# Patient Record
Sex: Male | Born: 1935 | Race: White | Hispanic: No
Health system: Midwestern US, Community
[De-identification: ages and names within clinical notes are randomized; demographics above are authoritative.]

## PROBLEM LIST (undated history)

## (undated) DIAGNOSIS — I2510 Atherosclerotic heart disease of native coronary artery without angina pectoris: Secondary | ICD-10-CM

## (undated) DIAGNOSIS — M1990 Unspecified osteoarthritis, unspecified site: Secondary | ICD-10-CM

## (undated) DIAGNOSIS — I10 Essential (primary) hypertension: Secondary | ICD-10-CM

## (undated) DIAGNOSIS — E785 Hyperlipidemia, unspecified: Secondary | ICD-10-CM

## (undated) DIAGNOSIS — E119 Type 2 diabetes mellitus without complications: Secondary | ICD-10-CM

## (undated) DIAGNOSIS — K219 Gastro-esophageal reflux disease without esophagitis: Secondary | ICD-10-CM

## (undated) DIAGNOSIS — M47816 Spondylosis without myelopathy or radiculopathy, lumbar region: Secondary | ICD-10-CM

## (undated) DIAGNOSIS — M48062 Spinal stenosis, lumbar region with neurogenic claudication: Secondary | ICD-10-CM

## (undated) DIAGNOSIS — M48061 Spinal stenosis, lumbar region without neurogenic claudication: Secondary | ICD-10-CM

## (undated) DIAGNOSIS — M179 Osteoarthritis of knee, unspecified: Secondary | ICD-10-CM

## (undated) DIAGNOSIS — R52 Pain, unspecified: Secondary | ICD-10-CM

## (undated) DIAGNOSIS — M4722 Other spondylosis with radiculopathy, cervical region: Secondary | ICD-10-CM

## (undated) DIAGNOSIS — M542 Cervicalgia: Secondary | ICD-10-CM

---

## 2011-10-14 NOTE — Progress Notes (Signed)
Attempted to contact patient.  No answer.  Message left at 11:40 AM on 10/14/2011.

## 2011-10-15 MED ORDER — LACTATED RINGERS IV SOLN
INTRAVENOUS | Status: DC
Start: 2011-10-15 — End: 2011-10-16

## 2011-10-15 MED ORDER — LIDOCAINE HCL 1 % (PF) IJ SOLN 2 ML
1 % (PF) | Freq: Once | INTRAMUSCULAR | Status: DC
Start: 2011-10-15 — End: 2011-10-16

## 2011-10-15 NOTE — Discharge Instructions (Signed)
[facility]          [phone]             [facility]          [phone]         [facility]          [phone]  POST EPIDURAL STEROID INJECTION    PATIENT INSTRUCTIONS:  1)  DIET   .  RESUME NORMAL DIET   .  RESUME ALL PRIOR MEDICATIONS  2)  ACTIVITY  *  DO NOT STAY ALONE FOR 4-6 HOURS AFTER THE PROCEDURE  .  REST TODAY  * DO NOT DRIVE TODAY AND 24 HOURS IF YOU RECEIVED SEDATION.  IF YOU ARE   SEEN DRIVING DURING THIS TIME THE PROPER AUTHORITIES WILL BE NOTIFIED.  * DO NOT SIGN ANY LEGAL DOCUMENTS, MAKE ANY MAJOR DECISIONS, OR BE INVOLVED IN WORK DECISIONS FOR THE REMAINDER OF THE DAY.   Marland Kitchen  RESUME NORMAL ACTIVITIES.  DO NOT OVER EXERT YOURSELF.  .  SHOWER OR BATHE AS NORMAL  3)  SITE CARE   .  MAY USE ICE TO SITE IF NEEDED   *  KEEP SITE DRY FOR 12 HOURS.  REMOVE BAND-AID THE FOLLOWING DAY  .  OBSERVE PUNCTURE SITE FOR SIGNS OF INFECTION (REDNESS, WARMTH, SWELLING, PURULENT DRAINAGE, INCREASED TENDERNESS OR FEVER GREATER THAN 101)  .  REPORT SIGNS OF INFECTION TO THE PHYSICIAN  4)  EXPECTED SIDE EFFECTS   .  FLUID RETENTION   .  NERVOUS ENERGY   .  SLEEPLESSNESS   .  MUSCLE SPASMS  * TEMPORARY WEAKNESS/TINGLING/NUMBNESS IN THE EXTREMITIES   . PAIN AT INJECTION SITE   .  DROWSINESS  .  POSSIBLE ELEVATION OF BLOOD SUGARS IF YOU ARE DIABETIC  5)  TO REACH DR. VALENTIN  *  CALL IF SYMPTOMS WORSEN SEVERELY OR FOR A SEVERE HEADACHE THAT WORSENS WHEN UPRIGHT     .  CALL IF YOU DEVELOP A FEVER GREATER THAN 101  .  CALL [phone] TO MAKE A FOLLOW UP APPOINTMENT WITH DR. Eduardo Osier   .  CALL REFERRING DOCTOR IF ANY PROBLEMS   6)  ADDITIONAL INSTRUCTIONS  .  IT MAY TAKE 24 TO 36 HOURS TO FEEL IMPROVEMENT    I HAVE RECEIVED AND UNDERSTAND THESE INSTRUCTIONS:   [facility]          [phone]  POST EPIDURAL STEROID INJECTION    PATIENT INSTRUCTIONS:  1)  DIET   .  RESUME NORMAL DIET   .  RESUME ALL PRIOR  MEDICATIONS  2)  ACTIVITY  *  DO NOT STAY ALONE FOR 4-6 HOURS AFTER THE PROCEDURE  .  REST TODAY  * DO NOT DRIVE TODAY AND 24 HOURS IF YOU RECEIVED SEDATION.  IF YOU ARE   SEEN DRIVING DURING THIS TIME THE PROPER AUTHORITIES WILL BE NOTIFIED.  * DO NOT SIGN ANY LEGAL DOCUMENTS, MAKE ANY MAJOR DECISIONS, OR BE INVOLVED IN WORK DECISIONS FOR THE REMAINDER OF THE DAY.   Marland Kitchen  RESUME NORMAL ACTIVITIES.  DO NOT OVER EXERT YOURSELF.  .  SHOWER OR BATHE AS NORMAL  3)  SITE CARE   .  MAY USE ICE TO SITE IF NEEDED   *  KEEP SITE DRY FOR 12 HOURS.  REMOVE BAND-AID THE FOLLOWING DAY  .  OBSERVE PUNCTURE SITE FOR SIGNS OF INFECTION (REDNESS, WARMTH, SWELLING,  PURULENT DRAINAGE, INCREASED TENDERNESS OR FEVER GREATER THAN 101)  .  REPORT SIGNS OF INFECTION TO THE PHYSICIAN  4)  EXPECTED SIDE EFFECTS   .  FLUID RETENTION   .  NERVOUS ENERGY   .  SLEEPLESSNESS   .  MUSCLE SPASMS  * TEMPORARY WEAKNESS/TINGLING/NUMBNESS IN THE EXTREMITIES   . PAIN AT INJECTION SITE   .  DROWSINESS  .  POSSIBLE ELEVATION OF BLOOD SUGARS IF YOU ARE DIABETIC  5)  TO REACH DR. VALENTIN  *  CALL IF SYMPTOMS WORSEN SEVERELY OR FOR A SEVERE HEADACHE THAT WORSENS WHEN UPRIGHT     .  CALL IF YOU DEVELOP A FEVER GREATER THAN 101  .  CALL [phone] TO MAKE A FOLLOW UP APPOINTMENT WITH DR. Eduardo OsierVALENTIN   .  CALL REFERRING DOCTOR IF ANY PROBLEMS   6)  ADDITIONAL INSTRUCTIONS  .  IT MAY TAKE 24 TO 36 HOURS TO FEEL IMPROVEMENT    I HAVE RECEIVED AND UNDERSTAND THESE INSTRUCTIONS:

## 2011-10-15 NOTE — Progress Notes (Signed)
1354 Report received from T. Deavers. VSS. Denies complaint of pain. Cervical site is unremarkable. Declined offer for drink. Resting. Will continue to monitor for 30 min per protocol.

## 2011-10-15 NOTE — H&P (Signed)
HISTORY AND PHYSICAL/PRE-SEDATION ASSESSMENT    Patient:  Jordan Gutierrez, Jordan Gutierrez   DOB:  [birth year]  Medical Record No.:  [id number]   Date:  10/15/2011  Physician:  Donnal Moat, M.D.    Nursing History and Physical reviewed and agreed upon.      Additional findings:    Allergies:  Review of patient's allergies indicates no known allergies.    Home Medications:    Prior to Admission medications    Medication Sig Start Date End Date Taking? Authorizing Provider   METFORMIN HCL PO Take  by mouth. TAKE 2 TABLETS TWICE A DAY    Yes Historical Provider, MD   escitalopram (LEXAPRO) 10 MG tablet Take 10 mg by mouth daily.     Yes Historical Provider, MD   ZOLPIDEM TARTRATE PO Take  by mouth nightly.     Yes Historical Provider, MD       Vitals:  BP 137/73  Pulse 79  Temp(Src) 96.8 Demitrios Molyneux (36 C) (Temporal)  Resp 16  Ht 5\' 10"  (1.778 m)  Wt 185 lb (83.915 kg)  BMI 26.54 kg/m2  SpO2 95%      PHYSICAL EXAM:  HENT: Airway patent and reviewed  Cardiovascular: Normal rate, regular rhythm, normal heart sounds.   Pulmonary/Chest: No wheezes. No rhonchi. No rales.   Abdominal: Soft. Bowel sounds are normal. No distension.    ASA CLASS:         []    I. Normal, healthy adult           [x]    II.  Mild systemic disease            []    III.  Severe systemic disease      Sedation plan:   [x]   Local              []   Minimal                  []   General anesthesia    Patient's condition acceptable for planned procedure/sedation.   Post Procedure Plan   Return to same level of care   ______________________     The risks and benefits as well as alternatives to the procedure have been discussed with the patient and or family.  The patient and or next of kin understands and agrees to proceed.    Donnal Moat, M.D.

## 2011-10-15 NOTE — Op Note (Signed)
Patient:  Jordan, Gutierrez   Medical Record #:  [id number]   Date:  10/15/2011  Physician:  Donnal Moat, M.D.    Pre-op diagnosis:  Cervical spondylosis  Post-op diagnosis:  same  Procedure:   Left C7/T1 cervical interlaminar epidural injection with flouroscopic guidance  Anesthesia: local  Procedure Note:    The patient was admitted through pre-op and written consent was obtained.  The patient was advised of the risks and benefits of the procedure, including but not limited to the following: bleeding, pain, infection, temporary paralysis, nerve damage and spinal headache.  The patient was given the opportunity to ask questions.  There were no contraindications for this procedure.    The appropriate area was prepped and draped in a sterile fashion.  Landmarks were identified and marked.  The skin and soft tissues were anesthetized with 1% lidocaine.  A 22G 3.5inch Touhy needle was advanced to the C7/T1 interlaminar space using fluoroscopic guidance with ideal needle tip confirmed by multiple views.  Injection of contrast showed appropriate needle placement.  There were no signs of intravascular or intrathecal injection.    12 mg celestone were then injected.     There were no complications and the patient tolerated the procedure well.  The patient was transferred to the recovery area and monitored.  Discharge instructions were given.  The patient is to contact me for any post-procedure concerns.  The patient is to follow up as scheduled.    LOR 2cm    Donnal Moat, MD

## 2011-10-15 NOTE — Progress Notes (Addendum)
Carey Jelus RT        [address]      Celestone 2ml  Lidocaine 1% plain 2ml  Omnipaque 240mg /ml  2ml

## 2012-02-26 NOTE — Patient Instructions (Signed)
PATIENT INSTRUCTED TO FOLLOW DR Shelly Rubenstein INSTRUCTIONS

## 2012-03-03 LAB — POCT GLUCOSE: POC Glucose: 144 mg/dl — ABNORMAL HIGH (ref 70–99)

## 2012-03-03 MED ORDER — BETAMETHASONE SOD PHOS & ACET 6 (3-3) MG/ML IJ SUSP
6 (3-3) MG/ML | INTRAMUSCULAR | Status: AC
Start: 2012-03-03 — End: ?

## 2012-03-03 MED FILL — CELESTONE SOLUSPAN 6 (3-3) MG/ML IJ SUSP: 6 (3-3) MG/ML | INTRAMUSCULAR | Qty: 5

## 2012-03-03 NOTE — Progress Notes (Signed)
NURSING CARE PLANS FOLLOWED        Potential for anxiety-decrease anxiety-allow patient to verbalize  Potential for infection-no infection-proper infection controls  Potential for fall-no fall-assess for drowsiness/ numbness.  Potential for deep sedation-no deep sedation-know maximum allowable dose, adverse reactions, and nursing considerations. Assess VS and LOC

## 2012-03-03 NOTE — Op Note (Signed)
Patient:  Jordan Gutierrez, Jordan Gutierrez   Medical Record #:  [id number]   Date:  03/03/2012  Physician:  Donnal Moat, M.D.    Pre-op diagnosis:  Cervical spondylosis, cervical radiculopathy  Post-op diagnosis:  same  Procedure:   C7/T1 cervical interlaminar epidural injection with flouroscopic guidance  Anesthesia: local  Procedure Note:    The patient was admitted through pre-op and written consent was obtained.  The patient was advised of the risks and benefits of the procedure, including but not limited to the following: bleeding, pain, infection, temporary paralysis, nerve damage and spinal headache.  The patient was given the opportunity to ask questions.  There were no contraindications for this procedure.    The appropriate area was prepped and draped in a sterile fashion.  Landmarks were identified and marked.  The skin and soft tissues were anesthetized with 1% lidocaine.  A 22G 3.5inch Touhy needle was advanced to the C7/T1 interlaminar space using fluoroscopic guidance with ideal needle tip confirmed by multiple views.  Injection of contrast showed appropriate needle placement.  There were no signs of intravascular or intrathecal injection.    12 mg celestone were then injected.     There were no complications and the patient tolerated the procedure well.  The patient was transferred to the recovery area and monitored.  Discharge instructions were given.  The patient is to contact me for any post-procedure concerns.  The patient is to follow up as scheduled.    LOR 3 cm    Donnal Moat, MD

## 2012-03-03 NOTE — Progress Notes (Signed)
Discharged home via ambulatory in good condition.  Verbalizes understanding of discharge instructions and written instructions also given to patient.  No changes to previous assessment.  Instructions To driver .

## 2012-03-03 NOTE — Discharge Instructions (Signed)
[facility]          [phone]  POST EPIDURAL STEROID INJECTION    PATIENT INSTRUCTIONS:  1)  DIET   .  RESUME NORMAL DIET   .  RESUME ALL PRIOR MEDICATIONS  2)  ACTIVITY  *  DO NOT STAY ALONE FOR 4-6 HOURS AFTER THE PROCEDURE  .  REST TODAY  * DO NOT DRIVE TODAY AND 24 HOURS IF YOU RECEIVED SEDATION.  IF YOU ARE   SEEN DRIVING DURING THIS TIME THE PROPER AUTHORITIES WILL BE NOTIFIED.  * DO NOT SIGN ANY LEGAL DOCUMENTS, MAKE ANY MAJOR DECISIONS, OR BE INVOLVED IN WORK DECISIONS FOR THE REMAINDER OF THE DAY.   .  RESUME NORMAL ACTIVITIES.  DO NOT OVER EXERT YOURSELF.  .  SHOWER OR BATHE AS NORMAL  3)  SITE CARE   .  MAY USE ICE TO SITE IF NEEDED   *  KEEP SITE DRY FOR 12 HOURS.  REMOVE BAND-AID THE FOLLOWING DAY  .  OBSERVE PUNCTURE SITE FOR SIGNS OF INFECTION (REDNESS, WARMTH, SWELLING, PURULENT DRAINAGE, INCREASED TENDERNESS OR FEVER GREATER THAN 101)  .  REPORT SIGNS OF INFECTION TO THE PHYSICIAN  4)  EXPECTED SIDE EFFECTS   .  FLUID RETENTION   .  NERVOUS ENERGY   .  SLEEPLESSNESS   .  MUSCLE SPASMS  * TEMPORARY WEAKNESS/TINGLING/NUMBNESS IN THE EXTREMITIES   . PAIN AT INJECTION SITE   .  DROWSINESS  .  POSSIBLE ELEVATION OF BLOOD SUGARS IF YOU ARE DIABETIC  5)  TO REACH DR. VALENTIN  *  CALL IF SYMPTOMS WORSEN SEVERELY OR FOR A SEVERE HEADACHE THAT WORSENS WHEN UPRIGHT     .  CALL IF YOU DEVELOP A FEVER GREATER THAN 101  .  CALL [phone] TO MAKE A FOLLOW UP APPOINTMENT WITH DR. VALENTIN   .  CALL REFERRING DOCTOR IF ANY PROBLEMS   6)  ADDITIONAL INSTRUCTIONS  .  IT MAY TAKE 24 TO 36 HOURS TO FEEL IMPROVEMENT    I HAVE RECEIVED AND UNDERSTAND THESE INSTRUCTIONS:

## 2012-03-03 NOTE — Progress Notes (Signed)
Pt arrived in pacu on cart , denies pain or numbness and tingling in lower extremities. Received report from Helane Gunther RN. Admitted to phase 2

## 2012-03-03 NOTE — H&P (Signed)
HISTORY AND PHYSICAL/PRE-SEDATION ASSESSMENT    Patient:  Jordan Gutierrez, Jordan Gutierrez   DOB:  1929/11/01  Medical Record No.:  [id number]   Date:  03/03/2012  Physician:  Donnal Moat, M.D.    Nursing History and Physical reviewed and agreed upon.      Additional findings:    Allergies:  Review of patient's allergies indicates no known allergies.    Home Medications:    Prior to Admission medications    Medication Sig Start Date End Date Taking? Authorizing Provider   fentaNYL (DURAGESIC) 50 MCG/HR Place 1 patch onto the skin every 72 hours.   Yes Historical Provider, MD   finasteride (PROSCAR) 5 MG tablet Take 5 mg by mouth daily.   Yes Historical Provider, MD   METFORMIN HCL PO Take  by mouth. TAKE 2 TABLETS TWICE A DAY    Yes Historical Provider, MD   escitalopram (LEXAPRO) 10 MG tablet Take 10 mg by mouth daily.     Yes Historical Provider, MD   ZOLPIDEM TARTRATE PO Take  by mouth nightly.     Yes Historical Provider, MD       Vitals:  BP 134/83   Pulse 81   Temp(Src) 98.2 ??Eryck Negron (36.8 ??C) (Temporal)   Resp 18   Ht 5\' 10"  (1.778 m)   Wt 180 lb (81.647 kg)   BMI 25.83 kg/m2   SpO2 96%      PHYSICAL EXAM:  HENT: Airway patent and reviewed  Cardiovascular: Normal rate, regular rhythm, normal heart sounds.   Pulmonary/Chest: No wheezes. No rhonchi. No rales.   Abdominal: Soft. Bowel sounds are normal. No distension.    ASA CLASS:         []    I. Normal, healthy adult           [x]    II.  Mild systemic disease            []    III.  Severe systemic disease      Sedation plan:   [x]   Local              []   Minimal                  []   General anesthesia    Patient's condition acceptable for planned procedure/sedation.   Post Procedure Plan   Return to same level of care   ______________________     The risks and benefits as well as alternatives to the procedure have been discussed with the patient and or family.  The patient and or next of kin understands and agrees to proceed.    Donnal Moat, M.D.

## 2012-03-03 NOTE — Progress Notes (Addendum)
Celestone 6mg /ml-12mg /51ml  Omnipaque 240-69ml    RT-C Jelus  ST-T [facility]

## 2012-09-09 MED ORDER — FENTANYL 50 MCG/HR TD PT72
50 MCG/HR | MEDICATED_PATCH | TRANSDERMAL | Status: DC
Start: 2012-09-09 — End: 2013-09-15

## 2012-09-09 MED ORDER — GABAPENTIN 300 MG PO CAPS
300 MG | ORAL_CAPSULE | ORAL | Status: DC
Start: 2012-09-09 — End: 2015-01-03

## 2012-09-09 MED ORDER — HYDROCODONE-ACETAMINOPHEN 7.5-325 MG PO TABS
ORAL_TABLET | Freq: Four times a day (QID) | ORAL | Status: DC | PRN
Start: 2012-09-09 — End: 2013-09-15

## 2012-09-09 NOTE — Progress Notes (Signed)
Follow up: SPINE    CHIEF COMPLAINT:    Chief Complaint   Patient presents with   ??? Neck Pain       HISTORY OF PRESENT ILLNESS:                The patient is a 78 y.o. male here to follow up medication maintenance .  Pain left posterior shoulder and neck.  Pain level 6/10 with medication, 10/10 without.  Asking if he can go up on the hydrocodone.  No significant side effects.  Also on gabapentin 300 q. Day for   foot issue    Meds: Duragesic 50 mcg patch q.48 hours, Vicodin 7.5 mg q.6 hours p.r.n. pain    Pain location: left neck and shoulder  Function: still walks independently and functions in the community  Pain Scale: 1-10  With Meds 6   Pain Scale: 1-10 Without Meds 10    Side Effects:     Past Medical History:   Past Medical History   Diagnosis Date   ??? Diabetes mellitus    ??? Cancer      PROSTATE CANCER   ??? Pancreatitis 10/15/11        REVIEW OF SYSTEMS:   CONSTITUTIONAL: Denies unexplained weight loss, fevers, chills or fatigue  NEUROLOGIC: Denies tremors or seizures         PHYSICAL EXAM:    Vitals: Blood pressure 130/80, pulse 80, height 5\' 10"  (1.778 m), weight 180 lb (81.647 kg).    GENERAL EXAM:  ?? General Apparence: Patient is adequately groomed with no evidence of malnutrition.  ?? Orientation: The patient is oriented to time, place and person.   ?? Mood & Affect:The patient's mood and affect are appropriate   ?? Gait & station: normal, patient ambulates without assistance  ?? Vascular: Examination reveals no swelling tenderness in upper or lower extremities.    ?? Lymphatic: The lymphatic examination bilaterally reveals all areas to be without enlargement or induration  ?? Sensation: Sensation is intact without deficit  ?? Coordination/Balance: Good coordination     CERVICAL EXAMINATION:  ?? Inspection: Local inspection shows no step-off or bruising.  Cervical alignment is normal.     ?? Palpation: No evidence of tenderness at the midline, and trapezius.  Paraspinal tenderness is present. There is no step-off  or paraspinal spasm.   ?? Range of Motion: moderate loss of  ?? Strength: 5/5 bilateral upper extremities   ?? Special Tests:    ??   Spurling's, L'Hermitte's & Hoffman's negative bilaterally.   ??   Hawkins and Impingement tests are negative bilaterally.   ??  Cubital and Carpal tunnel Tinel's negative bilaterally.      ?? Skin:There are no rashes, ulcerations or lesions in right & left upper extremities.  ?? Reflexes: Bilaterally triceps, biceps and brachioradialis are 2+.  Clonus absent bilaterally at the feet.   ?? Gait & station: normal, patient ambulates without assistance     ?? Additional Examinations:  Moderate loss of shoulder flexion and abduction      ?? RIGHT UPPER EXTREMETY:  Inspection/examination of the right upper extremity does not show any tenderness, deformity or injury.  There is no gross instability.  There are no rashes, ulcerations or lesions. Strength and tone are normal.  ?? LEFT UPPER EXTREMETY: Inspection/examination of the left upper extremity does not show any tenderness, deformity or injury.  There is no gross instability.  There are no rashes, ulcerations or lesions. Strength and tone are normal.  Diagnostic Testing:   Cervical MRI noted and reviewed in chart from 2004 showing multilevel spondylosis, right shoulder films reviewed showing a.c. arthropathy    Impression:  1) cervical spondylosis and left mechanical neck pain  Bilateral rotator cuff  2) opiate management-mild risk      Plan:  1) increase gabapentin to 300-600 mg q.6 hours  Duragesic 50 mcg patch q.a 48 hours can dispense#15 with 3 separate one month prescriptions  Vicodin 7.5 mg one q.i.d. P.r.n. Breakthrough pain 120 with 2 refills    The risks and use of maintenance opiate medication were reviewed.  These include the risk of tolerance, addition, and abuse.  Potential side effects were also discussed.   Medications are to be taken as prescribed and not escalated without prior agreement.   OARRS/KASPER reviewed.   Opiate  medications will only be prescribed through the office of Dr Carmon Ginsberg. Donato Schultz, M. D. unless notified otherwise.

## 2012-09-09 NOTE — Patient Instructions (Signed)
Continue medications, follow up 3 months

## 2012-09-16 NOTE — Progress Notes (Signed)
Chief Complaint  Shoulder Pain  right    History of Present Illness:  Jordan Gutierrez is a 78 y.o. male    Medical History  Patient's medications, allergies, past medical, surgical, social and family histories were reviewed and updated as appropriate.    Review of Systems  Pertinent items are noted in HPI.  Relevant review of systems reviewed and available in the patient's chart    Vital Signs  Filed Vitals:    09/16/12 1337   BP: 122/78   Pulse: 82       General Exam:   Constitutional: Patient is adequately groomed with no evidence of malnutrition    Shoulder Examination  Inspection:  No deformities    Palpation:  No significant pain to palpation    Rang of Motion:  Full passive range of motion active range of motion is to 120 without pain    Strength:  His strength is actually very good with supraspinatus isolation internal and external rotation against resistance    Special Tests:  Unremarkable    Skin: There are no rashes, ulcerations or lesions.    Additional Comments:     Additional Examinations:  Left Upper Extremity: Examination of the left upper extremity does not show any tenderness, deformity or injury.  Range of motion is unremarkable.  There is no gross instability.  There are no rashes, ulcerations or lesions.  Strength and tone are normal.      Diagnostic Test Findings:      Assessment:  Subacromial impingement osteoarthritis    Impression: Same    Office Procedures:  No orders of the defined types were placed in this encounter.       Treatment Plan:  As tolerated followup p.r.n.

## 2012-09-30 NOTE — Progress Notes (Addendum)
Chief Complaint  Shoulder Pain  right    History of Present Illness:  Jordan Gutierrez is a 78 y.o. male    Medical History  Patient's medications, allergies, past medical, surgical, social and family histories were reviewed and updated as appropriate.    Review of Systems  Pertinent items are noted in HPI.  Relevant review of systems reviewed and available in the patient's chart    Vital Signs  There were no vitals filed for this visit.    General Exam:   Constitutional: Patient is adequately groomed with no evidence of malnutrition    Shoulder Examination  Inspection:  No swelling no deformity    Palpation:  Pain lateral deltoid and anterior    Rang of Motion:  Good active and passive range of motion with crepitus    Strength:  His strength is actually very good with internal/external rotation against resistance and supraspinatus isolation against resistance    Special Tests:  Positive Neer positive Hawkins    Skin: There are no rashes, ulcerations or lesions.    Additional Comments:     Additional Examinations:  Left Upper Extremity: Examination of the left upper extremity does not show any tenderness, deformity or injury.  Range of motion is unremarkable.  There is no gross instability.  There are no rashes, ulcerations or lesions.  Strength and tone are normal.      Diagnostic Test Findings:      Assessment:  Right shoulder impingement    Impression: Same    Office Procedures:  No orders of the defined types were placed in this encounter.       Treatment Plan:  Today under sterile conditions and at all prepped and Betadine prep I injected his right shoulder with 3cc of Celestone and 7cc of Marcaine 0.5% he tolerated this very well.

## 2012-09-30 NOTE — Progress Notes (Signed)
09/30/2012 3:59 PM         NDC: [id number]    Lot Number: [id number]    Comments:

## 2012-12-17 MED ORDER — HYDROCODONE-ACETAMINOPHEN 7.5-325 MG PO TABS
ORAL_TABLET | Freq: Four times a day (QID) | ORAL | Status: DC | PRN
Start: 2012-12-17 — End: 2013-09-15

## 2012-12-17 MED ORDER — FENTANYL 50 MCG/HR TD PT72
50 MCG/HR | MEDICATED_PATCH | TRANSDERMAL | Status: DC
Start: 2012-12-17 — End: 2013-09-15

## 2012-12-17 NOTE — Progress Notes (Signed)
Follow up: SPINE    CHIEF COMPLAINT:  Chronic neck and shoulder pain  Chief Complaint   Patient presents with   ??? Neck Pain       HISTORY OF PRESENT ILLNESS:                The patient is a 78 y.o. male here to follow up medication maintenance for chronic left posterior shoulder and neck pain.  He describes episodic aching left neck and posterior shoulder pain increased with prolonged activity/ROM.  Substantial relief with Duragesic-50 q48 & Norco 7.5/325 QID.  His PCP he recently discontinued gabapentin and has started him on Lyrica 50 BID.  He feels that he is much more functional with the medication.  He denies any side effects.  Currently denies any radiating arm pain, no numbness tingling or progressive weakness. Symptoms are stable     Past Medical History:   Past Medical History   Diagnosis Date   ??? Diabetes mellitus    ??? Cancer      PROSTATE CANCER   ??? Pancreatitis 10/15/11        REVIEW OF SYSTEMS:   CONSTITUTIONAL: Denies unexplained weight loss, fevers, chills or fatigue  NEUROLOGIC: Denies tremors or seizures         PHYSICAL EXAM:    Vitals: Blood pressure 165/97, pulse 77, height 5\' 10"  (1.778 m), weight 180 lb (81.647 kg).    GENERAL EXAM:  ?? General Apparence: Patient is adequately groomed with no evidence of malnutrition.  ?? Orientation: The patient is oriented to time, place and person.   ?? Mood & Affect:The patient's mood and affect are appropriate   ?? Gait & station: normal, patient ambulates without assistance  ?? Vascular: Examination reveals no swelling tenderness in upper or lower extremities.   ?? Lymphatic: The lymphatic examination bilaterally reveals all areas to be without enlargement or induration  ?? Sensation: Sensation is intact without deficit  ?? Coordination/Balance: Good coordination     CERVICAL EXAMINATION:  ?? Inspection: Local inspection shows no step-off or bruising.  Cervical alignment is normal.     ?? Palpation: No evidence of tenderness at the midline, and trapezius.   Paraspinal tenderness is present. There is no step-off or paraspinal spasm.   ?? Range of Motion: mildly limited flexion, very limited extension   ?? Strength: 5/5 bilateral upper extremities   ?? Special Tests:    ??   Spurling's, L'Hermitte's & Hoffman's negative bilaterally.   ??   Hawkins and Impingement tests mildly positive left.   ??  Cubital and Carpal tunnel Tinel's negative bilaterally.      ?? Skin:There are no rashes, ulcerations or lesions in right & left upper extremities.  ?? Reflexes: Bilaterally triceps, biceps and brachioradialis are 1+.  Clonus absent bilaterally at the feet.   ?? Gait & station: normal, patient ambulates without assistance     ?? Additional Examinations:         ?? RIGHT UPPER EXTREMETY:  Inspection/examination of the right upper extremity does not show any tenderness, deformity or injury. Range of motion is unremarkable. There is no gross instability.  There are no rashes, ulcerations or lesions. Strength and tone are normal.  ?? LEFT UPPER EXTREMETY: Inspection/examination of the left upper extremity does not show any tenderness, deformity or injury. Range of motion is unremarkable. There is no gross instability.  There are no rashes, ulcerations or lesions. Strength and tone are normal.    Diagnostic Testing:   Cervical  MRI noted in chart from 2004 showing multilevel spondylosis    Impression:  1) chronic left mechanical neck pain, multilevel spondylosis  2) chronic shoulder pain, s/p sx  3) H/o prostate cancer  4) Opioid maintenance     Plan:  1) refill Duragesic 50 mcg patch apply 1 q48hrs #15, 3 separate one month scripts given  2) refill Norco 7.5/325 i po QID #120 with 2 refills  3) The risks and use of maintenance opiate medication were reviewed.  These include the risk of tolerance, addition, and abuse.  Potential side effects were also discussed.   Medications are to be taken as prescribed and not escalated without prior agreement.  OARRS/KASPER reviewed.   Opiate medications will  only be prescribed through the office of Dr Carmon Ginsberg. Donato Schultz, M. D. unless notified otherwise  4) Cayton Cuevas/u 73mo for medication     Marene Lenz, PA-C scribing for Dr. Eduardo Osier

## 2013-01-04 NOTE — Addendum Note (Signed)
Addended by: Wende Mott on: 01/04/2013 02:45 PM     Modules accepted: Orders

## 2013-01-05 NOTE — Progress Notes (Signed)
[facility] NDC number is 0517-0720-01

## 2013-02-10 MED ORDER — TRAMADOL-ACETAMINOPHEN 37.5-325 MG PO TABS
37.5-325 MG | ORAL_TABLET | Freq: Three times a day (TID) | ORAL | Status: DC | PRN
Start: 2013-02-10 — End: 2013-09-15

## 2013-02-10 NOTE — Addendum Note (Signed)
Addended byDonnie Mesa on: 02/10/2013 04:40 PM     Modules accepted: Orders

## 2013-02-10 NOTE — Progress Notes (Signed)
Chief Complaint  Shoulder Pain  right    History of Present Illness:  Jordan Gutierrez is a 79 y.o. male    Medical History  Patient's medications, allergies, past medical, surgical, social and family histories were reviewed and updated as appropriate.    Review of Systems  Pertinent items are noted in HPI.  Relevant review of systems reviewed and available in the patient's chart    Vital Signs  Filed Vitals:    02/10/13 1502   BP: 119/80       General Exam:   Constitutional: Patient is adequately groomed with no evidence of malnutrition    Shoulder Examination  Inspection:  No deformity no swelling    Palpation:  Some mild deltoid pain to palpation    Rang of Motion:  Passive motion to 130 active to 120 external rotation is limited to about 75??    Strength:  Good strength of internal and external rotation against resistance supraspinatus isolation is weaker on the right than the left mostly because of pain    Special Tests:  Negative Neer positive Hawkins radial ulnar and median nerve function is intact capillary refill is brisk sensation is intact I don't think he has a full-thickness rotator cuff tear    Skin: There are no rashes, ulcerations or lesions.    Additional Comments:     Additional Examinations:  Left Upper Extremity: Examination of the left upper extremity does not show any tenderness, deformity or injury.  Range of motion is unremarkable.  There is no gross instability.  There are no rashes, ulcerations or lesions.  Strength and tone are normal.      Diagnostic Test Findings:      Assessment:  Right shoulder subacromial impingement possible small rotator cuff tear    Impression: Same    Office Procedures:I discussed in detail the risks, benefits and complications of an injection which included but are not limited to infection, skin reactions, hot swollen joint, and anaphylaxis with the patient. The patient verbalized understanding and gave informed consent for the injection. The patient's skin was  prepped using sterile alcohol solution. A sterile 22-gauge needle was inserted into the subacromial space of the right shoulder and a mixture of 3ml of 6mg /ml [facility], 7mL of 0.5% Marcaine  was injected under sterile technique. The needle was withdrawn and the puncture site sealed with a Band-Aid. The patient tolerated the injection well. The patient was instructed to call the office immediately if there is any pain, redness, warmth, fever, or chills.  No orders of the defined types were placed in this encounter.       Treatment Plan:  Injection and follow up in a few weeks

## 2013-02-10 NOTE — Progress Notes (Signed)
02/10/2013 4:40 PM         NDC: 6962-9528-41    Lot Number: [id number]    Comments:

## 2013-02-10 NOTE — Addendum Note (Signed)
Addended by: Candi Leash A on: 02/10/2013 03:32 PM     Modules accepted: Orders

## 2013-03-12 MED ORDER — FENTANYL 50 MCG/HR TD PT72
50 MCG/HR | MEDICATED_PATCH | TRANSDERMAL | Status: DC
Start: 2013-03-12 — End: 2013-09-15

## 2013-03-12 MED ORDER — HYDROCODONE-ACETAMINOPHEN 7.5-325 MG PO TABS
ORAL_TABLET | Freq: Four times a day (QID) | ORAL | Status: DC
Start: 2013-03-12 — End: 2013-09-15

## 2013-03-12 NOTE — Progress Notes (Signed)
Follow up: SPINE    CHIEF COMPLAINT:  Neck and shoulder pain, medication maintenance  Chief Complaint   Patient presents with   ??? Shoulder Pain       HISTORY OF PRESENT ILLNESS:                The patient is a 79 y.o. male here to follow up medication maintenance chronic left posterior shoulder and neck pain. He describes episodic aching left neck and posterior shoulder pain increased with prolonged activity/ROM. Substantial relief with Duragesic-50 q48 & Norco 7.5/325 QID. His PCP he recently discontinued gabapentin and has started him on Lyrica 50 BID. He feels that he is much more functional with the medication. He denies any side effects. Currently denies any radiating arm pain, no numbness tingling or progressive weakness. Symptoms are stable       Pain location: neck & bilateral shoulders   Function: able to stay more active takes care of wife   Pain Scale: 1-10  With Meds 4/10  Pain Scale: 1-10 Without Meds 6/10     Side Effects: denies     Past Medical History:   Past Medical History   Diagnosis Date   ??? Diabetes mellitus    ??? Cancer      PROSTATE CANCER   ??? Pancreatitis 10/15/11        REVIEW OF SYSTEMS:   CONSTITUTIONAL: Denies unexplained weight loss, fevers, chills or fatigue  NEUROLOGIC: Denies tremors or seizures         PHYSICAL EXAM:    Vitals: Blood pressure 142/88, pulse 91, height 5\' 10"  (1.778 m), weight 180 lb (81.647 kg).    GENERAL EXAM:  ?? General Apparence: Patient is adequately groomed with no evidence of malnutrition.  ?? Orientation: The patient is oriented to time, place and person.   ?? Mood & Affect:The patient's mood and affect are appropriate   ?? Gait & station: normal, patient ambulates without assistance  ?? Vascular: Examination reveals no swelling tenderness in upper or lower extremities.    ?? Lymphatic: The lymphatic examination bilaterally reveals all areas to be without enlargement or induration  ?? Sensation: Sensation is intact without deficit  ?? Coordination/Balance: Good  coordination     CERVICAL EXAMINATION:  ?? Inspection: Local inspection shows no step-off or bruising.  Cervical alignment is normal.     ?? Palpation: No evidence of tenderness at the midline, and trapezius.  Paraspinal tenderness is present. There is no step-off or paraspinal spasm.   ?? Range of Motion: moderately limited flexion and extension  ?? Strength: 5/5 bilateral upper extremities   ?? Special Tests:    ??   Spurling's, L'Hermitte's & Hoffman's negative bilaterally.   ??   Hawkins and Impingement tests are positive bilaterally.   ??  Cubital and Carpal tunnel Tinel's negative bilaterally.      ?? Skin:There are no rashes, ulcerations or lesions in right & left upper extremities.  ?? Reflexes: Bilaterally triceps, biceps and brachioradialis are 1+.  Clonus absent bilaterally at the feet.   ?? Gait & station: normal, patient ambulates without assistance     ?? Additional Examinations:         ?? RIGHT UPPER EXTREMETY:  Inspection/examination of the right upper extremity does not show any tenderness, deformity or injury. Range of motion is unremarkable. There is no gross instability.  There are no rashes, ulcerations or lesions. Strength and tone are normal.  ?? LEFT UPPER EXTREMETY: Inspection/examination of the left upper  extremity does not show any tenderness, deformity or injury. Range of motion is unremarkable. There is no gross instability.  There are no rashes, ulcerations or lesions. Strength and tone are normal.    Diagnostic Testing:   Cervical MRI noted in chart from 2004 showing multilevel spondylosis     Impression:   1) chronic left mechanical neck pain, multilevel spondylosis   2) chronic shoulder pain, s/p sx   3) H/o prostate cancer   4) Opioid maintenance     Plan:   1) refill Duragesic 50 mcg patch apply 1 q48hrs #15, 3 separate one month scripts given   2) refill Norco 7.5/325 i po QID #120 with 2 refills   3) The risks and use of maintenance opiate medication were reviewed. These include the risk of  tolerance, addition, and abuse. Potential side effects were also discussed.   Medications are to be taken as prescribed and not escalated without prior agreement.   OARRS/KASPER reviewed.   Opiate medications will only be prescribed through the office of Dr Carmon Ginsberg. Donato Schultz, M. D. unless notified otherwise   4) Emsley Custer/u 53mo for medication            Marene Lenz, PA-C scribing for Dr. Eduardo Osier

## 2013-03-12 NOTE — Patient Instructions (Addendum)
I have reviewed the provider's instructions with the patient, answering all questions to his satisfaction.

## 2013-06-09 MED ORDER — FENTANYL 50 MCG/HR TD PT72
50 MCG/HR | MEDICATED_PATCH | TRANSDERMAL | Status: DC
Start: 2013-06-09 — End: 2013-09-15

## 2013-06-09 MED ORDER — FENTANYL 50 MCG/HR TD PT72
50 MCG/HR | MEDICATED_PATCH | TRANSDERMAL | Status: DC
Start: 2013-06-09 — End: 2014-09-15

## 2013-06-09 MED ORDER — HYDROCODONE-ACETAMINOPHEN 7.5-325 MG PO TABS
ORAL_TABLET | Freq: Four times a day (QID) | ORAL | Status: DC | PRN
Start: 2013-06-09 — End: 2013-12-15

## 2013-06-09 NOTE — Progress Notes (Deleted)
Follow up: SPINE    CHIEF COMPLAINT:    Chief Complaint   Patient presents with   ??? Shoulder Pain       HISTORY OF PRESENT ILLNESS:                The patient is a 79 y.o. male here to follow up medication maintenance chronic shoulder and neck pain.  No weakness.     On duragesic 50cmg q48hr, Norco 7/5 qid prn      Function-Does the pain medication improve your ability to do:   ?? Personal care: Yes  ?? Housework: Yes   ?? Physical activity: Yes  ?? Social activity: Yes  ?? to work: Yes     Pain Scale: 1-10  With Meds:   4  Pain Scale: 1-10 Without Meds:6    Side Effects: none    Past Medical History:   Past Medical History   Diagnosis Date   ??? Diabetes mellitus    ??? Cancer      PROSTATE CANCER   ??? Pancreatitis 10/15/11        REVIEW OF SYSTEMS:   CONSTITUTIONAL: Denies unexplained weight loss, fevers, chills or fatigue  NEUROLOGIC: Denies tremors or seizures         PHYSICAL EXAM:    Vitals: Blood pressure 158/99, pulse 81, height 5\' 10"  (1.778 m), weight 180 lb (81.647 kg).    GENERAL EXAM:  ?? General Apparence: Patient is adequately groomed with no evidence of malnutrition.  ?? Orientation: The patient is oriented to time, place and person.   ?? Mood & Affect:The patient's mood and affect are appropriate   ?? Gait & station: normal, patient ambulates without assistance  ?? Vascular: Examination reveals no swelling tenderness in upper or lower extremities.    ?? Lymphatic: The lymphatic examination bilaterally reveals all areas to be without enlargement or induration  ?? Sensation: Sensation is intact without deficit  ?? Coordination/Balance: Good coordination     CERVICAL EXAMINATION:  ?? Inspection: Local inspection shows no step-off or bruising.  Cervical alignment is normal.     ?? Palpation: No evidence of tenderness at the midline, and trapezius.  Paraspinal tenderness is present. There is no step-off or paraspinal spasm.   ?? Range of Motion:   ?? Strength: 5/5 bilateral upper extremities   ?? Special Tests:    ??    Spurling's, L'Hermitte's & Hoffman's negative bilaterally.   ??   Hawkins and Impingement tests are negative bilaterally.   ??  Cubital and Carpal tunnel Tinel's negative bilaterally.      ?? Skin:There are no rashes, ulcerations or lesions in right & left upper extremities.  ?? Reflexes: Bilaterally triceps, biceps and brachioradialis are 2+.  Clonus absent bilaterally at the feet.   ?? Gait & station: normal, patient ambulates without assistance     ?? Additional Examinations:         ?? RIGHT UPPER EXTREMETY:  Inspection/examination of the right upper extremity does not show any tenderness, deformity or injury. Range of motion is unremarkable. There is no gross instability.  There are no rashes, ulcerations or lesions. Strength and tone are normal.  ?? LEFT UPPER EXTREMETY: Inspection/examination of the left upper extremity does not show any tenderness, deformity or injury. Range of motion is unremarkable. There is no gross instability.  There are no rashes, ulcerations or lesions. Strength and tone are normal.    LUMBAR/SACRAL EXAMINATION:  ?? Inspection: Local inspection shows no step-off or bruising.  Lumbar alignment is normal.  Sagittal and Coronal balance is neutral.      ?? Palpation:   No evidence of tenderness at the midline.  No tenderness bilaterally at the paraspinal or trochanters.  There is no step-off or paraspinal spasm.   ?? Range of Motion:     ?? Strength:   Strength testing is 5/5 in all muscle groups tested.   ?? Special Tests:   Straight leg raise and crossed SLR negative.  Leg length and pelvis level.  0 out of 5 Waddell's signs.        ?? Skin: There are no rashes, ulcerations or lesions.  ?? Reflexes: Reflexes are symmetrically 2+ at the patellar and ankle tendons.  Clonus absent bilaterally at the feet.  ?? Gait & station: normal, patient ambulates without assistance  ?? Pulses: Dorsalis pedis/poserior tibialis palpable bilaterally     ?? Additional Examinations:   ??   ?? RIGHT LOWER EXTREMETY:  Inspection/examination of the right lower extremity does not show any tenderness, deformity or injury. Range of motion is unremarkable. There is no gross instability.  There are no rashes, ulcerations or lesions. Strength and tone are normal.  ?? LEFT LOWER EXTREMETY:  Inspection/examination of the left lower extremity does not show any tenderness, deformity or injury. Range of motion is diminished in IR There is no gross instability. There are no rashes, ulcerations or lesions.  Strength and tone are normal.    Diagnostic Testing:     Cervical MRI noted in chart from 2004 showing multilevel spondylosis     Impression:   1) chronic left mechanical neck pain, multilevel spondylosis   2) chronic shoulder pain, s/p sx   3) H/o prostate cancer   4) Opioid maintenance       Plan:   1) refill Duragesic 50 mcg patch apply 1 q48hrs #15, 3 separate one month scripts given   2) refill Norco 7.5/325 i po QID #120 with 2 refills   3) The risks and use of maintenance opiate medication were reviewed. These include the risk of tolerance, addition, and abuse. Potential side effects were also discussed.   Medications are to be taken as prescribed and not escalated without prior agreement.   OARRS/KASPER reviewed.   Opiate medications will only be prescribed through the office of Dr Carmon Ginsberg. Donato Schultz, M. D. unless notified otherwise   4) Marayah Higdon/u 51mo for medication     Caileb Rhue. Donato Schultz

## 2013-06-09 NOTE — Progress Notes (Signed)
Follow up: SPINE    CHIEF COMPLAINT:    Chief Complaint   Patient presents with   ??? Shoulder Pain       HISTORY OF PRESENT ILLNESS:                The patient is a 79 y.o. male here to follow up medication maintenance for chronic left posterior shoulder and neck pain.  He reports episodic aching left neck and posterior shoulder pain, that is increased with any prolonged activity/rom.  She gets substantial relief Duragesic 1 q 48 and Norco 7.5/325 1 po QID.  He also takes Lyrica 50 BID through his PCP.  Currently denies any radiating arm pain, no numbness tingling or progressive weakness        Function-Does the pain medication improve your ability to do:   ?? Personal care: Yes  ?? Housework: Yes is able to help his wife at home  ?? Physical activity: Yes, he is able to tend to a large garden   ?? Social activity: Yes  ?? to work: n/a    Pain Scale: 1-10  With Meds:   4/10  Pain Scale: 1-10 Without Meds:  6/10    Side Effects:     Past Medical History:   Past Medical History   Diagnosis Date   ??? Diabetes mellitus    ??? Cancer      PROSTATE CANCER   ??? Pancreatitis 10/15/11        REVIEW OF SYSTEMS:   CONSTITUTIONAL: Denies unexplained weight loss, fevers, chills or fatigue  NEUROLOGIC: Denies tremors or seizures         PHYSICAL EXAM:    Vitals: Blood pressure 158/99, pulse 81, height 5\' 10"  (1.778 m), weight 180 lb (81.647 kg).    GENERAL EXAM:  ?? General Apparence: Patient is adequately groomed with no evidence of malnutrition.  ?? Orientation: The patient is oriented to time, place and person.   ?? Mood & Affect:The patient's mood and affect are appropriate   ?? Gait & station: normal, patient ambulates without assistance  ?? Vascular: Examination reveals no swelling tenderness in upper or lower extremities.    ?? Lymphatic: The lymphatic examination bilaterally reveals all areas to be without enlargement or induration  ?? Sensation: Sensation is intact without deficit  ?? Coordination/Balance: Good coordination      CERVICAL EXAMINATION:  ?? Inspection: Local inspection shows no step-off or bruising.  Cervical alignment is normal.     ?? Palpation: No evidence of tenderness at the midline, and trapezius.  Paraspinal tenderness is present. There is no step-off or paraspinal spasm.   ?? Range of Motion: moderate loss of flexion and extension  ?? Strength: 5/5 bilateral upper extremities   ?? Special Tests:    ??   Spurling's, L'Hermitte's & Hoffman's negative bilaterally.   ??   Hawkins and Impingement tests are negative bilaterally.   ??  Cubital and Carpal tunnel Tinel's negative bilaterally.      ?? Skin:There are no rashes, ulcerations or lesions in right & left upper extremities.  ?? Reflexes: Bilaterally triceps, biceps and brachioradialis are 1+.  Clonus absent bilaterally at the feet.   ?? Gait & station: normal, patient ambulates without assistance     ?? Additional Examinations:         ?? RIGHT UPPER EXTREMETY:  Inspection/examination of the right upper extremity does not show any tenderness, deformity or injury. Range of motion is unremarkable. There is no gross instability.  There are no rashes, ulcerations or lesions. Strength and tone are normal.  ?? LEFT UPPER EXTREMETY: Inspection/examination of the left upper extremity does not show any tenderness, deformity or injury. Range of motion is unremarkable. There is no gross instability.  There are no rashes, ulcerations or lesions. Strength and tone are normal.    Diagnostic Testing:     Cervical MRI noted in chart from 2004 showing multilevel spondylosis     Impression:   1) chronic left mechanical neck pain, multilevel spondylosis   2) chronic shoulder pain, s/p sx   3) H/o prostate cancer   4) Opioid maintenance     Plan:    1) 1) refill Duragesic 50 mcg patch apply 1 q48hrs #15, 3 separate one month scripts given   2) refill Norco 7.5/325 i po QID #120 with 2 refills  The risks and use of maintenance opiate medication were reviewed.  These include the risk of tolerance,  addition, and abuse.  Potential side effects were also discussed.   Medications are to be taken as prescribed and not escalated without prior agreement.  OARRS/KASPER reviewed.   Opiate medications will only be prescribed through the office of Dr Carmon Ginsberg. Donato Schultz, M. D. unless notified otherwise         Prerana Strayer. Donato Schultz

## 2013-06-09 NOTE — Patient Instructions (Signed)
Follow up in 3 months

## 2013-06-30 MED ORDER — TRAMADOL-ACETAMINOPHEN 37.5-325 MG PO TABS
ORAL_TABLET | Freq: Four times a day (QID) | ORAL | Status: AC | PRN
Start: 2013-06-30 — End: 2013-07-10

## 2013-06-30 NOTE — Addendum Note (Signed)
Addended by: Wende Mott on: 06/30/2013 03:39 PM     Modules accepted: Orders

## 2013-06-30 NOTE — Addendum Note (Signed)
Addended by: Janae Bridgeman on: 06/30/2013 03:48 PM     Modules accepted: Orders

## 2013-06-30 NOTE — Progress Notes (Signed)
Chief Complaint  Shoulder Pain  right    History of Present Illness:  Jordan Gutierrez is a 79 y.o. male    Medical History  Patient's medications, allergies, past medical, surgical, social and family histories were reviewed and updated as appropriate.    Review of Systems  Pertinent items are noted in HPI.  Relevant review of systems reviewed and available in the patient's chart    Vital Signs  There were no vitals filed for this visit.    General Exam:   Constitutional: Patient is adequately groomed with no evidence of malnutrition    Shoulder Examination  Inspection:  Right shoulder pain     Palpation:  Palpation reveals lateral deltoid pain    Rang of Motion:  Passively I get him to 140 actively to 120    Strength:  His shoulder strength throughout his about 3+ over 5 her external rotation and supraspinatus isolation 5 over 5 for internal rotation    Special Tests:  No neck pain range of motion radial ulnar and median nerve function is intact capillary refill is brisk sensation is intact     Skin: There are no rashes, ulcerations or lesions.    Additional Comments:     Additional Examinations:  Neck: Examination of the neck does not show any tenderness, deformity or injury.  Range of motion is unremarkable.  There is no gross instability.  There are no rashes, ulcerations or lesions.  Strength and tone are normal.      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 The patient verbalized understanding and gave informed consent for the injection. The patient's skin was prepped using sterile alcohol solution. A sterile 22-gauge needle was inserted into the subacromial space of the right  shoulder and a mixture of 3ml of 6mg /ml Celestone, 7mL of 0.5% Marcaine  was injected under sterile technique. The needle was withdrawn and the puncture site sealed with a Band-Aid. The patient tolerated the injection well. The patient was instructed to call the office immediately if there is any pain, redness, warmth, fever, or chills.  No orders of the defined types were placed in this encounter.       Treatment Plan:  Injection today Ultracet prescription and followup as needed

## 2013-06-30 NOTE — Progress Notes (Signed)
06/30/2013 3:48 PM         NDC: 0454-0981-19    Lot Number: [id number]    Comments:

## 2013-09-01 MED ORDER — TRAMADOL HCL 50 MG PO TABS
50 MG | ORAL_TABLET | Freq: Four times a day (QID) | ORAL | Status: AC | PRN
Start: 2013-09-01 — End: 2013-09-11

## 2013-09-01 MED ORDER — TRAMADOL-ACETAMINOPHEN 37.5-325 MG PO TABS
ORAL_TABLET | Freq: Four times a day (QID) | ORAL | Status: DC | PRN
Start: 2013-09-01 — End: 2013-09-01

## 2013-09-01 NOTE — Addendum Note (Signed)
Addended by: Wende Mott on: 09/01/2013 04:09 PM     Modules accepted: Orders

## 2013-09-01 NOTE — Addendum Note (Signed)
Addended byDonnie Mesa on: 09/01/2013 04:25 PM     Modules accepted: Orders
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
## 2013-09-01 NOTE — Addendum Note (Signed)
Addended by: Wende Mott on: 09/01/2013 03:55 PM     Modules accepted: Orders

## 2013-09-01 NOTE — Progress Notes (Signed)
09/01/2013 4:25 PM         NDC: 6045-4098-11    Lot Number: [id number]    Comments:

## 2013-09-15 MED ORDER — FENTANYL 50 MCG/HR TD PT72
50 MCG/HR | MEDICATED_PATCH | TRANSDERMAL | Status: AC
Start: 2013-09-15 — End: 2013-10-15

## 2013-09-15 MED ORDER — HYDROCODONE-ACETAMINOPHEN 7.5-325 MG PO TABS
ORAL_TABLET | Freq: Four times a day (QID) | ORAL | Status: DC
Start: 2013-09-15 — End: 2013-12-15

## 2013-09-15 MED ORDER — HYDROCODONE-ACETAMINOPHEN 7.5-325 MG PO TABS
ORAL_TABLET | Freq: Four times a day (QID) | ORAL | Status: DC
Start: 2013-09-15 — End: 2014-03-09

## 2013-09-15 NOTE — Progress Notes (Signed)
Follow up: SPINE    CHIEF COMPLAINT:    Chief Complaint   Patient presents with   ??? Neck Pain   ??? Shoulder Pain       HISTORY OF PRESENT ILLNESS:                The patient is a 79 y.o. male here to follow up medication maintenance chronic left>right posterior shoulder and neck pain. He reports episodic aching left neck and posterior shoulder pain, that is increased with any prolonged activity/rom. Recent right shoulder injection w/Dr. Paulina Fusi whom performed previous shoulder surgeries.  Substantial relief Duragesic 1 q 48 and Norco 7.5/325 1 po QID. He also takes Lyrica 50 BID through his PCP. Currently denies any radiating arm pain, no numbness tingling or progressive weakness      Function-Does the pain medication improve your ability to do:   ?? Personal care: Yes  ?? Housework: Yes   ?? Physical activity: Yes  ?? Social activity: Yes    Pain Scale: 1-10  With Meds:   4/10  Pain Scale: 1-10 Without Meds: 6/10    Potential aberrant drug-related behavior:  ?? Aberrant behavior identified? NO  ?? Potential aberrant behavior identified? NO  ?? Reports loss are stolen prescriptions? NO  ?? Insist on certain medications by name? NO  ?? Purposeful oversedation? NO  ?? Increased dose without authorization? NO    Side Effects: Denies     Past Medical History:   Past Medical History   Diagnosis Date   ??? Diabetes mellitus (HCC)    ??? Cancer (HCC)      PROSTATE CANCER   ??? Pancreatitis (HCC) 10/15/11        REVIEW OF SYSTEMS:   CONSTITUTIONAL: Denies unexplained weight loss, fevers, chills or fatigue  NEUROLOGIC: Denies tremors or seizures         PHYSICAL EXAM:    Vitals: Blood pressure 129/65, pulse 90, height 5\' 10"  (1.778 m), weight 180 lb (81.647 kg).    GENERAL EXAM:  ?? General Apparence: Patient is adequately groomed with no evidence of malnutrition.  ?? Orientation: The patient is oriented to time, place and person.   ?? Mood & Affect:The patient's mood and affect are appropriate   ?? Gait & station: normal, patient ambulates  without assistance  ?? Vascular: Examination reveals no swelling tenderness in upper or lower extremities.    ?? Lymphatic: The lymphatic examination bilaterally reveals all areas to be without enlargement or induration  ?? Sensation: Sensation is intact without deficit  ?? Coordination/Balance: Good coordination     CERVICAL EXAMINATION:  ?? Inspection: Local inspection shows no step-off or bruising.  Cervical alignment is normal.     ?? Palpation: No evidence of tenderness at the midline, and trapezius.  Paraspinal tenderness is present. There is no step-off or paraspinal spasm.   ?? Range of Motion: Limited flexion, severely limited extension  ?? Strength: 5/5 bilateral upper extremities   ?? Special Tests:    ??   Spurling's, L'Hermitte's & Hoffman's negative bilaterally.   ??   Hawkins and Impingement tests are positive bilaterally.   ??  Cubital and Carpal tunnel Tinel's negative bilaterally.      ?? Skin:There are no rashes, ulcerations or lesions in right & left upper extremities.  ?? Reflexes: Bilaterally triceps, biceps and brachioradialis are 1+.  Clonus absent bilaterally at the feet.   ?? Gait & station: normal, patient ambulates without assistance     ?? Additional Examinations:       ??  RIGHT UPPER EXTREMETY:  Inspection/examination of the right upper extremity does not show any tenderness, deformity or injury. Range of motion is unremarkable. There is no gross instability.  There are no rashes, ulcerations or lesions. Strength and tone are normal.  ?? LEFT UPPER EXTREMETY: Inspection/examination of the left upper extremity does not show any tenderness, deformity or injury. Range of motion is unremarkable. There is no gross instability.  There are no rashes, ulcerations or lesions. Strength and tone are normal.    Diagnostic Testing:   Cervical MRI noted in chart from 2004 showing multilevel spondylosis     OARRs shows Tramadol script was filled, pt denies taking any Tramadol. He was informed he should not be taking  any other pain medication     Impression:   1) chronic left mechanical neck pain, multilevel spondylosis   2) chronic shoulder pain, s/p sx last w/Dr. Paulina Fusi    3) H/o prostate cancer   4) Opioid maintenance     Plan:   1) Refill Duragesic 50 mcg patch apply 1 q48hrs #15, 3 separate one month scripts given   2) refill Norco 7.5/325 i po QID #120 with 2 refills  3) The risks and use of maintenance opiate medication were reviewed. These include the risk of tolerance, addition, and abuse. Potential side effects were also discussed.   Medications are to be taken as prescribed and not escalated without prior agreement.  OARRS/KASPER reviewed.   Opiate medications will only be prescribed through the office of Dr Carmon Ginsberg. Donato Schultz, M. D. unless notified otherwise  4) Tamisha Nordstrom/u 88mo       Dictated by Marene Lenz, PA-C, also seen & evaluated by Dr. Donato Schultz

## 2013-12-15 MED ORDER — FENTANYL 50 MCG/HR TD PT72
50 MCG/HR | MEDICATED_PATCH | TRANSDERMAL | Status: AC
Start: 2013-12-15 — End: 2014-01-14

## 2013-12-15 MED ORDER — HYDROCODONE-ACETAMINOPHEN 7.5-325 MG PO TABS
ORAL_TABLET | Freq: Four times a day (QID) | ORAL | Status: DC
Start: 2013-12-15 — End: 2014-03-09

## 2013-12-15 MED ORDER — HYDROCODONE-ACETAMINOPHEN 7.5-325 MG PO TABS
ORAL_TABLET | Freq: Four times a day (QID) | ORAL | Status: DC
Start: 2013-12-15 — End: 2014-09-15

## 2013-12-15 NOTE — Progress Notes (Signed)
Follow up: SPINE    CHIEF COMPLAINT:    Chief Complaint   Patient presents with   ??? Neck Pain   ??? Shoulder Pain       HISTORY OF PRESENT ILLNESS:                The patient is a 79 y.o. male here to follow up medication maintenance chronic left>right posterior shoulder and neck pain. He reports episodic aching left neck and posterior shoulder pain, that is increased with any prolonged activity/rom. Previous  shoulder injections w/Dr. Paulina Fusi whom performed previous shoulder surgeries. Substantial relief Duragesic 1 q 48 and Norco 7.5/325 1 po QID. He also takes Lyrica 50 BID through his PCP. Currently denies any radiating arm pain, no numbness tingling or progressive weakness. Overall symptoms stable.       Function-Does the pain medication improve your ability to do:   ?? Personal care: Yes  ?? Housework: Yes  ?? Physical activity: Yes  ?? Social activity: Yes    Pain Scale: 1-10 With Meds: 3/10  Pain Scale: 1-10 Without Meds: 6/10    Potential aberrant drug-related behavior:  ?? Aberrant behavior identified? NO  ?? Potential aberrant behavior identified? NO  ?? Reports loss are stolen prescriptions? NO  ?? Insist on certain medications by name? NO  ?? Purposeful oversedation? NO  ?? Increased dose without authorization? NO    Side Effects: Denies     Past Medical History:   Past Medical History   Diagnosis Date   ??? Diabetes mellitus (HCC)    ??? Cancer (HCC)      PROSTATE CANCER   ??? Pancreatitis (HCC) 10/15/11        REVIEW OF SYSTEMS:   CONSTITUTIONAL: Denies unexplained weight loss, fevers, chills or fatigue  NEUROLOGIC: Denies tremors or seizures         PHYSICAL EXAM:    Vitals: Blood pressure 108/67, pulse 76, height 5\' 10"  (1.778 m), weight 180 lb (81.647 kg).    GENERAL EXAM:  ?? General Apparence: Patient is adequately groomed with no evidence of malnutrition.  ?? Orientation: The patient is oriented to time, place and person.   ?? Mood & Affect:The patient's mood and affect are appropriate   ?? Gait & station: normal,  patient ambulates without assistance  ?? Vascular: Examination reveals no swelling tenderness in upper or lower extremities.    ?? Lymphatic: The lymphatic examination bilaterally reveals all areas to be without enlargement or induration  ?? Sensation: Sensation is intact without deficit  ?? Coordination/Balance: Good coordination   CERVICAL EXAMINATION:  ?? Inspection: Local inspection shows no step-off or bruising. Cervical alignment is normal.  ?? Palpation: No evidence of tenderness at the midline, and trapezius. Paraspinal tenderness is present. There is no step-off or paraspinal spasm.  ?? Range of Motion: Limited flexion, severely limited extension  ?? Strength: 5/5 bilateral upper extremities  ?? Special Tests:  ?? Spurling's, L'Hermitte's & Hoffman's negative bilaterally.  ?? Hawkins and Impingement tests are positive bilaterally.  ?? Cubital and Carpal tunnel Tinel's negative bilaterally.  ?? Skin:There are no rashes, ulcerations or lesions in right & left upper extremities.  ?? Reflexes: Bilaterally triceps, biceps and brachioradialis are 1+. Clonus absent bilaterally at the feet.  ?? Gait & station: normal, patient ambulates without assistance  ?? Additional Examinations:   ?? RIGHT UPPER EXTREMETY: Inspection/examination of the right upper extremity does not show any tenderness, deformity or injury. Range of motion is unremarkable. There is no  gross instability. There are no rashes, ulcerations or lesions. Strength and tone are normal.  ?? LEFT UPPER EXTREMETY: Inspection/examination of the left upper extremity does not show any tenderness, deformity or injury. Range of motion is unremarkable. There is no gross instability. There are no rashes, ulcerations or lesions. Strength and tone are normal.    Diagnostic Testing:   Cervical MRI noted in chart from 2004 showing multilevel spondylosis      OARRs shows conflicting dates of when the Fentanyl was filled; however, pt is trustworthy      Impression:   1) chronic left  mechanical neck pain, multilevel spondylosis   2) chronic shoulder pain, s/p sx last w/Dr. Paulina Fusi    3) H/o prostate cancer   4) Opioid maintenance       Plan:   1) Refill Duragesic 50 mcg patch apply 1 q48hrs #15, 3 separate one month scripts given   2) refill Norco 7.5/325 i po QID #120 3 separate one month prescription  3) The risks and use of maintenance opiate medication were reviewed. These include the risk of tolerance, addition, and abuse. Potential side effects were also discussed.   Medications are to be taken as prescribed and not escalated without prior agreement.  OARRS/KASPER reviewed.   Opiate medications will only be prescribed through the office of Dr Carmon Ginsberg. Donato Schultz, M. D. unless notified otherwise  4) Hiyab Nhem/u 59mo       Dictated by Marene Lenz, PA-C, also seen & evaluated by Dr. Donato Schultz.

## 2014-03-09 MED ORDER — HYDROCODONE-ACETAMINOPHEN 7.5-325 MG PO TABS
ORAL_TABLET | Freq: Four times a day (QID) | ORAL | Status: DC | PRN
Start: 2014-03-09 — End: 2014-09-15

## 2014-03-09 MED ORDER — FENTANYL 50 MCG/HR TD PT72
50 MCG/HR | MEDICATED_PATCH | TRANSDERMAL | Status: DC
Start: 2014-03-09 — End: 2014-09-15

## 2014-03-09 NOTE — Progress Notes (Signed)
Follow up: SPINE    CHIEF COMPLAINT:    Chief Complaint   Patient presents with   ??? Neck Pain   ??? Shoulder Pain       HISTORY OF PRESENT ILLNESS:                The patient is a 80 y.o. male here to follow up medication maintenance chronic left>right posterior shoulder and neck pain. He reports episodic aching left neck and posterior shoulder pain, that is increased with any prolonged activity/rom. Previous shoulder injections w/Dr. Paulina FusiHess whom performed previous shoulder surgeries. Substantial relief Duragesic 50mcg 1 q 48 and Norco 7.5/325 1 po QID. He also takes Lyrica 50 BID through his PCP. Currently denies any radiating arm pain, no numbness tingling or progressive weakness. Overall symptoms stable.     Function-Does the pain medication improve your ability to do:   ?? Personal care: Yes  ?? Housework: Yes  ?? Physical activity: Yes  ?? Social activity: Yes    Pain Scale: 1-10 With Meds: 3-4/10  Pain Scale: 1-10 Without Meds: 6/10    Potential aberrant drug-related behavior:  ?? Aberrant behavior identified? NO  ?? Potential aberrant behavior identified? NO  ?? Reports loss are stolen prescriptions? NO  ?? Insist on certain medications by name? NO  ?? Purposeful oversedation? NO  ?? Increased dose without authorization? NO    Side Effects: Denies        Current/Past Treatment:   ?? Physical Therapy: past   ?? Injection:   Last CESI 2013  ?? Medications:  NSAIDs,  Duragesic, Norco   Surgery/Consult: Dr. Netta CorriganHee    Past Medical History:   Past Medical History   Diagnosis Date   ??? Diabetes mellitus (HCC)    ??? Cancer (HCC)      PROSTATE CANCER   ??? Pancreatitis (HCC) 10/15/11        REVIEW OF SYSTEMS:   CONSTITUTIONAL: Denies unexplained weight loss, fevers, chills or fatigue  NEUROLOGIC: Denies tremors or seizures         PHYSICAL EXAM:    Vitals: Blood pressure 131/61, pulse 105, height 5\' 10"  (1.778 m), weight 180 lb (81.647 kg).    GENERAL EXAM:  ?? General Apparence: Patient is adequately groomed with no evidence of  malnutrition.  ?? Orientation: The patient is oriented to time, place and person.   ?? Mood & Affect:The patient's mood and affect are appropriate   ?? Gait & station: normal, patient ambulates without assistance  ?? Vascular: Examination reveals no swelling tenderness in upper or lower extremities.    ?? Lymphatic: The lymphatic examination bilaterally reveals all areas to be without enlargement or induration  ?? Sensation: Sensation is intact without deficit  ?? Coordination/Balance: Good coordination   CERVICAL EXAMINATION:  ?? Inspection: Local inspection shows no step-off or bruising. Cervical alignment is normal.  ?? Palpation: No evidence of tenderness at the midline, and trapezius. Paraspinal tenderness is present. There is no step-off or paraspinal spasm.  ?? Range of Motion: Limited flexion, severely limited extension  ?? Strength: 5/5 bilateral upper extremities  ?? Special Tests:  ?? Spurling's, L'Hermitte's & Hoffman's negative bilaterally.  ?? Hawkins and Impingement tests are positive bilaterally.  ?? Cubital and Carpal tunnel Tinel's negative bilaterally.  ?? Skin:There are no rashes, ulcerations or lesions in right & left upper extremities.  ?? Reflexes: Bilaterally triceps, biceps and brachioradialis are 1+. Clonus absent bilaterally at the feet.  ?? Gait & station: normal, patient ambulates without  assistance  ?? Additional Examinations:  ?? RIGHT UPPER EXTREMETY: Inspection/examination of the right upper extremity does not show any tenderness, deformity or injury. Range of motion is unremarkable. There is no gross instability. There are no rashes, ulcerations or lesions. Strength and tone are normal.  ?? LEFT UPPER EXTREMETY: Inspection/examination of the left upper extremity does not show any tenderness, deformity or injury. Range of motion is unremarkable. There is no gross instability. There are no rashes, ulcerations or lesions. Strength and tone are normal.    Diagnostic Testing:   Cervical MRI noted in chart  from 2004 showing multilevel spondylosis         Impression:   1) chronic left mechanical neck pain, multilevel spondylosis   2) chronic shoulder pain, s/p sx last w/Dr. Paulina Fusi    3) H/o prostate cancer   4) Opioid maintenance       Plan:   1) Refill Duragesic 50 mcg patch apply 1 q48hrs #15, 3 separate one month scripts given   2) refill Norco 7.5/325 i po QID #120 3 separate one month prescription  3) The risks and use of maintenance opiate medication were reviewed. These include the risk of tolerance, addition, and abuse. Potential side effects were also discussed.   Medications are to be taken as prescribed and not escalated without prior agreement.  OARRS/KASPER reviewed.   Opiate medications will only be prescribed through the office of Dr Carmon Ginsberg. Donato Schultz, M. D. unless notified otherwise  4) Kaavya Puskarich/u 29mo       Dictated by Marene Lenz, PA-C, also seen & evaluated by Dr. Donato Schultz.
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
## 2014-05-04 NOTE — Progress Notes (Signed)
05/04/2014 11:41 AM         NDC: 1610-9604-540517-0720-01    Lot Number: [id number]    Comments:

## 2014-05-04 NOTE — Addendum Note (Signed)
Addended by: Janae BridgemanPARKINSON, Chareese Sergent on: 05/04/2014 11:41 AM     Modules accepted: Orders

## 2014-05-18 NOTE — Progress Notes (Signed)
Chief Complaint  Knee Pain    left  History of Present Illness:  Jordan Gutierrez is a 80 y.o. male    Medical History  Patient's medications, allergies, past medical, surgical, social and family histories were reviewed and updated as appropriate.    Review of Systems  Pertinent items are noted in HPI.  Relevant review of systems reviewed and available in the patient's chart    Vital Signs  There were no vitals filed for this visit.    General Exam:   Constitutional: Patient is adequately groomed with no evidence of malnutrition    Knee Examination  Inspection:  Recheck evaluation left knee injection only helped him a little     Palpation:  Palpation reveals lateral joint line pain no joint effusion today    Rang of Motion:  Full extension to 0 flexion to 140    Strength:  Quadricep, hamstring, EHL, hip flexor, internal and external rotation of the hip against resistance, all 5 over 5 equal bilaterall    Special Tests:  Negative Lachman negative anterior drawer negative pivot shift no posterior sag no posterior drawer does not open to valgus or varus stress at 0 or 30?? negative Steinmann's negative McMurray's negative Homans negative Moses pedal pulses are +2/4 capillary refill is brisk sensation is intact ankle exam and hip exam are unremarkable      Painful lateral joint space where there retropatellar crepitus    Skin: There are no rashes, ulcerations or lesions.    Gait: antalgic    Reflex +2/4 and equal bilaterally for patella and Achilles      Additional Comments:     Additional Examinations:  Right Lower Extremity: Examination of the right lower extremity does not show any tenderness, deformity or injury.  Range of motion is unremarkable.  There is no gross instability.  There are no rashes, ulcerations or lesions.  Strength and tone are normal.          Assessment :  Left knee lateral tibial plateau fracture old and osteoarthritis    Impression: same    Office Procedures:  No orders of the defined types were  placed in this encounter.       Treatment Plan:  Left knee Synvisc 1 upon next visit and I would order him a lateral unloader brace I think this will help him significantly

## 2014-05-19 ENCOUNTER — Encounter

## 2014-05-19 NOTE — Progress Notes (Signed)
Patient was prescribed a [phone] for left knee.  The patient has discomfort, weakness and instability of their left knee which requires stabilization from this semi-rigid / rigid orthosis to improve their function.  The orthosis will assist in alleviating joint surface pressure and discomfort in the affected area while providing functional support.  The patient was measured and educated regarding the device on 05/18/14 and will be fit with the device on or after 05/23/14 based on the insurance verification process.    Verbal and written instructions for the use and application of this item were provided.  The patient was educated and fit by a Designer, fashion/clothing with expert knowledge and specialization in brace application while under the direct supervision of the physician.  They were instructed to contact the office immediately should the brace result in increased pain, decreased sensation, increased swelling or worsening of the condition.Marland Kitchen

## 2014-06-01 MED ORDER — CELECOXIB 200 MG PO CAPS
200 MG | ORAL_CAPSULE | Freq: Every day | ORAL | Status: DC
Start: 2014-06-01 — End: 2015-01-03

## 2014-06-01 MED ORDER — HYDROCODONE-ACETAMINOPHEN 7.5-325 MG PO TABS
ORAL_TABLET | Freq: Four times a day (QID) | ORAL | Status: DC | PRN
Start: 2014-06-01 — End: 2014-09-15

## 2014-06-01 MED ORDER — FENTANYL 50 MCG/HR TD PT72
50 MCG/HR | MEDICATED_PATCH | TRANSDERMAL | Status: DC
Start: 2014-06-01 — End: 2014-09-15

## 2014-06-01 MED ORDER — HYDROCODONE-ACETAMINOPHEN 7.5-325 MG PO TABS
ORAL_TABLET | Freq: Four times a day (QID) | ORAL | Status: DC | PRN
Start: 2014-06-01 — End: 2015-03-14

## 2014-06-01 MED ORDER — FENTANYL 50 MCG/HR TD PT72
50 MCG/HR | MEDICATED_PATCH | TRANSDERMAL | Status: DC
Start: 2014-06-01 — End: 2015-03-14

## 2014-06-01 NOTE — Progress Notes (Addendum)
Follow up: SPINE    CHIEF COMPLAINT:    Chief Complaint   Patient presents with   ??? Back Pain       HISTORY OF PRESENT ILLNESS:                The patient is a 80 y.o. male here to follow up medication maintenance for chronic neck pain spondylosis, chronic bilateral shoulder pain and now mainly left knee pain.  He has a history of left knee arthritis and remote trauma/left tibial plateau fracture is having a lot of pain from this.  He is under the care of Dr. Paulina Fusi had recent steroid shot without improvements.  He is asking for something stronger.  He is on Duragesic 50 ??g q.48 hours and hydrocodone 7.5 q.i.d.    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 Physical Therapy:   ?? Chiropractic:     ?? Injection:   CESIs w/ relief  ?? Medications:     ?? Surgery/Consult:    Function-Does the pain medication improve your ability to do:   ?? Personal care: Yes  ?? Housework: Yes   ?? Physical activity: Yes  ?? Social activity: Yes he is able to walk short distances in the community  ?? to work: Yes     Pain Scale: 1-10  With Meds:   4  Pain Scale: 1-10 Without Meds:6    Potential aberrant drug-related behavior:  ?? Aberrant behavior identified? NO  ?? Potential aberrant behavior identified? NO  ?? Reports loss are stolen prescriptions? NO  ?? Insist on certain medications by name? NO  ?? Purposeful oversedation? NO  ?? Increased dose without authorization? NO    Side Effects:     Past Medical History:   Past Medical History   Diagnosis Date   ??? Diabetes mellitus (HCC)    ??? Cancer (HCC)      PROSTATE CANCER   ??? Pancreatitis (HCC) 10/15/11        REVIEW OF SYSTEMS:   CONSTITUTIONAL: Denies unexplained weight loss, fevers, chills or fatigue  NEUROLOGIC: Denies tremors or seizures         PHYSICAL EXAM:    Vitals: Blood pressure 145/79, pulse 77, height 5\' 10"  (1.778 m), weight 180 lb (81.647 kg).    GENERAL EXAM:  ?? General Apparence: Patient is adequately groomed with no  evidence of malnutrition.  ?? Orientation: The patient is oriented to time, place and person.   ?? Mood & Affect:The patient's mood and affect are appropriate   ?? Gait & station: normal, patient ambulates without assistance  ?? Vascular: Examination reveals no swelling tenderness in upper or lower extremities.    ?? Lymphatic: The lymphatic examination bilaterally reveals all areas to be without enlargement or induration  ?? Sensation: Sensation is intact without deficit  ?? Coordination/Balance: Good coordination     CERVICAL EXAMINATION:  ?? Inspection: Local inspection shows no step-off or bruising.  Cervical alignment is normal.     ?? Palpation: No evidence of tenderness at the midline, and trapezius.  Paraspinal tenderness is present. There is no step-off or paraspinal spasm.   ?? Range of Motion: mild loss of flexion extension, moderate loss of rotation  ?? Strength: 5/5 bilateral upper extremities   ?? Special Tests:    ??   Spurling's, L'Hermitte's & Hoffman's negative bilaterally.   ??   Hawkins and Impingement tests are negative bilaterally.   ??  Cubital and Carpal tunnel Tinel's negative bilaterally.      ?? Skin:There are no rashes, ulcerations or lesions in right & left upper extremities.  ?? Reflexes: Bilaterally triceps, biceps and brachioradialis are trace.  Clonus absent bilaterally at the feet.   ?? Gait & station: normal, patient ambulates without assistance     ?? Additional Examinations:       ?? RIGHT UPPER EXTREMITY:  Inspection/examination of the right upper extremity does not show any tenderness, deformity or injury. Range of motion is unremarkable. There is no gross instability.  There are no rashes, ulcerations or lesions. Strength and tone are normal.  ?? LEFT UPPER EXTREMITY: Inspection/examination of the left upper extremity does not show any tenderness, deformity or injury. Range of motion is unremarkable. There is no gross instability.  There are no rashes, ulcerations or lesions. Strength and tone are  normal.    ?? Additional Examinations: left knee is warm to touch without effusion, valgus deformities noted    Diagnostic Testing:   Cervical MRI noted in chart from 2004 showing multilevel spondylosis         Impression:   1) chronic left mechanical neck pain, multilevel spondylosis   2) chronic shoulder pain, s/p sx last w/Dr. Paulina FusiHess    3) H/o prostate cancer   4) Opioid maintenance   5) left knee oa    Plan:    1) Duragesic 50 ??g q.48 hours, 15 dispense each prescription with 3 separate one month prescriptions  2) hydrocodone 7.5 mg tablets 1 q.i.d. P.r.n. 120 with 2 refills  3) start celebrex 200 mg qd - Per PCP office no labs checked in the last year.  At next visit will order CMP.     The risks and use of maintenance opiate medication were reviewed.  These include the risk of tolerance, addition, and abuse.  Potential side effects were also discussed.   Medications are to be taken as prescribed and not escalated without prior agreement.  OARRS/KASPER reviewed & appropriate.   Opiate medications will only be prescribed through the office of Dr Carmon GinsbergF. Donato Schultzlifford Meggin Ola, M. D. unless notified otherwise         Carrieanne Kleen. Donato Schultzlifford Lillyn Wieczorek

## 2014-06-08 NOTE — Progress Notes (Signed)
06/08/2014 11:32 AM         NDC: 7373-6681-59    Lot Number: [id number]    Comments:

## 2014-06-08 NOTE — Progress Notes (Signed)
06/08/2014 11:05 AM         NDC: [address]-92    Lot Number: [id number]    Comments:

## 2014-06-08 NOTE — Progress Notes (Signed)
Chief Complaint  Knee Pain    left  History of Present Illness:  Jordan Gutierrez is a 80 y.o. male    Medical History  Patient's medications, allergies, past medical, surgical, social and family histories were reviewed and updated as appropriate.    Review of Systems  Pertinent items are noted in HPI.  Relevant review of systems reviewed and available in the patient's chart    Vital Signs  There were no vitals filed for this visit.    General Exam:   Constitutional: Patient is adequately groomed with no evidence of malnutrition    Knee Examination  Inspection:  Check evaluation left knee here today to get fitted for a brace and for Synvisc 1 injection    Palpation:  Pain to palpation along the lateral joint line in the medial joint line    Rang of Motion:  he has full extension to 0 he flexes to about 1:30    Strength:  Quadricep, hamstring, EHL, hip flexor, internal and external rotation of the hip against resistance, all 5 over 5 equal bilaterall    Special Tests:  Negative Lachman negative anterior drawer negative pivot shift no posterior sag no posterior drawer does not open to valgus or varus stress at 0 or 30?? negative Steinmann's negative McMurray's negative Homans negative Moses pedal pulses are +2/4 capillary refill is brisk sensation is intact ankle exam and hip exam are unremarkable      Moderate retropatellar crepitus some joint effusion    Skin: There are no rashes, ulcerations or lesions.    Gait: Antalgic    Reflex +2/4 and equal bilaterally for patella and Achilles      Additional Comments:     Additional Examinations:  Lower Back: Examination of the lower back does not show any tenderness, deformity or injury.  Range of motion is unremarkable.  There is no gross instability.  There are no rashes, ulcerations or lesions.  Strength and tone are normal.        Assessment :  Left knee osteoarthritis    Impression: left knee osteoarthritis    Office Procedures:The patient is symptomatic from left  osteoarthritis of the knee joint with documented radiological signs of arthritis. The patient has also failed 3 months of conservative treatment including home exercise, education, Tylenol and/or NSAIDs use. The patient was offered a Visco supplementation today. Risks, benefits, and alternatives to the injections were discussed in detail with the patient. The risks discussed included but are not limited to infection, skin reactions, hot swollen joints, and anaphylaxis. The patient gave verbal informed consent for the injection. The patient's skin was prepped with  sterile gauze  pads soaked with alcohol solution and with an 18 gauge needle the left  knee joint was injected with 6 ml of Synvisc ONE intra-articularly under sterile conditions.    The patient tolerated the injection reasonably well. The patient was given instructions to ice the left knee and avoid strenuous activities for 24-48 hours. The patient was instructed to call the office immediately if there is increased pain, redness, warmth, fever, or chills. We will see the patient back in one week for their second injection.   I added 1.5 mL of Celestone  Orders Placed This Encounter   Procedures   ??? PR SYNVISC OR SYNVISC-ONE     Left knee   ??? PR ARTHROCENTESIS ASPIR&/INJ MAJOR JT/BURSA W/O [place]     Left knee       Treatment Plan:  Injection follow-up  in 3 months

## 2014-06-08 NOTE — Addendum Note (Signed)
Addended by: Janae Bridgeman on: 06/08/2014 11:32 AM     Modules accepted: Orders

## 2014-09-15 MED ORDER — FENTANYL 50 MCG/HR TD PT72
50 MCG/HR | MEDICATED_PATCH | TRANSDERMAL | Status: AC
Start: 2014-09-15 — End: 2014-10-15

## 2014-09-15 MED ORDER — HYDROCODONE-ACETAMINOPHEN 7.5-325 MG PO TABS
ORAL_TABLET | Freq: Four times a day (QID) | ORAL | Status: DC
Start: 2014-09-15 — End: 2015-03-14

## 2014-09-15 MED ORDER — CELECOXIB 200 MG PO CAPS
200 MG | ORAL_CAPSULE | Freq: Every day | ORAL | Status: DC
Start: 2014-09-15 — End: 2015-01-03

## 2014-09-15 NOTE — Progress Notes (Signed)
Follow up: SPINE    CHIEF COMPLAINT:    Chief Complaint   Patient presents with   ??? Back Pain       HISTORY OF PRESENT ILLNESS:                The patient is a 80 y.o. male here to follow up medication maintenance for chronic left>right posterior shoulder and neck pain. He reports episodic aching left neck and posterior shoulder pain, that is increased with any prolonged activity/rom. Previous shoulder injections w/Dr. Paulina Fusi whom performed previous shoulder surgeries. Substantial relief Duragesic 1 q 48 and Norco 7.5/325 1 po QID. He also takes Lyrica 50 BID through his PCP. Recently started Celebrex for knee pain with significant improvement. Currently denies any radiating arm pain, no numbness tingling or progressive weakness. Overall symptoms stable.     Function-Does the pain medication improve your ability to do:   ?? Personal care: Yes  ?? Housework: Yes  ?? 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 Social activity: Yes, able to travel plans on traveling to [place]    Pain Scale: 1-10 With Meds: 3-4/10  Pain Scale: 1-10 Without Meds: 6-7/10    Potential aberrant drug-related behavior:  ?? Aberrant behavior identified? NO  ?? Potential aberrant behavior identified? NO  ?? Reports loss are stolen prescriptions? NO  ?? Insist on certain medications by name? NO  ?? Purposeful oversedation? NO  ?? Increased dose without authorization? NO    Side Effects: Denies      Current/Past Treatment:   ?? Physical Therapy: past  ?? Injection: Last CESI 2013  ?? Medications: NSAIDs,  Duragesic, Norco  Surgery/Consult: Dr. Netta Corrigan      Past Medical History:   Past Medical History   Diagnosis Date   ??? Diabetes mellitus (HCC)    ??? Cancer (HCC)      PROSTATE CANCER   ??? Pancreatitis 10/15/11        REVIEW OF SYSTEMS:   CONSTITUTIONAL: Denies unexplained weight loss, fevers, chills or fatigue  NEUROLOGIC: Denies tremors or seizures         PHYSICAL EXAM:    Vitals: Blood  pressure 142/83, pulse 75, height  (1.778 m), weight 175 lb (79.379 kg).    GENERAL EXAM:  ?? General Apparence: Patient is adequately groomed with no evidence of malnutrition.  ?? Orientation: The patient is oriented to time, place and person.   ?? Mood & Affect:The patient's mood and affect are appropriate   ?? Gait & station: normal, patient ambulates without assistance  ?? Vascular: Examination reveals no swelling tenderness in upper or lower extremities.    ?? Lymphatic: The lymphatic examination bilaterally reveals all areas to be without enlargement or induration  ?? Sensation: Sensation is intact without deficit  ?? Coordination/Balance: Good coordination  CERVICAL EXAMINATION:  ?? Inspection: Local inspection shows no step-off or bruising. Cervical alignment is normal.  ?? Palpation: No evidence of tenderness at the midline, and trapezius. Paraspinal tenderness is present. There is no step-off or paraspinal spasm.  ?? Range of Motion: Limited flexion, severely limited extension  ?? Strength: 5/5 bilateral upper extremities  ?? Special Tests:  ?? Spurling's, L'Hermitte's & Hoffman's negative bilaterally.  ?? Hawkins and Impingement tests are positive bilaterally.  ?? Cubital and Carpal tunnel Tinel's negative bilaterally.  ?? Skin:There are no rashes, ulcerations or lesions in right & left upper extremities.  ?? Reflexes: Bilaterally triceps,  biceps and brachioradialis are 1+. Clonus absent bilaterally at the feet.  ?? Gait & station: normal, patient ambulates without assistance  ?? Additional Examinations:  ?? RIGHT UPPER EXTREMETY: Inspection/examination of the right upper extremity does not show any tenderness, deformity or injury. Range of motion is unremarkable. There is no gross instability. There are no rashes, ulcerations or lesions. Strength and tone are normal.  ?? LEFT UPPER EXTREMETY: Inspection/examination of the left upper extremity does not show any tenderness, deformity or injury. Range of motion is  unremarkable. There is no gross instability. There are no rashes, ulcerations or lesions. Strength and tone are normal.    Diagnostic Testing:   Cervical MRI noted in chart from 2004 showing multilevel spondylosis         Impression:   1) chronic left mechanical neck pain, multilevel spondylosis   2) chronic shoulder pain, s/p sx last w/Dr. Paulina Fusi    3) H/o prostate cancer   4) Opioid maintenance       Plan:   1) Duragesic 50 mcg patch apply 1 q48hrs #15, 2 separate one month scripts given, he still has one patch to fill   2) Norco 7.5/325 i po QID #120 3 separate one month prescription  3) Refill Celebrex  i po qd PRN, CMP for NSAID use, he will call once this is completed for results, if okay we can give refills on Celebrex for 29mo total    4) The risks and use of maintenance opiate medication were reviewed. These include the risk of tolerance, addition, and abuse. Potential side effects were also discussed.   Medications are to be taken as prescribed and not escalated without prior agreement.  OARRS/KASPER reviewed.   Opiate medications will only be prescribed through the office of Dr Carmon Ginsberg. Donato Schultz, M. D. unless notified otherwise  5) F/u 29mo            Shanyah Gattuso Roxana Hires [facility]

## 2014-09-29 ENCOUNTER — Encounter: Attending: Physical Medicine & Rehabilitation | Primary: Internal Medicine

## 2014-12-13 ENCOUNTER — Ambulatory Visit
Admit: 2014-12-13 | Discharge: 2014-12-13 | Payer: MEDICARE | Attending: Physician Assistant | Primary: Internal Medicine

## 2014-12-13 DIAGNOSIS — M5030 Other cervical disc degeneration, unspecified cervical region: Secondary | ICD-10-CM

## 2014-12-13 MED ORDER — HYDROCODONE-ACETAMINOPHEN 7.5-325 MG PO TABS
ORAL_TABLET | ORAL | Status: DC
Start: 2014-12-13 — End: 2015-03-14

## 2014-12-13 MED ORDER — FENTANYL 50 MCG/HR TD PT72
50 MCG/HR | MEDICATED_PATCH | TRANSDERMAL | Status: DC
Start: 2014-12-13 — End: 2015-11-17

## 2014-12-13 MED ORDER — HYDROCODONE-ACETAMINOPHEN 7.5-325 MG PO TABS
ORAL_TABLET | ORAL | Status: DC
Start: 2014-12-13 — End: 2015-11-17

## 2014-12-13 MED ORDER — FENTANYL 50 MCG/HR TD PT72
50 MCG/HR | MEDICATED_PATCH | TRANSDERMAL | Status: DC
Start: 2014-12-13 — End: 2015-03-14

## 2014-12-13 NOTE — Progress Notes (Signed)
Follow up: SPINE    CHIEF COMPLAINT:    Chief Complaint   Patient presents with   ??? Follow-up     CK BACK        HISTORY OF PRESENT ILLNESS:                The patient is a 80 y.o. male here to follow up medication maintenance for chronic left>right neck & posterior shoulder pain, neck pain has been worse x44mo. He reports constant aching left neck and posterior shoulder pain, that is increased with any prolonged activity/ROM. Last CESI 2013 w/some improvements.  Previous shoulder injections w/Dr. Paulina Fusi whom performed previous shoulder surgeries. He typically reports substantial relief with Duragesic 1 q 48 and Norco 7.5/325 1 po QID. He also takes Lyrica 50 BID through his PCP. Currently denies any radiating arm pain, no numbness tingling or progressive weakness.     Function-Does the pain medication improve your ability to do:   ?? Personal care: Yes  ?? Housework: Yes  ?? 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 Social activity: Yes, able to travel plans on traveling to [place]    Pain Scale: 1-10 With Meds: 4-5/10  Pain Scale: 1-10 Without Meds: 7-8/10    Potential aberrant drug-related behavior:  ?? Aberrant behavior identified? NO  ?? Potential aberrant behavior identified? NO  ?? Reports loss are stolen prescriptions? NO  ?? Insist on certain medications by name? NO  ?? Purposeful oversedation? NO  ?? Increased dose without authorization? NO    Side Effects: Denies     Current/Past Treatment:   ?? Physical Therapy: past  ?? Injection: Last CESI 2013  ?? Medications: NSAIDs, Duragesic, Norco  Surgery/Consult: Dr. Netta Corrigan       Past Medical History: Medical history form was reviewd today & scanned into the Media tab  Past Medical History   Diagnosis Date   ??? Diabetes mellitus (HCC)    ??? Cancer (HCC)      PROSTATE CANCER   ??? Pancreatitis 10/15/11        REVIEW OF SYSTEMS:   CONSTITUTIONAL: Denies unexplained weight loss, fevers, chills or fatigue  NEUROLOGIC:  Denies tremors or seizures         PHYSICAL EXAM:    Vitals: Blood pressure 158/80, pulse 81, height 5' 10.08" (1.78 m), weight 175 lb 0.7 oz (79.4 kg).    GENERAL EXAM:  ?? General Apparence: Patient is adequately groomed with no evidence of malnutrition.  ?? Orientation: The patient is oriented to time, place and person.   ?? Mood & Affect:The patient's mood and affect are appropriate   ?? Gait & station: normal, patient ambulates without assistance  ?? Vascular: Examination reveals no swelling tenderness in upper or lower extremities.    ?? Lymphatic: The lymphatic examination bilaterally reveals all areas to be without enlargement or induration  ?? Sensation: Sensation is intact without deficit  ?? Coordination/Balance: Good coordination   CERVICAL EXAMINATION:  ?? Inspection: Local inspection shows no step-off or bruising. Cervical alignment is normal.  ?? Palpation: No evidence of tenderness at the midline, and trapezius. Paraspinal tenderness is present. There is no step-off or paraspinal spasm.  ?? Range of Motion: Limited flexion, severely limited extension  ?? Strength: 5/5 bilateral upper extremities  ?? Special Tests:  ?? Spurling's, L'Hermitte's & Hoffman's negative bilaterally.  ?? Hawkins and Impingement tests are positive bilaterally.  ?? Cubital and Carpal tunnel Tinel's  negative bilaterally.  ?? Skin:There are no rashes, ulcerations or lesions in right & left upper extremities.  ?? Reflexes: Bilaterally triceps, biceps and brachioradialis are 1+. Clonus absent bilaterally at the feet.  ?? Gait & station: normal, patient ambulates without assistance  ?? Additional Examinations:  ?? RIGHT UPPER EXTREMETY: Inspection/examination of the right upper extremity does not show any tenderness, deformity or injury. Range of motion is unremarkable. There is no gross instability. There are no rashes, ulcerations or lesions. Strength and tone are normal.  ?? LEFT UPPER EXTREMETY: Inspection/examination of the left upper extremity  does not show any tenderness, deformity or injury. Range of motion is unremarkable. There is no gross instability. There are no rashes, ulcerations or lesions. Strength and tone are normal.    Diagnostic Testing:   Cervical MRI noted in chart from 2004 showing multilevel spondylosis         Impression:   1) Acute/chronic left mechanical neck pain, multilevel spondylosis   2) chronic shoulder pain, s/p sx last w/Dr. Paulina FusiHess    3) H/o prostate cancer   4) Opioid maintenance     Plan:   1) Duragesic 50 mcg patch apply 1 q48hrs #15, 2 separate one month scripts given, he still has one patch to fill   2) Norco 7.5/325 i po QID #120 3 separate one month prescription  3) Updated cervical MRI then left C7-T1 CESI #1 new series with IV sedation   4) F/u 2mo for medication maintenance, sooner if no improvement with acute pain     4) The risks and use of maintenance opiate medication were reviewed. These include the risk of tolerance, addition, and abuse. Potential side effects were also discussed.   Medications are to be taken as prescribed and not escalated without prior agreement.  OARRS/KASPER reviewed.   Opiate medications will only be prescribed through the office of Dr Carmon GinsbergF. Donato Schultzlifford Valentin, M. D. unless notified otherwise      Wonda CeriseMegan Marie Needham Biggins [facility]

## 2014-12-15 ENCOUNTER — Encounter: Primary: Internal Medicine

## 2014-12-19 ENCOUNTER — Inpatient Hospital Stay: Admit: 2014-12-19 | Attending: Physician Assistant | Primary: Internal Medicine

## 2014-12-19 DIAGNOSIS — M5030 Other cervical disc degeneration, unspecified cervical region: Secondary | ICD-10-CM

## 2014-12-29 NOTE — Patient Instructions (Signed)
PATIENT INSTRUCTED TO FOLLOW DR VALENTIN'S INSTRUCTIONS

## 2015-01-02 NOTE — H&P (Signed)
HISTORY AND PHYSICAL/PRE-SEDATION ASSESSMENT    Patient:  Jordan Gutierrez, Jordan Gutierrez   DOB:  February 19, 1929  Medical Record No.:  [id number]   Date:   01-03-15  Physician:  Pierre Bali, M.D.  Facility: [facility]     Nursing History and Physical reviewed and agreed upon.      Additional findings:    Allergies:  Review of patient's allergies indicates no known allergies.    Home Medications:    Prior to Admission medications    Medication Sig Start Date End Date Taking? Authorizing Provider   fentaNYL (DURAGESIC) 50 MCG/HR 1 Q 48 HRS       TO FILL ON OR AFTER 01/27/2015 12/13/14   Tawni Carnes, PA-C   fentaNYL (DURAGESIC) 50 MCG/HR 1 Q 48 HRS       TO FILL ON OR AFTER 02/25/2015 12/13/14   Tawni Carnes, PA-C   HYDROcodone-acetaminophen (NORCO) 7.5-325 MG per tablet 1 PO QID     FILL ON OR AFTER 12/19/14 12/13/14   Tawni Carnes, PA-C   HYDROcodone-acetaminophen (NORCO) 7.5-325 MG per tablet 1 PO QID       FILL ON OR AFTER 01/17/2015 12/13/14   Tawni Carnes, PA-C   HYDROcodone-acetaminophen (NORCO) 7.5-325 MG per tablet 1 PO QID       FILL ON OR AFTER 02/15/2015 12/13/14   Tawni Carnes, PA-C   HYDROcodone-acetaminophen [facility]) 7.5-325 MG per tablet Take 1 tablet by mouth 4 times daily May fill 09/15/14 09/15/14   Tawni Carnes, PA-C   HYDROcodone-acetaminophen [facility]) 7.5-325 MG per tablet Take 1 tablet by mouth 4 times daily May fill 10/13/14 09/15/14   Tawni Carnes, PA-C   HYDROcodone-acetaminophen [facility]) 7.5-325 MG per tablet Take 1 tablet by mouth 4 times daily May fill 11/11/14 09/15/14   Tawni Carnes, PA-C   celecoxib (CELEBREX) 200 MG capsule Take 1 capsule by mouth daily 09/15/14   Tawni Carnes, PA-C   fentaNYL (DURAGESIC) 50 MCG/HR 1 q 48 hours    May fill 08/24/14 06/01/14   Pierre Bali, MD   HYDROcodone-acetaminophen [facility]) 7.5-325 MG per tablet Take 1 tablet by mouth every 6 hours as needed for Pain May fill 08/15/14 06/01/14   Iniko Robles Donato Schultz, MD    celecoxib (CELEBREX) 200 MG capsule Take 1 capsule by mouth daily 06/01/14   Afreen Siebels Donato Schultz, MD   pregabalin (LYRICA) 50 MG capsule Take 50 mg by mouth 2 times daily.    Historical Provider, MD   gabapentin (NEURONTIN) 300 MG capsule 1 -2 po q 6 hrs prn    90 day supply 09/09/12   Pierre Bali, MD   finasteride (PROSCAR) 5 MG tablet Take 5 mg by mouth daily.    Historical Provider, MD   METFORMIN HCL PO Take  by mouth. TAKE 2 TABLETS TWICE A DAY     Historical Provider, MD   escitalopram (LEXAPRO) 10 MG tablet Take 10 mg by mouth daily.      Historical Provider, MD   ZOLPIDEM TARTRATE PO Take  by mouth nightly.      Historical Provider, MD       Vitals:  BP 140/72 mmHg   Pulse 69   Temp(Src) 97.5 ??Laiken Sandy (36.4 ??C) (Temporal)   Resp 18   Ht 5\' 8"  (1.727 m)   Wt 180 lb (81.647 kg)   BMI 27.38 kg/m2   SpO2 98%        PHYSICAL EXAM:  HENT: Airway patent and reviewed  Cardiovascular: Normal rate, regular rhythm, normal heart sounds.   Pulmonary/Chest: No wheezes. No rhonchi. No rales.   Abdominal: Soft. Bowel sounds are normal. No distension.    ASA CLASS:            I. Normal, healthy adult              II.  Mild systemic disease               III.  Severe systemic disease      Sedation plan:     Local                Minimal                    General anesthesia    Patient's condition acceptable for planned procedure/sedation.   Post Procedure Plan   Return to same level of care   ______________________     The risks and benefits as well as alternatives to the procedure have been discussed with the patient and or family.  The patient and or next of kin understands and agrees to proceed.    Pierre Bali, M.D.

## 2015-01-03 ENCOUNTER — Inpatient Hospital Stay: Admit: 2015-01-03 | Attending: Physical Medicine & Rehabilitation | Primary: Internal Medicine

## 2015-01-03 LAB — POCT GLUCOSE: POC Glucose: 114 mg/dl — ABNORMAL HIGH (ref 70–99)

## 2015-01-03 MED ORDER — IOHEXOL 240 MG/ML IJ SOLN
240 MG/ML | INTRAMUSCULAR | Status: AC
Start: 2015-01-03 — End: ?

## 2015-01-03 MED ORDER — LACTATED RINGERS IV SOLN
INTRAVENOUS | Status: DC
Start: 2015-01-03 — End: 2015-01-04
  Administered 2015-01-03: 18:00:00 via INTRAVENOUS

## 2015-01-03 MED ORDER — MIDAZOLAM HCL 2 MG/2ML IJ SOLN
2 MG/ML | INTRAMUSCULAR | Status: DC
Start: 2015-01-03 — End: 2015-01-04

## 2015-01-03 MED ORDER — FENTANYL CITRATE 0.05 MG/ML IJ SOLN
0.05 MG/ML | INTRAMUSCULAR | Status: DC
Start: 2015-01-03 — End: 2015-01-03

## 2015-01-03 MED ORDER — LIDOCAINE HCL 1 % (PF) IJ SOLN 2 ML
1 % (PF) | Freq: Once | INTRAMUSCULAR | Status: AC
Start: 2015-01-03 — End: 2015-01-03
  Administered 2015-01-03: 18:00:00 0.1 mL via INTRADERMAL

## 2015-01-03 MED ORDER — METHYLPREDNISOLONE ACETATE 40 MG/ML IJ SUSP
40 MG/ML | INTRAMUSCULAR | Status: AC
Start: 2015-01-03 — End: ?

## 2015-01-03 MED ORDER — DEXAMETHASONE SOD PHOSPHATE PF 10 MG/ML IJ SOLN
10 MG/ML | INTRAMUSCULAR | Status: AC
Start: 2015-01-03 — End: ?

## 2015-01-03 MED FILL — DEXAMETHASONE SOD PHOSPHATE PF 10 MG/ML IJ SOLN: 10 MG/ML | INTRAMUSCULAR | Qty: 1

## 2015-01-03 MED FILL — FENTANYL CITRATE 0.05 MG/ML IJ SOLN: 0.05 MG/ML | INTRAMUSCULAR | Qty: 2

## 2015-01-03 MED FILL — LIDOCAINE HCL (PF) 1 % IJ SOLN: 1 % | INTRAMUSCULAR | Qty: 2

## 2015-01-03 MED FILL — DEPO-MEDROL 40 MG/ML IJ SUSP: 40 MG/ML | INTRAMUSCULAR | Qty: 5

## 2015-01-03 MED FILL — MIDAZOLAM HCL 2 MG/2ML IJ SOLN: 2 MG/ML | INTRAMUSCULAR | Qty: 2

## 2015-01-03 MED FILL — LACTATED RINGERS IV SOLN: INTRAVENOUS | Qty: 1000

## 2015-01-03 MED FILL — OMNIPAQUE 240 MG/ML IJ SOLN: 240 MG/ML | INTRAMUSCULAR | Qty: 50

## 2015-01-03 NOTE — Progress Notes (Signed)
Jordan Gutierrez and Jordan Gutierrez in procedure room.

## 2015-01-03 NOTE — Progress Notes (Addendum)
Omnipaque 240mg /ml-5 ml  Dexamethasone 10mg /ml-341ml  0.9%Sodium Chloride 2ml

## 2015-01-03 NOTE — Op Note (Signed)
Patient:  Jordan Gutierrez, [name]   Medical Record #:  [id number]   Date:  01-03-15  Physician:  Pierre BaliF Clifford Christabelle Hanzlik, M.D.  [facility]     Pre-op diagnosis:  Cervical radiculitis, cervical spondylosis  Post-op diagnosis:  same  Procedure: Left C7/T1 cervical interlaminar epidural injection #1 with flouroscopic guidance  Anesthesia: Conscious sedation with 2mg  Versed  Procedure Note:    The patient was admitted through pre-op and written consent was obtained.  The patient was advised of the risks and benefits of the procedure, including but not limited to the following: bleeding, pain, infection, temporary paralysis, nerve damage and spinal headache.  The patient was given the opportunity to ask questions.  There were no contraindications for this procedure.    The appropriate area was prepped and draped in a sterile fashion.  Landmarks were identified and marked.  The skin and soft tissues were anesthetized with 1% lidocaine.  A 22G 3.5inch Touhy needle was advanced to the C left 7/T1 interlaminar space using fluoroscopic guidance confirmed by multiple views showing appropriate needle placement.  Injection of contrast showed epidural flow.  There were no signs of intravascular or intrathecal injection.    10 mg dexamethasone and 2 mL normal saline solution were then injected.     There were no complications and the patient tolerated the procedure well.  The patient was transferred to the recovery area and monitored.  Discharge instructions were given.  The patient is to contact me for any post-procedure concerns.  The patient is to follow up as scheduled.    LOR: 3cm    Ardra Kuznicki Donato Schultzlifford Sharada Albornoz, MD

## 2015-01-03 NOTE — Progress Notes (Signed)
Site of injection clean dry and intact. Instructions reviewed with pt family. Verbalized understanding. To be discharged to home with family per wheelchair.

## 2015-01-03 NOTE — Progress Notes (Signed)
NURSING CARE PLANS FOLLOWED     ?? Potential for anxiety-decrease anxiety-allow patient to verbalize  ?? Potential for infection-no infection-proper infection controls  ?? Potential for fall the patient will move to fall risk after procedure- through the recovery  phase   ?? Orient to environment  ?? Call light in reach  ?? Instruct to call for assistance prior to getting up  ?? Non skid footwear on   ?? Bed wheels locked and bed in lowest position - siderails up times two  ?? Potential for deep sedation-no deep sedation-know maximum allowable dose         adverse reactions and nursing considerations. Assess VS and LOC

## 2015-01-03 NOTE — Op Note (Signed)
POST SEDATION ASSESSMENT      Patient:  Jordan Gutierrez, Jordan Gutierrez   DOB:  24-Apr-1929  Medical Record No.:  [id number]   Date:  01-03-15  Physician:  Pierre BaliF Clifford Jamiyah Dingley, M.D.      Patient location: Recovery  Level of consciousness: Awake, Alert, Oriented  Pain Control: Good  Respiration: Adequate  Post-op assessment: No sedation complications    Last Vitals:   Filed Vitals:    01/03/15 1340   BP: 140/82   Pulse: 70   Temp:    Resp: 16   SpO2: 94%     Post-op Vitals: Stable    Earnestine Shipp Clifford Branda Chaudhary  10:14 AM

## 2015-03-14 ENCOUNTER — Ambulatory Visit
Admit: 2015-03-14 | Discharge: 2015-03-14 | Payer: MEDICARE | Attending: Physician Assistant | Primary: Internal Medicine

## 2015-03-14 ENCOUNTER — Encounter: Payer: MEDICARE | Attending: Physician Assistant | Primary: Internal Medicine

## 2015-03-14 DIAGNOSIS — M5030 Other cervical disc degeneration, unspecified cervical region: Secondary | ICD-10-CM

## 2015-03-14 MED ORDER — HYDROCODONE-ACETAMINOPHEN 7.5-325 MG PO TABS
ORAL_TABLET | Freq: Four times a day (QID) | ORAL | Status: DC
Start: 2015-03-14 — End: 2015-11-17

## 2015-03-14 MED ORDER — FENTANYL 50 MCG/HR TD PT72
50 MCG/HR | MEDICATED_PATCH | TRANSDERMAL | Status: AC
Start: 2015-03-14 — End: 2015-04-13

## 2015-03-14 NOTE — Addendum Note (Signed)
Addended by: Wonda CeriseSTANFIELD, [facility] Reidinger MARIE on: 03/14/2015 11:36 AM     Modules accepted: Level of Service

## 2015-03-14 NOTE — Progress Notes (Signed)
Follow up: SPINE    CHIEF COMPLAINT:    Chief Complaint   Patient presents with   ??? Back Pain       HISTORY OF PRESENT ILLNESS:                The patient is a 81 y.o. male here to follow up medication maintenance and after recent left C7-T1 CESI #1 from 01/03/2015 for a history of acute/chronic neck pain, worse x3-7861mo. He reported constant aching left neck and posterior shoulder pain, that is increased with any prolonged activity/ROM.  States he is greater than 50% improved since recent CESI.  Previous shoulder injections w/Dr. Paulina FusiHess whom performed previous shoulder surgeries.  Still reports good improvement with Duragesic 50mcg 1 q 48 and Norco 7.5/325 1 po QID. He also takes Lyrica 50 BID through his PCP. Currently denies any radiating arm pain, no numbness tingling or progressive weakness. Neck pain back to baseline at this time.     Function-Does the pain medication improve your ability to do:   ?? Personal care: Yes  ?? Housework: Yes  ?? 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 Social activity: Yes, able to travel [place]    Pain Scale: 1-10 With Meds: 01-3/10  Pain Scale: 1-10 Without Meds: 6-8/10    Potential aberrant drug-related behavior:  ?? Aberrant behavior identified? NO  ?? Potential aberrant behavior identified? NO  ?? Reports loss are stolen prescriptions? NO  ?? Insist on certain medications by name? NO  ?? Purposeful oversedation? NO  ?? Increased dose without authorization? NO    Side Effects: Denies     Current/Past Treatment:   ?? Physical Therapy: past  ?? Injection: LC7-T1 CESI 12-2014 w/improvements   ?? Medications: NSAIDs, Duragesic 50mcg q48 & Norco 7.5 QID PRN  Surgery/Consult: Dr. Paulina FusiHess    Past Medical History: Medical history form was reviewd today & scanned into the Media tab  Past Medical History   Diagnosis Date   ??? Diabetes mellitus (HCC)    ??? Cancer (HCC)      PROSTATE CANCER   ??? Pancreatitis 10/15/11         REVIEW OF SYSTEMS:   CONSTITUTIONAL: Denies unexplained weight loss, fevers, chills or fatigue  NEUROLOGIC: Denies tremors or seizures         PHYSICAL EXAM:    Vitals: Blood pressure 134/75, pulse 91, height 5\' 10"  (1.778 m), weight 175 lb (79.379 kg).    GENERAL EXAM:  ?? General Apparence: Patient is adequately groomed with no evidence of malnutrition.  ?? Orientation: The patient is oriented to time, place and person.   ?? Mood & Affect:The patient's mood and affect are appropriate   ?? Gait & station: normal, patient ambulates without assistance  ?? Vascular: Examination reveals no swelling tenderness in upper or lower extremities.    ?? Lymphatic: The lymphatic examination bilaterally reveals all areas to be without enlargement or induration  ?? Sensation: Sensation is intact without deficit  ?? Coordination/Balance: Good coordination     CERVICAL EXAMINATION:  ?? Inspection: Local inspection shows no step-off or bruising.  Cervical alignment is normal.     ?? Palpation: No evidence of tenderness at the midline, and trapezius.  Paraspinal tenderness is present. There is no step-off or paraspinal spasm.   ?? Range of Motion: Mildly limited flexion moderate severely limited extension and lateral rotations  ?? Strength: 5/5 bilateral upper extremities   ??  Special Tests:    ??   Spurling's, L'Hermitte's & Hoffman's negative bilaterally.   ??  Cubital and Carpal tunnel Tinel's negative bilaterally.      ?? Skin:There are no rashes, ulcerations or lesions in right & left upper extremities.  ?? Reflexes: Bilaterally triceps, biceps and brachioradialis are 1+  Clonus absent bilaterally at the feet.   ?? Gait & station: normal, patient ambulates without assistance     ?? Additional Examinations:       ?? RIGHT UPPER EXTREMITY:  Inspection/examination of the right upper extremity does not show any tenderness, deformity or injury. Range of motion is full. There is no gross instability.  There are no rashes, ulcerations or lesions.  Strength and tone are normal.  ?? LEFT UPPER EXTREMITY: Inspection/examination of the left upper extremity does not show any tenderness, deformity or injury. Range of motion is full. There is no gross instability.  There are no rashes, ulcerations or lesions. Strength and tone are normal.      Diagnostic Testing:   Updated cervical MRI scan report reviewed 12/19/2014 showing multilevel DDD/spondylosis most notably C6 7 moderate central stenosis C3 4, C4 5        Impression:   1) Acute/chronic left mechanical neck pain, multilevel spondylosis, improved   2) chronic shoulder pain, s/p sx last w/Dr. Paulina Fusi    3) H/o prostate cancer   4) Opioid maintenance     Plan:   1) Duragesic 50 mcg patch apply 1 q48hrs #15, 3 separate one month scripts   2) Norco 7.5/325 i po QID #120 3 separate one month prescription  3) May call for left C7-T1 CESI #2 with IV sedation   4) F/u 55mo for medication maintenance with [facility]      The risks and use of maintenance opiate medication were reviewed. These include the risk of tolerance, addition, and abuse. Potential side effects were also discussed.   Medications are to be taken as prescribed and not escalated without prior agreement.  OARRS/KASPER reviewed.   Opiate medications will only be prescribed through the office of Dr Carmon Ginsberg. Donato Schultz, M. D. unless notified otherwise      Wonda Cerise [facility]

## 2015-03-14 NOTE — Patient Instructions (Signed)
Follow up with Dr. Valentin in 3 months

## 2015-06-15 ENCOUNTER — Ambulatory Visit
Admit: 2015-06-15 | Discharge: 2015-06-15 | Payer: MEDICARE | Attending: Physical Medicine & Rehabilitation | Primary: Internal Medicine

## 2015-06-15 DIAGNOSIS — M5030 Other cervical disc degeneration, unspecified cervical region: Secondary | ICD-10-CM

## 2015-06-15 MED ORDER — FENTANYL 50 MCG/HR TD PT72
50 MCG/HR | MEDICATED_PATCH | TRANSDERMAL | Status: DC
Start: 2015-06-15 — End: 2015-11-17

## 2015-06-15 MED ORDER — HYDROCODONE-ACETAMINOPHEN 7.5-325 MG PO TABS
ORAL_TABLET | ORAL | Status: DC
Start: 2015-06-15 — End: 2015-11-17

## 2015-06-15 NOTE — Progress Notes (Signed)
Follow up: SPINE    CHIEF COMPLAINT:    Chief Complaint   Patient presents with   ??? Back Pain     fu back, about the same, no change        HISTORY OF PRESENT ILLNESS:                The patient is a 81 y.o. male here to follow up medication maintenance .  He is cervical spondylosis and bilateral mechanical shoulder pain.  He reports pain has been better controlled since a cervical epidural in January.  He still has improvements in range of motion of his neck.  He is active taking care of his garden.  He has no progressive weakness.  He had been taking care of his wife was recently admitted to a nursing home for a stroke      Current/Past Treatment:   ?? Physical Therapy:   ?? Chiropractic:     ?? Injection: last CESI January 2016    ?? Medications:  Duragesic 50 ??g q.48 hours of the Duragesic patch; hydrocodone 7.5 mg q.i.d. P.r.n. pain   ?? Surgery/Consult:    Function-Does the pain medication improve your ability to do:   ?? Personal care: Yes  ?? Housework: Yes   ?? Physical activity: Yes  ?? Social activity: Lorella Nimrod is gardening and helps take care of his wife  ?? to work: na    Pain Scale: 1-10  With Meds:   6  Pain Scale: 1-10 Without Meds:10    Potential aberrant drug-related behavior:  ?? Aberrant behavior identified? NO  ?? Potential aberrant behavior identified? NO  ?? Reports loss are stolen prescriptions? NO  ?? Insist on certain medications by name? NO  ?? Purposeful oversedation? NO  ?? Increased dose without authorization? NO    Side Effects:     Past Medical History: Medical history form was reviewd today & scanned into the Media tab  Past Medical History   Diagnosis Date   ??? Diabetes mellitus (HCC)    ??? Cancer (HCC)      PROSTATE CANCER   ??? Pancreatitis 10/15/11        REVIEW OF SYSTEMS:   CONSTITUTIONAL: Denies unexplained weight loss, fevers, chills or fatigue  NEUROLOGIC: Denies tremors or seizures         PHYSICAL EXAM:    Vitals: Blood pressure 129/61, pulse 82, height 5' 10.08" (1.78 m), weight 175 lb 0.7 oz  (79.4 kg).    GENERAL EXAM:  ?? General Apparence: Patient is adequately groomed with no evidence of malnutrition.  ?? Orientation: The patient is oriented to time, place and person.   ?? Mood & Affect:The patient's mood and affect are appropriate   ?? Gait & station: normal, patient ambulates without assistance  ?? Vascular: Examination reveals no swelling tenderness in upper or lower extremities.    ?? Lymphatic: The lymphatic examination bilaterally reveals all areas to be without enlargement or induration  ?? Sensation: Sensation is intact without deficit  ?? Coordination/Balance: Good coordination     CERVICAL EXAMINATION:  ?? Inspection: Local inspection shows no step-off or bruising.  Cervical alignment is normal.     ?? Palpation: No evidence of tenderness at the midline, and trapezius.  Paraspinal tenderness is present. There is no step-off or paraspinal spasm.   ?? Range of Motion: moderate loss of flexion extension and rotation  ?? Strength: 5/5 bilateral upper extremities   ?? Special Tests:    ??  Spurling's, L'Hermitte's & Hoffman's negative bilaterally.   ??   Hawkins and Impingement tests are negative bilaterally.   ??  Cubital and Carpal tunnel Tinel's negative bilaterally.      ?? Skin:There are no rashes, ulcerations or lesions in right & left upper extremities.  ?? Reflexes: Bilaterally triceps, biceps and brachioradialis are 2+.  Clonus absent bilaterally at the feet.   ?? Gait & station: normal, patient ambulates without assistance     ?? Additional Examinations:       ?? RIGHT UPPER EXTREMITY:  Inspection/examination of the right upper extremity does not show any tenderness, deformity or injury. Range of motion ismoderate diminished hip flexion and abduction shoulderThere is no gross instability.  There are no rashes, ulcerations or lesions. Strength and tone are normal.  ?? LEFT UPPER EXTREMITY: Inspection/examination of the left upper extremity does not show any tenderness, deformity or injury. Range of motion  is moderate reduction of flexion and abduction There is no gross instability.  There are no rashes, ulcerations or lesions. Strength and tone are normal.      Diagnostic Testing:     cervical MRI scan report reviewed 12/19/2014 showing multilevel DDD/spondylosis most notably C6 7 moderate central stenosis C3 4, C4 5    UDS June 15, 2015: Positive for OXY as expected    Impression:  1) left mechanical neck pain, multilevel spondylosis, improved   2) chronic shoulder pain, s/p sx last w/Dr. Paulina Fusi    3) H/o prostate cancer   4) Opioid maintenance       Plan:      UDS     Hydrocodone 7.5 mg tablets q.i.d. P.r.n. Pain #120 with 3 separate prescriptions  Duragesic 50 ??g q.48 hours 15 dispensed in each prescription, 3 separate one month prescription    The risks and use of maintenance opiate medication were reviewed.  These include the risk of tolerance, addition, and abuse.  Potential side effects were also discussed.   Medications are to be taken as prescribed and not escalated without prior agreement.  OARRS/KASPER reviewed & appropriate.   Opiate medications will only be prescribed through the office of Dr Carmon Ginsberg. Donato Schultz, M. D. unless notified otherwise         Kentaro Alewine Donato Schultz

## 2015-09-14 ENCOUNTER — Ambulatory Visit
Admit: 2015-09-14 | Discharge: 2015-09-14 | Payer: MEDICARE | Attending: Physical Medicine & Rehabilitation | Primary: Internal Medicine

## 2015-09-14 DIAGNOSIS — M5030 Other cervical disc degeneration, unspecified cervical region: Secondary | ICD-10-CM

## 2015-09-14 MED ORDER — HYDROCODONE-ACETAMINOPHEN 7.5-325 MG PO TABS
ORAL_TABLET | Freq: Four times a day (QID) | ORAL | 0 refills | Status: DC
Start: 2015-09-14 — End: 2016-08-16

## 2015-09-14 MED ORDER — HYDROCODONE-ACETAMINOPHEN 7.5-325 MG PO TABS
ORAL_TABLET | Freq: Four times a day (QID) | ORAL | 0 refills | Status: DC
Start: 2015-09-14 — End: 2015-12-04

## 2015-09-14 MED ORDER — FENTANYL 50 MCG/HR TD PT72
50 MCG/HR | MEDICATED_PATCH | TRANSDERMAL | 0 refills | Status: AC
Start: 2015-09-14 — End: 2015-10-14

## 2015-09-14 MED ORDER — HYDROCODONE-ACETAMINOPHEN 7.5-325 MG PO TABS
ORAL_TABLET | Freq: Four times a day (QID) | ORAL | 0 refills | Status: DC
Start: 2015-09-14 — End: 2015-11-17

## 2015-09-14 NOTE — Progress Notes (Signed)
Follow up: SPINE    CHIEF COMPLAINT:    Chief Complaint   Patient presents with   ??? Neck Pain     fu neck, med refil       HISTORY OF PRESENT ILLNESS:                The patient is a 81 y.o. male here to follow up medication maintenance For chronic neck pain and cervical spondylosis.  Having increasing aching pain in his neck.  Overall little better since his wife now lives in a nursing home and he did not provide all of her care he has no radiating arm pain or progressive weakness he has pain from his neck to his shoulders      Current/Past Treatment:   ?? Physical Therapy: ??  ?? Chiropractic: ??  ?? Injection: last CESI January 2016 ??  ?? Medications: Duragesic 50 ??g q.48 hours of the Duragesic patch; hydrocodone 7.5 mg q.i.d. P.r.n. pain ??  ?? Surgery/Consult:    Function-Does the pain medication improve your ability to do:   ?? Personal care: Yes  ?? Housework: Yes   ?? Physical activity: Yes  ?? Social activity: Yes  ?? to work: Yes     Pain Scale: 1-10  With Meds:   4  Pain Scale: 1-10 Without Meds:10    Potential aberrant drug-related behavior:  ?? Aberrant behavior identified? NO  ?? Potential aberrant behavior identified? NO  ?? Reports loss are stolen prescriptions? NO  ?? Insist on certain medications by name? NO  ?? Purposeful oversedation? NO  ?? Increased dose without authorization? NO    Side Effects:     Past Medical History: Medical history form was reviewd today & scanned into the Media tab  Past Medical History   Diagnosis Date   ??? Cancer (HCC)      PROSTATE CANCER   ??? Diabetes mellitus (HCC)    ??? Pancreatitis 10/15/11        REVIEW OF SYSTEMS:   CONSTITUTIONAL: Denies unexplained weight loss, fevers, chills or fatigue  NEUROLOGIC: Denies tremors or seizures         PHYSICAL EXAM:    Vitals: Blood pressure 141/82, pulse 81, height 5' 10.08" (1.78 m), weight 175 lb 0.7 oz (79.4 kg).    GENERAL EXAM:  ?? General Apparence: Patient is adequately groomed with no evidence of malnutrition.  ?? Orientation: The patient is  oriented to time, place and person.   ?? Mood & Affect:The patient's mood and affect are appropriate   ?? Gait & station: normal, patient ambulates without assistance  ?? Vascular: Examination reveals no swelling tenderness in upper or lower extremities.    ?? Lymphatic: The lymphatic examination bilaterally reveals all areas to be without enlargement or induration  ?? Sensation: Sensation is intact without deficit  ?? Coordination/Balance: Good coordination     CERVICAL EXAMINATION:  ?? Inspection: Local inspection shows no step-off or bruising.  Cervical alignment is normal.     ?? Palpation: No evidence of tenderness at the midline, and trapezius.  Paraspinal tenderness is present. There is no step-off or paraspinal spasm.   ?? Range of Motion: Moderate loss of flexion and extension  ?? Strength: 5/5 bilateral upper extremities   ?? Special Tests:    ??   Spurling's, L'Hermitte's & Hoffman's negative bilaterally.   ??   Hawkins and Impingement tests are negative bilaterally.   ??  Cubital and Carpal tunnel Tinel's negative bilaterally.      ??  Skin:There are no rashes, ulcerations or lesions in right & left upper extremities.  ?? Reflexes: Bilaterally triceps, biceps and brachioradialis are Trace.  Clonus absent bilaterally at the feet.   ?? Gait & station: normal, patient ambulates without assistance     ?? Additional Examinations:       ?? RIGHT UPPER EXTREMITY:  Inspection/examination of the right upper extremity does not show any tenderness, deformity or injury. Range of motion is full. There is no gross instability.  There are no rashes, ulcerations or lesions. Strength and tone are normal.  ?? LEFT UPPER EXTREMITY: Inspection/examination of the left upper extremity does not show any tenderness, deformity or injury. Range of motion is full. There is no gross instability.  There are no rashes, ulcerations or lesions. Strength and tone are normal.    LUMBAR/SACRAL EXAMINATION:  ?? Inspection: Local inspection shows no step-off or  bruising.  Lumbar alignment is normal.  Sagittal and Coronal balance is neutral.      ?? Palpation:   No evidence of tenderness at the midline.  No tenderness bilaterally at the paraspinal or trochanters.  There is no step-off or paraspinal spasm.   ?? Range of Motion:     ?? Strength:   Strength testing is 5/5 in all muscle groups tested.   ?? Special Tests:   Straight leg raise and crossed SLR negative.  Leg length and pelvis level.  0 out of 5 Waddell's signs.        ?? Skin: There are no rashes, ulcerations or lesions.  ?? Reflexes: Reflexes are symmetrically 2+ at the patellar and ankle tendons.  Clonus absent bilaterally at the feet.  ?? Gait & station: normal, patient ambulates without assistance     ?? Additional Examinations:   ?? RIGHT LOWER EXTREMITY: Inspection/examination of the right lower extremity does not show any tenderness, deformity or injury. Range of motion is full. There is no gross instability.  There are no rashes, ulcerations or lesions. Strength and tone are normal.  ?? LEFT LOWER EXTREMITY:  Inspection/examination of the left lower extremity does not show any tenderness, deformity or injury. Range of motion is full. There is no gross instability. There are no rashes, ulcerations or lesions.  Strength and tone are normal.    Diagnostic Testing:     cervical MRI scan report reviewed 12/19/2014 showing multilevel DDD/spondylosis most notably C6 7 moderate central stenosis C3 4, C4 5  ??  UDS June 15, 2015: Positive for OXY as expected    Impression:    Mechanical neck pain, multilevel cervical spondylosis  Chronic shoulder pain followed by Dr. Paulina Fusi  Prostate cancer  Opiate Sharon Seller maintenance???low risk???ORT=0    Plan:      Duragesic 50 ??g q.48 hours as q.48 hours ??2 prescription; he just filled his remaining prescription today    Hydrocodone 7.5 mg q.i.d. 120??3 prescription    The risks and use of maintenance opiate medication were reviewed.  These include the risk of tolerance, addition, and abuse.   Potential side effects were also discussed.   Medications are to be taken as prescribed and not escalated without prior agreement.  OARRS/KASPER reviewed & appropriate.   Opiate medications will only be prescribed through the office of Dr Carmon Ginsberg. Donato Schultz, M. D. unless notified otherwise         Doxie Augenstein Donato Schultz

## 2015-11-02 ENCOUNTER — Ambulatory Visit
Admit: 2015-11-02 | Discharge: 2015-11-02 | Payer: MEDICARE | Attending: Physician Assistant | Primary: Internal Medicine

## 2015-11-02 DIAGNOSIS — M5030 Other cervical disc degeneration, unspecified cervical region: Secondary | ICD-10-CM

## 2015-11-02 MED ORDER — HYDROCODONE-ACETAMINOPHEN 7.5-325 MG PO TABS
ORAL_TABLET | Freq: Four times a day (QID) | ORAL | 0 refills | Status: DC
Start: 2015-11-02 — End: 2016-08-16

## 2015-11-02 MED ORDER — FENTANYL 50 MCG/HR TD PT72
50 MCG/HR | MEDICATED_PATCH | TRANSDERMAL | 0 refills | Status: DC
Start: 2015-11-02 — End: 2015-11-17

## 2015-11-02 MED ORDER — FENTANYL 50 MCG/HR TD PT72
50 MCG/HR | MEDICATED_PATCH | TRANSDERMAL | 0 refills | Status: DC
Start: 2015-11-02 — End: 2016-08-16

## 2015-11-02 NOTE — Progress Notes (Signed)
Follow up: SPINE    CHIEF COMPLAINT:    Chief Complaint   Patient presents with   ??? Neck Pain     fu neck       HISTORY OF PRESENT ILLNESS:                The patient is a 81 y.o. male here to follow up medication maintenance for history of subacute/chronic aching left-sided neck/trap pain extending into the shoulder.  Symptoms increased with any prolonged activity or cervical range of motion.  Relief with resting.  Still notes significant improvement with Duragesic 50mcg 1 q 48 and Norco 7.5/325 1 po QID. at this time denies any distal radiating pain, no progressive numbness tingling weakness.  [facility]  States his pain medication doesn't allow him to be more functional, able to perform ADLs independently .    Function-Does the pain medication improve your ability to do:   ?? Personal care: Yes  ?? Housework: Yes  ?? 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 Social activity: Yes, able to perform ADLs  ??  Pain Scale: 1-10 With Meds: 5/10  Pain Scale: 1-10 Without Meds: 9/10  ??  Potential aberrant drug-related behavior:  ?? Aberrant behavior identified? NO  ?? Potential aberrant behavior identified? NO  ?? Reports loss are stolen prescriptions? NO  ?? Insist on certain medications by name? NO  ?? Purposeful oversedation? NO  ?? Increased dose without authorization? NO  ??  Side Effects: Denies   ??  Current/Past Treatment:   ?? Physical Therapy: past  ?? Injection: LC7-T1 CESI 12-2014 w/improvements   ?? Medications: NSAIDs, Duragesic 50mcg q48 & Norco 7.5 QID PRN  Surgery/Consult: Dr. Paulina FusiHess      Past Medical History: Medical history form was reviewd today & scanned into the Media tab  Past Medical History   Diagnosis Date   ??? Cancer (HCC)      PROSTATE CANCER   ??? Diabetes mellitus (HCC)    ??? Pancreatitis 10/15/11        REVIEW OF SYSTEMS:   CONSTITUTIONAL: Denies unexplained weight loss, fevers, chills or fatigue  NEUROLOGIC: Denies  tremors or seizures         PHYSICAL EXAM:    Vitals: Blood pressure (!) 145/70, pulse 82, height 5' 10.08" (1.78 m), weight 175 lb 0.7 oz (79.4 kg).    GENERAL EXAM:  ?? General Apparence: Patient is adequately groomed with no evidence of malnutrition.  ?? Orientation: The patient is oriented to time, place and person.   ?? Mood & Affect:The patient's mood and affect are appropriate  ?? Vascular: Examination reveals no swelling tenderness in upper or lower extremities.    ?? Lymphatic: The lymphatic examination bilaterally reveals all areas to be without enlargement or induration  ?? Sensation: Sensation is intact without deficit  ?? Coordination/Balance: Good coordination     CERVICAL EXAMINATION:  ?? Inspection: Local inspection shows no step-off or bruising.  Cervical alignment is normal.     ?? Palpation: No evidence of tenderness at the midline, and trapezius.  Paraspinal tenderness is present. There is no step-off or paraspinal spasm.   ?? Range of Motion: Mild loss of flexion, moderate to severely limited extension and lateral rotation  ?? Strength: 5/5 bilateral upper extremities   ?? Special Tests:    ??   Spurling's, L'Hermitte's & Hoffman's negative bilaterally.   ??  Cubital and  Carpal tunnel Tinel's negative bilaterally.      ?? Skin:There are no rashes, ulcerations or lesions in right & left upper extremities.  ?? Reflexes: Bilaterally triceps, biceps and brachioradialis are 1+.  Clonus absent bilaterally at the feet.   ?? Gait & station: normal, patient ambulates without assistance     ?? Additional Examinations:       ?? RIGHT UPPER EXTREMITY:  Inspection/examination of the right upper extremity does not show any tenderness, deformity or injury. Range of motion is full. There is no gross instability.  There are no rashes, ulcerations or lesions. Strength and tone are normal.  ?? LEFT UPPER EXTREMITY: Inspection/examination of the left upper extremity does not show any tenderness, deformity or injury. Range of motion is  full. There is no gross instability.  There are no rashes, ulcerations or lesions. Strength and tone are normal.   e is no gross instability. There are no rashes, ulcerations or lesions.  Strength and tone are normal.      Diagnostic Testing:   Updated cervical MRI scan report reviewed 12/19/2014 showing multilevel DDD/spondylosis most notably C6 7 moderate central stenosis C3 4, C4 5    UDS June 2016 positive for OxyIR as expected  ??  ??  ??  Impression:   1) Subacute/chronic left neck/trap pain, multilevel spondylosis  2) chronic shoulder pain, s/p sx last w/Dr. Paulina Fusi    3) H/o prostate cancer   4) Opioid maintenance, low risk per ORT     ??  Plan:   1) Duragesic 50 mcg patch apply 1 q48hrs #15, 2 separate one month scripts, he states he still has one prescription to fill  2) Norco 7.5/325 i po QID #120 2 separate one month prescription, still has one rx to fill   3) Left C7-T1 CESI #2 with IV sedation   4) F/u 74mo      The risks and use of maintenance opiate medication were reviewed.  These include the risk of tolerance, addition, and abuse.  Potential side effects were also discussed.   Medications are to be taken as prescribed and not escalated without prior agreement.  OARRS/KASPER reviewed & appropriate.   Opiate medications will only be prescribed through the office of Dr Carmon Ginsberg. Jordan Gutierrez, M. D. unless notified otherwise         Jordan Gutierrez [facility]

## 2015-11-07 NOTE — Progress Notes (Signed)
Attempted to contact patient.  No answer.  Message left at 3:25 PM on 11/07/2015.

## 2015-11-15 NOTE — Telephone Encounter (Signed)
[id number]  [id number] - OP SX AUTH NPR FOR MEDICARE - LEFT C7 T1 ESI - [facility] WITH [facility] - MEDICARE

## 2015-11-16 NOTE — H&P (Signed)
HISTORY AND PHYSICAL/PRE-SEDATION ASSESSMENT    Patient:  Chino, Sardo   DOB:  [birth year]  Medical Record No.:  [id number]   Date:  11-17-15  Physician:  Pierre Bali, M.D.  Facility: [facility]     Nursing History and Physical reviewed and agreed upon.      Additional findings:    Allergies:  Review of patient's allergies indicates no known allergies.    Home Medications:    Prior to Admission medications    Medication Sig Start Date End Date Taking? Authorizing Provider   HYDROcodone-acetaminophen (NORCO) 7.5-325 MG per tablet Take 1 tablet by mouth 4 times daily May fill 12/16/15 11/02/15   Megan Roxana Hires, PA-C   HYDROcodone-acetaminophen [facility]) 7.5-325 MG per tablet Take 1 tablet by mouth 4 times daily May fill 01/14/16 11/02/15   Megan Roxana Hires, PA-C   fentaNYL (DURAGESIC) 50 MCG/HR i q 48hrs     May fill 12/08/15 11/02/15   Megan Roxana Hires, PA-C   fentaNYL (DURAGESIC) 50 MCG/HR i q48 hours    May fill 01/06/16 11/02/15   Megan Roxana Hires, PA-C   HYDROcodone-acetaminophen [facility]) 7.5-325 MG per tablet Take 1 tablet by mouth 4 times daily May fill 09/20/15 09/14/15   Skarlette Lattner Donato Schultz, MD   HYDROcodone-acetaminophen [facility]) 7.5-325 MG per tablet Take 1 tablet by mouth 4 times daily May fill 10/18/15 09/14/15   Pierre Bali, MD   HYDROcodone-acetaminophen [facility]) 7.5-325 MG per tablet Take 1 tablet by mouth 4 times daily May fill 11/16/15 09/14/15   Pierre Bali, MD   fentaNYL (DURAGESIC) 50 MCG/HR Apply q48    To fill 07-07-15 06/15/15   Pierre Bali, MD   fentaNYL (DURAGESIC) 50 MCG/HR Apply q48    To fill 08-05-15 06/15/15   Pierre Bali, MD   fentaNYL (DURAGESIC) 50 MCG/HR Apply q48    To fill 09-03-15 06/15/15   Pierre Bali, MD   HYDROcodone-acetaminophen [facility]) 7.5-325 MG per tablet i po QID PRN    To fill on or after 06-24-15 06/15/15   Coreena Rubalcava Donato Schultz, MD   HYDROcodone-acetaminophen [facility]) 7.5-325 MG per tablet i po QID  PRN    To fill on or after 07-22-15 06/15/15   Madaline Lefeber Donato Schultz, MD   HYDROcodone-acetaminophen [facility]) 7.5-325 MG per tablet i po QID PRN    To fill on or after 08-20-15 06/15/15   Pierre Bali, MD   HYDROcodone-acetaminophen [facility]) 7.5-325 MG per tablet Take 1 tablet by mouth 4 times daily May fill 03/21/15 03/14/15   Megan Roxana Hires, PA-C   HYDROcodone-acetaminophen [facility]) 7.5-325 MG per tablet Take 1 tablet by mouth 4 times daily May fill 04/19/15 03/14/15   Megan Roxana Hires, PA-C   HYDROcodone-acetaminophen [facility]) 7.5-325 MG per tablet Take 1 tablet by mouth 4 times daily May fill 05/17/15 03/14/15   Megan Roxana Hires, PA-C   fentaNYL (DURAGESIC) 50 MCG/HR 1 Q 48 HRS       TO FILL ON OR AFTER 02/25/2015 12/13/14   Megan Roxana Hires, PA-C   HYDROcodone-acetaminophen [facility]) 7.5-325 MG per tablet 1 PO QID       FILL ON OR AFTER 02/15/2015 12/13/14   Megan Roxana Hires, PA-C   pregabalin (LYRICA) 50 MG capsule Take 50 mg by mouth 2 times daily.    Historical Provider, MD   finasteride (PROSCAR) 5 MG tablet Take 5 mg by mouth daily.    Historical Provider, MD   METFORMIN HCL  PO Take  by mouth. TAKE 2 TABLETS TWICE A DAY     Historical Provider, MD   escitalopram (LEXAPRO) 10 MG tablet Take 10 mg by mouth daily.      Historical Provider, MD   ZOLPIDEM TARTRATE PO Take  by mouth nightly.      Historical Provider, MD       Vitals: Stable       PHYSICAL EXAM:  HENT: Airway patent and reviewed  Cardiovascular: Normal rate, regular rhythm, normal heart sounds.   Pulmonary/Chest: No wheezes. No rhonchi. No rales.   Abdominal: Soft. Bowel sounds are normal. No distension.    ASA CLASS:         []    I. Normal, healthy adult           [x]    II.  Mild systemic disease            []    III.  Severe systemic disease      Sedation plan:   [x]   Local              [x]   Minimal                  []   General anesthesia    Patient's condition acceptable for planned procedure/sedation.   Post Procedure  Plan   Return to same level of care   ______________________     The risks and benefits as well as alternatives to the procedure have been discussed with the patient and or family.  The patient and or next of kin understands and agrees to proceed.    Pierre BaliF Clifford Loma Dubuque, M.D.

## 2015-11-17 ENCOUNTER — Inpatient Hospital Stay: Attending: Physical Medicine & Rehabilitation | Primary: Internal Medicine

## 2015-11-17 NOTE — Progress Notes (Signed)
NURSING CARE PLANS FOLLOWED     ?? Potential for anxiety-decrease anxiety-allow patient to verbalize  ?? Potential for infection-no infection-proper infection controls  ?? Potential for fall the patient will move to fall risk after procedure- through the recovery  phase   ?? Orient to environment  ?? Call light in reach  ?? Instruct to call for assistance prior to getting up  ?? Non skid footwear on   ?? Bed wheels locked and bed in lowest position - siderails up times two  ?? Potential for deep sedation-no deep sedation-know maximum allowable dose         adverse reactions and nursing considerations. Assess VS and LOC

## 2015-11-17 NOTE — Progress Notes (Signed)
Patient did not stop his aspirin 81 mg. Spoke with Dr. Eduardo OsierValentin and he wants to reschedule. Informed patient and son Jordan Gutierrez.

## 2015-12-03 NOTE — H&P (Signed)
HISTORY AND PHYSICAL/PRE-SEDATION ASSESSMENT    Patient:  Jordan Gutierrez, Jordan Gutierrez   DOB:  [birth year]  Medical Record No.:  [id number]   Date:   12-04-15  Physician:  Pierre BaliF Clifford Camora Tremain, M.D.  [facility]     Nursing History and Physical reviewed and agreed upon.      Additional findings:    Allergies:  Review of patient's allergies indicates no known allergies.    Home Medications:    Prior to Admission medications    Medication Sig Start Date End Date Taking? Authorizing Provider   aspirin 81 MG tablet Take 81 mg by mouth daily    Historical Provider, MD   HYDROcodone-acetaminophen (NORCO) 7.5-325 MG per tablet Take 1 tablet by mouth 4 times daily May fill 12/16/15 11/02/15   Megan Roxana HiresMarie Stanfield, PA-C   HYDROcodone-acetaminophen [facility](NORCO) 7.5-325 MG per tablet Take 1 tablet by mouth 4 times daily May fill 01/14/16 11/02/15   Megan Roxana HiresMarie Stanfield, PA-C   fentaNYL (DURAGESIC) 50 MCG/HR i q48 hours    May fill 01/06/16 11/02/15   Megan Roxana HiresMarie Stanfield, PA-C   HYDROcodone-acetaminophen [facility](NORCO) 7.5-325 MG per tablet Take 1 tablet by mouth 4 times daily May fill 10/18/15 09/14/15   Pierre BaliF Clifford Shaun Runyon, MD   HYDROcodone-acetaminophen [facility](NORCO) 7.5-325 MG per tablet Take 1 tablet by mouth 4 times daily May fill 11/16/15 09/14/15   Pierre BaliF Clifford Kmari Brian, MD   pregabalin (LYRICA) 50 MG capsule Take 50 mg by mouth 2 times daily.    Historical Provider, MD   finasteride (PROSCAR) 5 MG tablet Take 5 mg by mouth daily.    Historical Provider, MD   METFORMIN HCL PO Take  by mouth. TAKE 2 TABLETS TWICE A DAY     Historical Provider, MD   escitalopram (LEXAPRO) 10 MG tablet Take 10 mg by mouth daily.      Historical Provider, MD   ZOLPIDEM TARTRATE PO Take  by mouth nightly.      Historical Provider, MD       Vitals: Stable       PHYSICAL EXAM:  HENT: Airway patent and reviewed  Cardiovascular: Normal rate, regular rhythm, normal heart sounds.   Pulmonary/Chest: No wheezes. No rhonchi. No rales.   Abdominal: Soft.  Bowel sounds are normal. No distension.    ASA CLASS:         []    I. Normal, healthy adult           [x]    II.  Mild systemic disease            []    III.  Severe systemic disease      Sedation plan:   [x]   Local              [x]   Minimal                  []   General anesthesia    Patient's condition acceptable for planned procedure/sedation.   Post Procedure Plan   Return to same level of care   ______________________     The risks and benefits as well as alternatives to the procedure have been discussed with the patient and or family.  The patient and or next of kin understands and agrees to proceed.    Pierre BaliF Clifford Tyrese Ficek, M.D.

## 2015-12-04 ENCOUNTER — Inpatient Hospital Stay: Admit: 2015-12-04 | Attending: Physical Medicine & Rehabilitation | Primary: Internal Medicine

## 2015-12-04 LAB — POCT GLUCOSE: POC Glucose: 122 mg/dl — ABNORMAL HIGH (ref 70–99)

## 2015-12-04 MED ORDER — IOHEXOL 240 MG/ML IJ SOLN
240 MG/ML | INTRAMUSCULAR | Status: AC
Start: 2015-12-04 — End: ?

## 2015-12-04 MED ORDER — LIDOCAINE HCL (PF) 1 % IJ SOLN
1 % | INTRAMUSCULAR | Status: AC
Start: 2015-12-04 — End: 2015-12-04

## 2015-12-04 MED ORDER — SODIUM CHLORIDE 0.9 % IJ SOLN
0.9 % | INTRAMUSCULAR | Status: AC
Start: 2015-12-04 — End: ?

## 2015-12-04 MED ORDER — LACTATED RINGERS IV SOLN
INTRAVENOUS | Status: DC
Start: 2015-12-04 — End: 2015-12-05
  Administered 2015-12-04: 14:00:00 via INTRAVENOUS

## 2015-12-04 MED ORDER — LIDOCAINE HCL 1 % (PF) IJ SOLN 2 ML
1 % (PF) | Freq: Once | INTRAMUSCULAR | Status: DC
Start: 2015-12-04 — End: 2015-12-05

## 2015-12-04 MED ORDER — DEXAMETHASONE SOD PHOSPHATE PF 10 MG/ML IJ SOLN
10 MG/ML | INTRAMUSCULAR | Status: AC
Start: 2015-12-04 — End: ?

## 2015-12-04 MED ORDER — LACTATED RINGERS IV SOLN
INTRAVENOUS | Status: AC
Start: 2015-12-04 — End: 2015-12-04

## 2015-12-04 MED ORDER — MIDAZOLAM HCL 2 MG/2ML IJ SOLN
2 MG/ML | INTRAMUSCULAR | Status: AC
Start: 2015-12-04 — End: ?

## 2015-12-04 MED FILL — LACTATED RINGERS IV SOLN: INTRAVENOUS | Qty: 1000

## 2015-12-04 MED FILL — SODIUM CHLORIDE 0.9 % IJ SOLN: 0.9 % | INTRAMUSCULAR | Qty: 10

## 2015-12-04 MED FILL — DEXAMETHASONE SOD PHOSPHATE PF 10 MG/ML IJ SOLN: 10 MG/ML | INTRAMUSCULAR | Qty: 1

## 2015-12-04 MED FILL — LIDOCAINE HCL (PF) 1 % IJ SOLN: 1 % | INTRAMUSCULAR | Qty: 2

## 2015-12-04 MED FILL — MIDAZOLAM HCL 2 MG/2ML IJ SOLN: 2 MG/ML | INTRAMUSCULAR | Qty: 2

## 2015-12-04 MED FILL — OMNIPAQUE 240 MG/ML IJ SOLN: 240 MG/ML | INTRAMUSCULAR | Qty: 50

## 2015-12-04 NOTE — Op Note (Signed)
Patient:  Jordan Gutierrez, Jordan Gutierrez   Medical Record #:  [id number]   Date:  12/04/2015  Physician:  Pierre BaliF Clifford Anivea Velasques, M.D.  [facility]     Pre-op diagnosis:  Cervical radiculitis, cervical spondylosis  Post-op diagnosis:  same  Procedure:  Left C7/T1 cervical interlaminar epidural injection #1 with flouroscopic guidance  Anesthesia: Conscious sedation with 1mg  Versed   Procedure Note:    The patient was admitted through pre-op and written consent was obtained.  The patient was advised of the risks and benefits of the procedure, including but not limited to the following: bleeding, pain, infection, temporary paralysis, nerve damage and spinal headache.  The patient was given the opportunity to ask questions.  There were no contraindications for this procedure.    The appropriate area was prepped and draped in a sterile fashion.  Landmarks were identified and marked.  The skin and soft tissues were anesthetized with 1% lidocaine.  A 22G 3.5inch Touhy needle was advanced to the left C7/T1 interlaminar space using fluoroscopic guidance confirmed by multiple views showing appropriate needle placement.  Injection of contrast showed epidural flow.  There were no signs of intravascular or intrathecal injection.    10 mg dexamethasone and 2 mL normal saline solution were then injected.     There were no complications and the patient tolerated the procedure well.  The patient was transferred to the recovery area and monitored.  Discharge instructions were given.  The patient is to contact me for any post-procedure concerns.  The patient is to follow up as scheduled.    LOR:2.5 cm    Storey Stangeland Donato Schultzlifford Amando Chaput, MD

## 2015-12-04 NOTE — Progress Notes (Signed)
Patient arrived in Post op phase 2 on cart.  Report from RN.  Site of injection without redness, drainage or bleeding.  Patient denies numbness, tingling or weakness.  Moves extremities x 4.

## 2015-12-04 NOTE — Progress Notes (Signed)
dexmethasone 10mg /ml - 1ml  omnipaque 240mg /ml - 5ml  Sodium chloride 0.9% - 3ml

## 2015-12-04 NOTE — Progress Notes (Signed)
Pt arrived to PACU,A&O,drowsy, no c/o pain and/or numbness or tingling, vitals stable, site unremarkable

## 2015-12-04 NOTE — Op Note (Signed)
POST SEDATION ASSESSMENT      Patient:  Jordan Gutierrez, Jordan Gutierrez   DOB:[name]  [birth year]  Medical Record No.:  [id number]   Date:  12/04/2015  Physician:  Pierre BaliF Clifford Deantre Bourdon, M.D.      Patient location: Recovery  Level of consciousness: Awake, Alert, Oriented  Pain Control: Good  Respiration: Adequate  Post-op assessment: No sedation complications    Last Vitals:   Vitals:    12/04/15 1021   BP: 126/68   Pulse: 83   Resp: 20   Temp:    SpO2:      Post-op Vitals: Stable    [name]  10:42 AM

## 2015-12-04 NOTE — Progress Notes (Signed)
Discharge instructions given to son and verbalized understanding, states pain is at a tolerable level, no numbness or weakness noted,vitals stable, pt ambulated out to car without complications, son driving home

## 2015-12-04 NOTE — Progress Notes (Signed)
Mindy Priede and Carey Jelus present in room during procedure.

## 2015-12-04 NOTE — Progress Notes (Signed)
NURSING CARE PLANS FOLLOWED     ?? Potential for anxiety-decrease anxiety-allow patient to verbalize  ?? Potential for infection-no infection-proper infection controls  ?? Potential for fall the patient will move to fall risk after procedure- through the recovery  phase   ?? Orient to environment  ?? Call light in reach  ?? Instruct to call for assistance prior to getting up  ?? Non skid footwear on   ?? Bed wheels locked and bed in lowest position - siderails up times two  ?? Potential for deep sedation-no deep sedation-know maximum allowable dose         adverse reactions and nursing considerations. Assess VS and LOC

## 2015-12-04 NOTE — Progress Notes (Signed)
fsbs 122

## 2015-12-07 ENCOUNTER — Encounter: Attending: Physician Assistant | Primary: Internal Medicine

## 2016-01-11 ENCOUNTER — Ambulatory Visit
Admit: 2016-01-11 | Discharge: 2016-01-11 | Payer: MEDICARE | Attending: Physician Assistant | Primary: Internal Medicine

## 2016-01-11 DIAGNOSIS — M5030 Other cervical disc degeneration, unspecified cervical region: Secondary | ICD-10-CM

## 2016-01-11 MED ORDER — FENTANYL 50 MCG/HR TD PT72
50 MCG/HR | MEDICATED_PATCH | TRANSDERMAL | 0 refills | Status: DC
Start: 2016-01-11 — End: 2016-04-04

## 2016-01-11 MED ORDER — FENTANYL 50 MCG/HR TD PT72
50 MCG/HR | MEDICATED_PATCH | TRANSDERMAL | 0 refills | Status: DC
Start: 2016-01-11 — End: 2016-08-16

## 2016-01-11 MED ORDER — HYDROCODONE-ACETAMINOPHEN 7.5-325 MG PO TABS
ORAL_TABLET | ORAL | 0 refills | Status: DC
Start: 2016-01-11 — End: 2016-04-04

## 2016-01-11 MED ORDER — HYDROCODONE-ACETAMINOPHEN 7.5-325 MG PO TABS
ORAL_TABLET | ORAL | 0 refills | Status: DC
Start: 2016-01-11 — End: 2016-08-16

## 2016-01-11 NOTE — Progress Notes (Signed)
Follow up: SPINE    CHIEF COMPLAINT:    Chief Complaint   Patient presents with   ??? Back Pain       HISTORY OF PRESENT ILLNESS:                The patient is a 82 y.o. male here to follow up medication maintenance and after left C7-T1 CESI #1 from 12/04/2015 for history of subacute/chronic aching left-sided neck/trap pain extending into the shoulder. Symptoms increased with any prolonged activity or cervical range of motion. Relief with resting.  Overall states he is 60% improved following recent ESI.  Still notes significant improvement with Duragesic 1 q 48 and Norco 7.5/325 1 po QID. At this time denies any distal radiating pain, no progressive numbness tingling weakness. States his pain medication doesn't allow him to be more functional, able to perform ADLs independently, taking care of his wife currently in a nursing facility.  He denies any side effects to recent procedure  ??  Function-Does the pain medication improve your ability to do:   ?? Personal care: Yes  ?? Housework: Yes  ?? 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 Social activity: Yes, able to perform ADLs  ????  Pain Scale: 1-10 With Meds: 5/10  Pain Scale: 1-10 Without Meds: 9/10  ????  Potential aberrant drug-related behavior:  ?? Aberrant behavior identified? NO  ?? Potential aberrant behavior identified? NO  ?? Reports loss are stolen prescriptions? NO  ?? Insist on certain medications by name? NO  ?? Purposeful oversedation? NO  ?? Increased dose without authorization? NO  ????  Side Effects: Denies   ????  Current/Past Treatment:   ?? Physical Therapy: past  ?? Injection:   01/03/15: Lt C7-T1 CESI  12/04/15: Lt C7-T1 CESI --60%  ?? Medications: NSAIDs, Duragesic q48 & Norco 7.5 QID PRN  Surgery/Consult: Dr. Paulina Fusi  ??  ??    Past Medical History: Medical history form was reviewd today & scanned into the Media tab  Past Medical History   Diagnosis Date   ??? Cancer (HCC)      PROSTATE CANCER   ???  Diabetes mellitus (HCC)    ??? Pancreatitis 10/15/11        REVIEW OF SYSTEMS:   CONSTITUTIONAL: Denies unexplained weight loss, fevers, chills or fatigue  NEUROLOGIC: Denies tremors or seizures         PHYSICAL EXAM:    Vitals: Blood pressure 129/61, pulse 82, height 5' 10.08" (1.78 m), weight 179 lb 14.3 oz (81.6 kg).    GENERAL EXAM:  ?? General Apparence: Patient is adequately groomed with no evidence of malnutrition.  ?? Orientation: The patient is oriented to time, place and person.   ?? Mood & Affect:The patient's mood and affect are appropriate  ?? Vascular: Examination reveals no swelling tenderness in upper or lower extremities.    ?? Lymphatic: The lymphatic examination bilaterally reveals all areas to be without enlargement or induration  ?? Sensation: Sensation is intact without deficit  ?? Coordination/Balance: Good coordination     CERVICAL EXAMINATION:  ?? Inspection: Local inspection shows no step-off or bruising.  Cervical alignment is normal.     ?? Palpation: No evidence of tenderness at the midline, and trapezius.  Paraspinal tenderness is present. There is no step-off or paraspinal spasm.   ?? Range of Motion: Intact flexion moderate to severely limited extension, moderate loss of the lateral rotations but improved  since recent ESI  ?? Strength: 5/5 bilateral upper extremities   ?? Special Tests:    ??   Spurling's, L'Hermitte's & Hoffman's negative bilaterally.   ??  Cubital and Carpal tunnel Tinel's negative bilaterally.      ?? Skin:There are no rashes, ulcerations or lesions in right & left upper extremities.  ?? Reflexes: Bilaterally triceps, biceps and brachioradialis are 1+.  Clonus absent bilaterally at the feet.   ?? Gait & station: normal, patient ambulates without assistance     ?? Additional Examinations:       ?? RIGHT UPPER EXTREMITY:  Inspection/examination of the right upper extremity does not show any tenderness, deformity or injury. Range of motion is full. There is no gross instability.  There  are no rashes, ulcerations or lesions. Strength and tone are normal.  ?? LEFT UPPER EXTREMITY: Inspection/examination of the left upper extremity does not show any tenderness, deformity or injury. Range of motion is full. There is no gross instability.  There are no rashes, ulcerations or lesions. Strength and tone are normal.  Diagnostic Testing:   Updated cervical MRI scan report reviewed 12/19/2014 showing multilevel DDD/spondylosis most notably C6 7 moderate central stenosis C3 4, C4 5  ??  UDS June 2016 positive for OxyIR as expected  ????  ????  ????  Impression:   1) Subacute/chronic left neck/trap pain, multilevel spondylosis, now stable   2) chronic shoulder pain, s/p sx last w/Dr. Paulina FusiHess    3) H/o prostate cancer   4) Opioid maintenance, low risk per ORT   ??  ????  Plan:   1) Duragesic 50 mcg patch apply 1 q48hrs #15,  3 separate one month scripts  2) Norco 7.5/325 i po QID #120 3 separate one month prescriptions  3) Can call for Left C7-T1 CESI #2 with IV sedation if flare up  4) F/u 69mo; despite higher opioid dosages he does note significant improvement of his pain and functionality (see above) without any side effects.  Patient has been well managed chronically    The risks and use of maintenance opiate medication were reviewed.  These include the risk of tolerance, addition, and abuse.  Potential side effects were also discussed.   Medications are to be taken as prescribed and not escalated without prior agreement.  OARRS/KASPER reviewed & appropriate.   Opiate medications will only be prescribed through the office of Dr Carmon GinsbergF. Donato Schultzlifford Valentin, M. D. unless notified otherwise         Wonda CeriseMegan Marie Lennie Dunnigan  [facility]

## 2016-04-04 ENCOUNTER — Ambulatory Visit
Admit: 2016-04-04 | Discharge: 2016-04-04 | Payer: MEDICARE | Attending: Physician Assistant | Primary: Internal Medicine

## 2016-04-04 DIAGNOSIS — M5030 Other cervical disc degeneration, unspecified cervical region: Secondary | ICD-10-CM

## 2016-04-04 MED ORDER — FENTANYL 50 MCG/HR TD PT72
50 MCG/HR | MEDICATED_PATCH | TRANSDERMAL | 0 refills | Status: DC
Start: 2016-04-04 — End: 2016-08-16

## 2016-04-04 MED ORDER — HYDROCODONE-ACETAMINOPHEN 7.5-325 MG PO TABS
ORAL_TABLET | ORAL | 0 refills | Status: DC
Start: 2016-04-04 — End: 2016-08-16

## 2016-04-04 MED ORDER — HYDROCODONE-ACETAMINOPHEN 7.5-325 MG PO TABS
ORAL_TABLET | ORAL | 0 refills | Status: DC
Start: 2016-04-04 — End: 2016-09-10

## 2016-04-04 NOTE — Progress Notes (Signed)
Follow up: SPINE    CHIEF COMPLAINT:    Chief Complaint   Patient presents with   ??? Neck Pain       HISTORY OF PRESENT ILLNESS:                The patient is a 82 y.o. male here to follow up medication maintenance history of chronic aching left-sided neck/trap pain.  Symptoms increase with prolonged activity or cervical range of motion.  Relief with resting.  Good relief with intermittent ESI's.  Pain is well managed with Duragesic 50 ??g q48 and Norco 7.5/325 i po QID PRN w/out side effects.  States medication does allow him to be more functional able to perform ADLs independently.  Still taking care of his wife who is currently in a nursing facility.  Currently denies any distal radiating pain, no progressive numbness tingling or weakness.      Pain Assessment  Location of Pain: Neck  Severity of Pain: 6  Quality of Pain: Aching  Duration of Pain: Persistent  Frequency of Pain: Intermittent  Aggravating Factors: Bending, Stretching, Straightening, Exercise  Relieving Factors: Rest  Result of Injury: No  Work-Related Injury: No  Are there other pain locations you wish to document?: No    Current/Past Treatment:   ?? Physical Therapy: Yes  ?? Injection:   ?? 01/03/15: Lt C7-T1 CESI  12/04/15: Lt C7-T1 CESI---60% improved     ?? Medications:   Duragesic 50 ??g q48 and Norco 7.5/325 i po QID PRN   ?? Surgery/Consult: Shoulder, Dr Paulina FusiHess    Function-Does the pain medication improve your ability to do:   ?? Personal care: Yes  ?? Housework: Yes   ?? Physical activity: Yes  ?? Social activity: Yes    Pain Scale: 1-10  With Meds:  2/10  Pain Scale: 1-10 Without Meds:  8/10     Potential aberrant drug-related behavior:  ?? Aberrant behavior identified? NO  ?? Potential aberrant behavior identified? NO  ?? Reports loss are stolen prescriptions? NO  ?? Insist on certain medications by name? NO  ?? Purposeful oversedation? NO  ?? Increased dose without authorization? NO    Side Effects:     Past Medical History: Medical history form was reviewd  today & scanned into the Media tab  Past Medical History   Diagnosis Date   ??? Cancer (HCC)      PROSTATE CANCER   ??? Diabetes mellitus (HCC)    ??? Pancreatitis 10/15/11        REVIEW OF SYSTEMS:   CONSTITUTIONAL: Denies unexplained weight loss, fevers, chills or fatigue  NEUROLOGIC: Denies tremors or seizures         PHYSICAL EXAM:    Vitals: Blood pressure (!) 162/83, pulse 96, height 5' 10.08" (1.78 m), weight 179 lb 14.3 oz (81.6 kg).    GENERAL EXAM:  ?? General Apparence: Patient is adequately groomed with no evidence of malnutrition.  ?? Orientation: The patient is oriented to time, place and person.   ?? Mood & Affect:The patient's mood and affect are appropriate  ?? Vascular: Examination reveals no swelling tenderness in upper or lower extremities.    ?? Lymphatic: The lymphatic examination bilaterally reveals all areas to be without enlargement or induration  ?? Sensation: Sensation is intact without deficit  ?? Coordination/Balance: Good coordination     CERVICAL EXAMINATION:  ?? Inspection: Local inspection shows no step-off or bruising.  Cervical alignment is normal.     ?? Palpation: No evidence  of tenderness at the midline, and trapezius.  Paraspinal tenderness is present. There is no step-off or paraspinal spasm.   ?? Range of Motion: Moderate loss of flexion moderate to severely limited extension and lateral rotation  ?? Strength: 5/5 bilateral upper extremities   ?? Special Tests:    ??   Spurling's, L'Hermitte's & Hoffman's negative bilaterally.   ??  Cubital and Carpal tunnel Tinel's negative bilaterally.      ?? Skin:There are no rashes, ulcerations or lesions in right & left upper extremities.  ?? Reflexes: Bilaterally triceps, biceps and brachioradialis are 1+.  Clonus absent bilaterally at the feet.   ?? Gait & station: normal, patient ambulates without assistance     ?? Additional Examinations:       ?? RIGHT UPPER EXTREMITY:  Inspection/examination of the right upper extremity does not show any tenderness,  deformity or injury. Range of motion is full. There is no gross instability.  There are no rashes, ulcerations or lesions. Strength and tone are normal.  ?? LEFT UPPER EXTREMITY: Inspection/examination of the left upper extremity does not show any tenderness, deformity or injury. Range of motion is full. There is no gross instability.  There are no rashes, ulcerations or lesions. Strength and tone are normal.    Diagnostic Testing:   Updated cervical MRI scan report reviewed 12/19/2014 showing multilevel DDD/spondylosis most notably C6 7 moderate central stenosis C3 4, C4 5  ??  UDS June 2016 positive for OxyIR as expected  ????  ????  ????  Impression:   1) Subacute/chronic left neck/trap pain, multilevel spondylosis  2) chronic shoulder pain, s/p sx last w/Dr. Paulina Fusi    3) H/o prostate cancer   4) Opioid maintenance, low risk per ORT       Plan:   1) Duragesic 50 mcg patch apply 1 q48hrs #15 x2 scripts  2) Norco 7.5/325 i po QID #120 x2 scripts  3) F/u 59mo      The risks and use of maintenance opiate medication were reviewed.  These include the risk of tolerance, addition, and abuse.  Potential side effects were also discussed.   Medications are to be taken as prescribed and not escalated without prior agreement.  OARRS/KASPER reviewed & appropriate.   Opiate medications will only be prescribed through the office of Dr Carmon Ginsberg. Donato Schultz, M. D. unless notified otherwise            Dictated by Marene Lenz, PA-C, also seen & evaluated by Dr. Donato Schultz

## 2016-05-30 ENCOUNTER — Ambulatory Visit
Admit: 2016-05-30 | Discharge: 2016-05-30 | Payer: MEDICARE | Attending: Physician Assistant | Primary: Internal Medicine

## 2016-05-30 DIAGNOSIS — M5030 Other cervical disc degeneration, unspecified cervical region: Secondary | ICD-10-CM

## 2016-05-30 MED ORDER — HYDROCODONE-ACETAMINOPHEN 7.5-325 MG PO TABS
ORAL_TABLET | Freq: Four times a day (QID) | ORAL | 0 refills | Status: DC
Start: 2016-05-30 — End: 2016-08-16

## 2016-05-30 MED ORDER — FENTANYL 50 MCG/HR TD PT72
50 MCG/HR | MEDICATED_PATCH | TRANSDERMAL | 0 refills | Status: DC
Start: 2016-05-30 — End: 2016-09-10

## 2016-05-30 MED ORDER — FENTANYL 50 MCG/HR TD PT72
50 MCG/HR | MEDICATED_PATCH | TRANSDERMAL | 0 refills | Status: AC
Start: 2016-05-30 — End: 2016-07-15

## 2016-05-30 MED ORDER — FENTANYL 50 MCG/HR TD PT72
50 MCG/HR | MEDICATED_PATCH | TRANSDERMAL | 0 refills | Status: AC
Start: 2016-05-30 — End: 2016-08-12

## 2016-05-30 NOTE — Progress Notes (Signed)
Follow up: SPINE    CHIEF COMPLAINT:    Chief Complaint   Patient presents with   ??? Neck Pain       HISTORY OF PRESENT ILLNESS:                The patient is a 82 y.o. male here to follow up medication maintenance maintenance history of chronic aching left-sided neck/trap pain. Symptoms increase with prolonged activity or cervical ROM. Relief with resting. Good relief with intermittent CESI's. Pain is well managed with Duragesic 50 ??g q48 and Norco 7.5/325 i po QID PRN w/out side effects.  States medication does allow him to be more functional able to perform ADLs independently, gardening. 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 Currently denies any distal radiating pain, no progressive numbness tingling or weakness.  ??  Pain Assessment  Location of Pain: Neck  Severity of Pain: 6  Quality of Pain: Aching  Duration of Pain: Persistent  Frequency of Pain: Intermittent  Aggravating Factors: Bending, Stretching, Straightening, Exercise  Relieving Factors: Rest  Result of Injury: No  Work-Related Injury: No  Are there other pain locations you wish to document?: No    Current/Past Treatment:   ?? Physical Therapy: Yes  ?? Injection:   ?? 01/03/15: Lt C7-T1 CESI  12/04/15: Lt C7-T1 CESI---60% improved    ?? Medications: Duragesic 50 ??g q48 and Norco 7.5/325 i po QID PRN   ?? Surgery/Consult: Shoulder, Dr Paulina FusiHess    Function-Does the pain medication improve your ability to do:   ?? Personal care: Yes  ?? Housework: Yes   ?? Physical activity: Yes  ?? Social activity: Yes    Pain Scale: 1-10  With Meds:   3/10  Pain Scale: 1-10 Without Meds: 10/10    Potential aberrant drug-related behavior:  ?? Aberrant behavior identified? NO  ?? Potential aberrant behavior identified? NO  ?? Reports loss are stolen prescriptions? NO  ?? Insist on certain medications by name? NO  ?? Purposeful oversedation? NO  ?? Increased dose without authorization? NO    Side Effects: Denies    Past Medical History: Medical history form was reviewd today & scanned into  the Media tab  Past Medical History:   Diagnosis Date   ??? Cancer (HCC)     PROSTATE CANCER   ??? Diabetes mellitus (HCC)    ??? Pancreatitis 10/15/11        REVIEW OF SYSTEMS:   CONSTITUTIONAL: Denies unexplained weight loss, fevers, chills or fatigue  NEUROLOGIC: Denies tremors or seizures         PHYSICAL EXAM:    Vitals: Blood pressure 129/61, pulse 82, height 5' 10.08" (1.78 m), weight 179 lb 14.3 oz (81.6 kg).    GENERAL EXAM:  ?? General Apparence: Patient is adequately groomed with no evidence of malnutrition.  ?? Orientation: The patient is oriented to time, place and person.   ?? Mood & Affect:The patient's mood and affect are appropriate  ?? Vascular: Examination reveals no swelling tenderness in upper or lower extremities.   ?? Lymphatic: The lymphatic examination bilaterally reveals all areas to be without enlargement or induration  ?? Sensation: Sensation is intact without deficit  ?? Coordination/Balance: Good coordination     CERVICAL EXAMINATION:  ?? Inspection: Local inspection shows no step-off or bruising.  Cervical alignment is normal.     ?? Palpation: No evidence of tenderness at the midline, and trapezius.  Paraspinal tenderness is present. There is no step-off or paraspinal spasm.   ??  Range of Motion: Moderate loss of flexion moderate to severely limited extension moderate loss of lateral rotation  ?? Strength: 5/5 bilateral upper extremities   ?? Special Tests:    ??   Spurling's, L'Hermitte's & Hoffman's negative bilaterally.   ??  Cubital and Carpal tunnel Tinel's negative bilaterally.      ?? Skin:There are no rashes, ulcerations or lesions in right & left upper extremities.  ?? Reflexes: Bilaterally triceps, biceps and brachioradialis are 1+.  Clonus absent bilaterally at the feet.   ?? Additional Examinations:       ?? RIGHT UPPER EXTREMITY:  Inspection/examination of the right upper extremity does not show any tenderness, deformity or injury. Range of motion is full. There is no gross instability.  There  are no rashes, ulcerations or lesions. Strength and tone are normal.  ?? LEFT UPPER EXTREMITY: Inspection/examination of the left upper extremity does not show any tenderness, deformity or injury. Range of motion is full. There is no gross instability.  There are no rashes, ulcerations or lesions. Strength and tone are normal.    Diagnostic Testing:   Updated cervical MRI scan report reviewed 12/19/2014 showing multilevel DDD/spondylosis most notably C6 7 moderate central stenosis C3 4, C4 5  ????  UDS June 2016 positive for OxyIR as expected  ????  ????  ????  Impression:   1) Subacute/chronic left neck/trap pain, multilevel spondylosis  2) chronic shoulder pain, s/p sx last w/Dr. Paulina Fusi    3) H/o prostate cancer   4) Opioid maintenance, low risk per ORT   ??    Plan:   1) Duragesic 50 mcg patch apply 1 q48hrs #15 x3 scripts  2) Norco 7.5/325 i po QID #120 x3 scripts  3) F/u 28mo, UDS at f/u    Despite higher opioid dosages he does note significant improvement of his pain and functionality (see above) without any side effects. Patient has been well managed chronically    The risks and use of maintenance opiate medication were reviewed.  These include the risk of tolerance, addition, and abuse.  Potential side effects were also discussed.   Medications are to be taken as prescribed and not escalated without prior agreement.  OARRS/KASPER reviewed & appropriate.   Opiate medications will only be prescribed through the office of Dr Carmon Ginsberg. Donato Schultz, M. D. unless notified otherwise             Dictated by Marene Lenz, PA-C, also seen & evaluated by Dr. Donato Schultz

## 2016-07-24 ENCOUNTER — Encounter: Attending: Orthopaedic Surgery | Primary: Internal Medicine

## 2016-07-25 ENCOUNTER — Encounter

## 2016-07-25 NOTE — Telephone Encounter (Signed)
Patient called requesting to schedule a CESI, he states he is having increased neck pain, difficulty with turning head, pain in the left shoulder but states no c/o radiating pain. Per Dr. Eduardo Osier, recommend repeating MRI, previous MRI 11/2014, and can directly schedule patient for CESI. Per patients request, order sent to [facility]. [place]; patient was advised to please with MRI date so we are able to schedule injection.---DMD

## 2016-07-31 ENCOUNTER — Ambulatory Visit: Admit: 2016-07-31 | Payer: MEDICARE | Primary: Internal Medicine

## 2016-07-31 ENCOUNTER — Ambulatory Visit
Admit: 2016-07-31 | Discharge: 2016-07-31 | Payer: MEDICARE | Attending: Orthopaedic Surgery | Primary: Internal Medicine

## 2016-07-31 DIAGNOSIS — M5442 Lumbago with sciatica, left side: Secondary | ICD-10-CM

## 2016-07-31 MED ORDER — PREDNISONE 10 MG PO TABS
10 MG | ORAL_TABLET | ORAL | 0 refills | Status: DC
Start: 2016-07-31 — End: 2016-08-16

## 2016-07-31 NOTE — Addendum Note (Signed)
Addended by: Wende Mott on: 07/31/2016 03:15 PM     Modules accepted: Orders

## 2016-07-31 NOTE — Progress Notes (Signed)
History of Present Illness:  Jordan Gutierrez is a 82 y.o. male recheck evaluation acute lumbar pain and leg radiculopathy    Location lumbar spine and left leg  Severity severe  Duration several weeks  Associated sign/symptoms pain down the left leg pain in the left gluteal and SI joint and lumbar spine           Medical History  Patient's medications, allergies, past medical, surgical, social and family histories were reviewed and updated as appropriate.    Past Medical History:   Diagnosis Date   ??? Cancer (HCC)     PROSTATE CANCER   ??? Diabetes mellitus (HCC)    ??? Pancreatitis 10/15/11     Family History   Problem Relation Age of Onset   ??? Cancer Mother    ??? Cancer Father      Social History     Social History   ??? Marital status: Married     Spouse name: N/A   ??? Number of children: N/A   ??? Years of education: N/A     Social History Main Topics   ??? Smoking status: Never Smoker   ??? Smokeless tobacco: None   ??? Alcohol use No   ??? Drug use: No   ??? Sexual activity: Not Asked     Other Topics Concern   ??? None     Social History Narrative     Current Outpatient Prescriptions   Medication Sig Dispense Refill   ??? cyanocobalamin 1000 MCG/ML injection      ??? escitalopram (LEXAPRO) 20 MG tablet      ??? NEXIUM 40 MG delayed release capsule      ??? lisinopril (PRINIVIL;ZESTRIL) 5 MG tablet      ??? TOPROL XL 25 MG extended release tablet      ??? clopidogrel (PLAVIX) 75 MG tablet      ??? simvastatin (ZOCOR) 40 MG tablet      ??? tamsulosin (FLOMAX) 0.4 MG capsule      ??? HYDROcodone-acetaminophen (NORCO) 7.5-325 MG per tablet Take 1 tablet by mouth 4 times daily  Dx: M50.30. Earliest Fill Date: 06/15/16 120 tablet 0   ??? HYDROcodone-acetaminophen (NORCO) 7.5-325 MG per tablet Take 1 tablet by mouth 4 times daily  Dx: M50.30. Earliest Fill Date: 07/13/16 120 tablet 0   ??? [START ON 08/11/2016] HYDROcodone-acetaminophen (NORCO) 7.5-325 MG per tablet Take 1 tablet by mouth 4 times daily  Dx: M50.30. Earliest Fill Date: 08/11/16 120 tablet 0   ???  fentaNYL (DURAGESIC) 50 MCG/HR Place 1 patch onto the skin every 48 hours  Dx: M50.30. Earliest Fill Date: 07/13/16 15 patch 0   ??? [START ON 08/11/2016] fentaNYL (DURAGESIC) 50 MCG/HR Place 1 patch onto the skin every 48 hours  Dx: M50.30. Earliest Fill Date: 08/11/16 15 patch 0   ??? HYDROcodone-acetaminophen (NORCO) 7.5-325 MG per tablet Take 1 po q6 hours prn. Earliest Fill Date: 04/18/16 120 tablet 0   ??? HYDROcodone-acetaminophen (NORCO) 7.5-325 MG per tablet Take 1 po q6 hours prn. Earliest Fill Date: 05/16/16 120 tablet 0   ??? fentaNYL (DURAGESIC) 50 MCG/HR i q48 hours. Earliest Fill Date: 04/18/16 15 patch 0   ??? fentaNYL (DURAGESIC) 50 MCG/HR i q48 hours. Earliest Fill Date: 05/16/16 15 patch 0   ??? HYDROcodone-acetaminophen (NORCO) 7.5-325 MG per tablet Take 1 po q6 hours prn pain  To fill on or after 03/09/16. 120 tablet 0   ??? fentaNYL (DURAGESIC) 50 MCG/HR i q48 hours   To  fill on or after 01/09/16. 15 patch 0   ??? aspirin 81 MG tablet Take 81 mg by mouth daily     ??? HYDROcodone-acetaminophen (NORCO) 7.5-325 MG per tablet Take 1 tablet by mouth 4 times daily May fill 12/16/15 120 tablet 0   ??? HYDROcodone-acetaminophen (NORCO) 7.5-325 MG per tablet Take 1 tablet by mouth 4 times daily May fill 01/14/16 120 tablet 0   ??? fentaNYL (DURAGESIC) 50 MCG/HR i q48 hours    May fill 01/06/16 15 patch 0   ??? HYDROcodone-acetaminophen (NORCO) 7.5-325 MG per tablet Take 1 tablet by mouth 4 times daily May fill 10/18/15 120 tablet 0   ??? pregabalin (LYRICA) 50 MG capsule Take 50 mg by mouth 2 times daily.     ??? finasteride (PROSCAR) 5 MG tablet Take 5 mg by mouth daily.     ??? METFORMIN HCL PO Take  by mouth. TAKE 2 TABLETS TWICE A DAY      ??? escitalopram (LEXAPRO) 10 MG tablet Take 10 mg by mouth daily.       ??? ZOLPIDEM TARTRATE PO Take  by mouth nightly.         No current facility-administered medications for this visit.      No Known Allergies    REVIEW OF SYSTEMS:   Pertinent items are noted in HPI  Review of systems reviewed from  Patient History Form dated on 07/31/16 and available in the patient's chart under the Media tab.                                            Examination:    General Exam:    Vitals: Height 5\' 10"  (1.778 m), weight 179 lb (81.2 kg).  Constitutional: Patient is adequately groomed with no evidence of malnutrition  Mental Status: The patient is oriented to time, place and person.  The patient's mood and affect are appropriate.  Lymphatic: The lymphatic examination bilaterally reveals all areas to be without enlargement or induration.  Vascular: Examination reveals no swelling or calf tenderness.  Peripheral pulses are palpable and 2+.  Neurological: The patient has good coordination.  There is no weakness or sensory deficit.    Skin:    Head/Neck: inspection reveals no rashes, ulcerations or lesions.  Trunk:  inspection reveals no rashes, ulcerations or lesions.  Right Lower Extremity: inspection reveals no rashes, ulcerations or lesions.  Left Lower Extremity: inspection reveals no rashes, ulcerations or lesions.      Lumbar/Sacral Spine Examination  Inspection:  No obvious swelling    Palpation:  Severe pain in the left side of his lumbar spine and gluteal area all the way down his left leg    Rang of Motion:  Decreased range of motion lumbar spine secondary to severe pain    Strength:  Quadricep, hamstring, EHL, hip flexor, internal and external rotation of the hip against resistance, all 5 over 5 equal bilaterally    Special Tests:  Pedal pulses are +2 over 4 capillary refill is brisk sensation is intact straight leg raise is negative on the left and the right negative Homans negative Moses hip range of motion is without discomfort lumbar spine range of motion reproduces his symptomatology    Skin: There are no rashes, ulcerations or lesions.    Gait: Antalgic  +2/4 and equal bilaterally for patella and Achilles  Additional Comments:     Additional Examinations:  Right Lower Extremity: Examination of the right  lower extremity does not show any tenderness, deformity or injury.  Range of motion is unremarkable.  There is no gross instability.  There are no rashes, ulcerations or lesions.  Strength and tone are normal.  Thoracic Spine: Examination of the thoracic spine does not show any tenderness, deformity or injury.  Range of motion is unremarkable.  There is no gross instability.  There are no rashes, ulcerations or lesions.  Strength and tone are normal.      Diagnostic Testing:    Views AP lateral taken in the office today  Body Part lumbar spine Impression severe lumbar osteoarthritis          Assessment:  Lumbar osteoarthritis with stenosis    Impression: Lumbar osteoarthritis with stenosis    Office Procedures:  Orders Placed This Encounter   Procedures   ??? Lumbar Spine 2V or 3V     72100     Order Specific Question:   Reason for exam:     Answer:   Xray Exam       Treatment Plan:  Physical therapy, prednisone, MRI, I think MRI will help [place] where to put an epidural injection      Macky Lower. Besnik Febus, D.O.  [facility]  Occupational hygienist  [employer] Team Physician

## 2016-07-31 NOTE — Addendum Note (Signed)
Addended byDonnie Mesa on: 07/31/2016 03:14 PM     Modules accepted: Orders

## 2016-08-07 ENCOUNTER — Ambulatory Visit
Admit: 2016-08-07 | Discharge: 2016-08-07 | Payer: MEDICARE | Attending: Orthopaedic Surgery | Primary: Internal Medicine

## 2016-08-07 DIAGNOSIS — M5442 Lumbago with sciatica, left side: Secondary | ICD-10-CM

## 2016-08-07 NOTE — Progress Notes (Signed)
History of Present Illness:  Jordan Gutierrez is a 82 y.o. male recheck evaluation lumbar spine doing significantly better with the prednisone in physical therapy    Location lumbar spine  Severity moderate severe  Duration several weeks  Associated sign/symptoms pain with activity pain with flexion leg pain bilaterally    I have reviewed and discussed the below Pain assessment findings with the patient.       Medical History  Patient's medications, allergies, past medical, surgical, social and family histories were reviewed and updated as appropriate.    Past Medical History:   Diagnosis Date   ??? Cancer (HCC)     PROSTATE CANCER   ??? Diabetes mellitus (HCC)    ??? Pancreatitis 10/15/11     Family History   Problem Relation Age of Onset   ??? Cancer Mother    ??? Cancer Father      Social History     Social History   ??? Marital status: Married     Spouse name: N/A   ??? Number of children: N/A   ??? Years of education: N/A     Social History Main Topics   ??? Smoking status: Never Smoker   ??? Smokeless tobacco: None   ??? Alcohol use No   ??? Drug use: No   ??? Sexual activity: Not Asked     Other Topics Concern   ??? None     Social History Narrative     Current Outpatient Prescriptions   Medication Sig Dispense Refill   ??? cyanocobalamin 1000 MCG/ML injection      ??? escitalopram (LEXAPRO) 20 MG tablet      ??? NEXIUM 40 MG delayed release capsule      ??? lisinopril (PRINIVIL;ZESTRIL) 5 MG tablet      ??? TOPROL XL 25 MG extended release tablet      ??? simvastatin (ZOCOR) 40 MG tablet      ??? tamsulosin (FLOMAX) 0.4 MG capsule      ??? predniSONE (DELTASONE) 10 MG tablet Days1-2:50mg  PO QD,Days3-4:40mg  PO QD,Days5-6:30mg  PO QD,Days7-8:20mg  PO QD,Days 9-10:10mg  PO QD 30 tablet 0   ??? HYDROcodone-acetaminophen (NORCO) 7.5-325 MG per tablet Take 1 tablet by mouth 4 times daily  Dx: M50.30. Earliest Fill Date: 06/15/16 120 tablet 0   ??? HYDROcodone-acetaminophen (NORCO) 7.5-325 MG per tablet Take 1 tablet by mouth 4 times daily  Dx: M50.30. Earliest Fill  Date: 07/13/16 120 tablet 0   ??? [START ON 08/11/2016] HYDROcodone-acetaminophen (NORCO) 7.5-325 MG per tablet Take 1 tablet by mouth 4 times daily  Dx: M50.30. Earliest Fill Date: 08/11/16 120 tablet 0   ??? fentaNYL (DURAGESIC) 50 MCG/HR Place 1 patch onto the skin every 48 hours  Dx: M50.30. Earliest Fill Date: 07/13/16 15 patch 0   ??? [START ON 08/11/2016] fentaNYL (DURAGESIC) 50 MCG/HR Place 1 patch onto the skin every 48 hours  Dx: M50.30. Earliest Fill Date: 08/11/16 15 patch 0   ??? HYDROcodone-acetaminophen (NORCO) 7.5-325 MG per tablet Take 1 po q6 hours prn. Earliest Fill Date: 04/18/16 120 tablet 0   ??? HYDROcodone-acetaminophen (NORCO) 7.5-325 MG per tablet Take 1 po q6 hours prn. Earliest Fill Date: 05/16/16 120 tablet 0   ??? fentaNYL (DURAGESIC) 50 MCG/HR i q48 hours. Earliest Fill Date: 04/18/16 15 patch 0   ??? fentaNYL (DURAGESIC) 50 MCG/HR i q48 hours. Earliest Fill Date: 05/16/16 15 patch 0   ??? HYDROcodone-acetaminophen (NORCO) 7.5-325 MG per tablet Take 1 po q6 hours prn pain  To fill  on or after 03/09/16. 120 tablet 0   ??? fentaNYL (DURAGESIC) 50 MCG/HR i q48 hours   To fill on or after 01/09/16. 15 patch 0   ??? aspirin 81 MG tablet Take 81 mg by mouth daily     ??? HYDROcodone-acetaminophen (NORCO) 7.5-325 MG per tablet Take 1 tablet by mouth 4 times daily May fill 12/16/15 120 tablet 0   ??? HYDROcodone-acetaminophen (NORCO) 7.5-325 MG per tablet Take 1 tablet by mouth 4 times daily May fill 01/14/16 120 tablet 0   ??? fentaNYL (DURAGESIC) 50 MCG/HR i q48 hours    May fill 01/06/16 15 patch 0   ??? HYDROcodone-acetaminophen (NORCO) 7.5-325 MG per tablet Take 1 tablet by mouth 4 times daily May fill 10/18/15 120 tablet 0   ??? pregabalin (LYRICA) 50 MG capsule Take 50 mg by mouth 2 times daily.     ??? finasteride (PROSCAR) 5 MG tablet Take 5 mg by mouth daily.     ??? METFORMIN HCL PO Take  by mouth. TAKE 2 TABLETS TWICE A DAY      ??? escitalopram (LEXAPRO) 10 MG tablet Take 10 mg by mouth daily.       ??? ZOLPIDEM TARTRATE PO Take  by  mouth nightly.         No current facility-administered medications for this visit.      No Known Allergies    REVIEW OF SYSTEMS:   Pertinent items are noted in HPI  Review of systems reviewed from Patient History Form dated on 08/07/16 and available in the patient's chart under the Media tab.                                            Examination:    General Exam:    Vitals: Height 5\' 10"  (1.778 m), weight 179 lb (81.2 kg).  Constitutional: Patient is adequately groomed with no evidence of malnutrition  Mental Status: The patient is oriented to time, place and person.  The patient's mood and affect are appropriate.        Lumbar/Sacral Spine Examination  Inspection:  No swelling no deformity    Palpation:  No pain to palpation    Rang of Motion:  He has excellent range of motion today with flexion and extension side bending and rotation    Strength:  Quadricep, hamstring, EHL, hip flexor, internal and external rotation of the hip against resistance, all 5 over 5 equal bilaterally    Special Tests:  Pedal pulse are +2 over 4 capillary refill is brisk sensation is intact negative Homans negative Moses negative straight leg raise    Gait: Normal gait  +2/4 and equal bilaterally for patella and Achilles      Additional Comments:     Additional Examinations:  Thoracic Spine: Examination of the thoracic spine does not show any tenderness, deformity or injury.  Range of motion is unremarkable.  There is no gross instability.  There are no rashes, ulcerations or lesions.  Strength and tone are normal.  Neck: Examination of the neck does not show any tenderness, deformity or injury.  Range of motion is unremarkable.  There is no gross instability.  There are no rashes, ulcerations or lesions.  Strength and tone are normal.      MRI demonstrates lumbar stenosis with osteophyte formation and disc protrusion        Assessment:  Lumbar disc disease with osteoarthritis    Impression: Lumbar disc disease with  osteoarthritis    Office Procedures:  No orders of the defined types were placed in this encounter.      Treatment Plan:  Lumbar stenosis         Wende Mott, DO

## 2016-08-07 NOTE — Patient Instructions (Signed)
WEAR LOOSE COMFORTABLE CLOTHING AND BRING A PICTURE ID AND INSURANCE CARDS.

## 2016-08-07 NOTE — Progress Notes (Signed)
LEFT MESSAGE FOR PATIENT

## 2016-08-07 NOTE — Telephone Encounter (Signed)
CPT  62321  99152  DX  M54.12  OP SX AUTH NPR FOR THIS PLAN  LEFT  LEVELS  C7 - T1  PROCEDURE  ESI  [facility]  WITH  FCV   -  MEDICARE

## 2016-08-12 ENCOUNTER — Inpatient Hospital Stay: Primary: Internal Medicine

## 2016-08-15 NOTE — H&P (Signed)
HISTORY AND PHYSICAL/PRE-SEDATION ASSESSMENT    Patient:  Jordan Gutierrez, [place]   DOB:  Sep 16, 1929  Medical Record No.:  [id number]   Date:  08-16-16  Physician:  Pierre BaliF Clifford Dorothie Wah, M.D.  [facility]     Nursing History and Physical reviewed and agreed upon.      Additional findings:    Allergies:  Review of patient's allergies indicates no known allergies.    Home Medications:    Prior to Admission medications    Medication Sig Start Date End Date Taking? Authorizing Provider   cyanocobalamin 1000 MCG/ML injection  07/29/16   Historical Provider, MD   escitalopram (LEXAPRO) 20 MG tablet  05/08/16   Historical Provider, MD   NEXIUM 40 MG delayed release capsule  04/29/16   Historical Provider, MD   lisinopril (PRINIVIL;ZESTRIL) 5 MG tablet  05/10/16   Historical Provider, MD   TOPROL XL 25 MG extended release tablet  05/10/16   Historical Provider, MD   simvastatin (ZOCOR) 40 MG tablet  05/10/16   Historical Provider, MD   tamsulosin (FLOMAX) 0.4 MG capsule  05/28/16   Historical Provider, MD   predniSONE (DELTASONE) 10 MG tablet Days1-2:50mg  PO QD,Days3-4:40mg  PO QD,Days5-6:30mg  PO QD,Days7-8:20mg  PO QD,Days 9-10:10mg  PO QD 07/31/16   Michael Litteronald G Hess, DO   HYDROcodone-acetaminophen (NORCO) 7.5-325 MG per tablet Take 1 tablet by mouth 4 times daily  Dx: M50.30. Earliest Fill Date: 06/15/16 06/15/16   Pierre BaliF Clifford Kesler Wickham, MD   HYDROcodone-acetaminophen (NORCO) 7.5-325 MG per tablet Take 1 tablet by mouth 4 times daily  Dx: M50.30. Earliest Fill Date: 07/13/16 07/13/16   Pierre BaliF Clifford Jorie Zee, MD   HYDROcodone-acetaminophen (NORCO) 7.5-325 MG per tablet Take 1 tablet by mouth 4 times daily  Dx: M50.30. Earliest Fill Date: 08/11/16 08/11/16   Pierre BaliF Clifford Keera Altidor, MD   fentaNYL (DURAGESIC) 50 MCG/HR Place 1 patch onto the skin every 48 hours  Dx: M50.30. Earliest Fill Date: 08/11/16 08/11/16 09/10/16  Pierre BaliF Clifford Tagg Eustice, MD   HYDROcodone-acetaminophen [facility](NORCO) 7.5-325 MG per tablet Take 1 po q6 hours prn. Earliest  Fill Date: 04/18/16 04/18/16   Pierre BaliF Clifford Kelso Bibby, MD   HYDROcodone-acetaminophen (NORCO) 7.5-325 MG per tablet Take 1 po q6 hours prn. Earliest Fill Date: 05/16/16 05/16/16   Pierre BaliF Clifford Asa Fath, MD   fentaNYL (DURAGESIC) 50 MCG/HR i q48 hours. Earliest Fill Date: 04/18/16 04/18/16   Pierre BaliF Clifford Carrina Schoenberger, MD   fentaNYL (DURAGESIC) 50 MCG/HR i q48 hours. Earliest Fill Date: 05/16/16 05/16/16   Pierre BaliF Clifford Lovenia Debruler, MD   HYDROcodone-acetaminophen [facility](NORCO) 7.5-325 MG per tablet Take 1 po q6 hours prn pain  To fill on or after 03/09/16. 01/11/16   Wonda CeriseMegan Marie Stanfield, PA-C   fentaNYL (DURAGESIC) 50 MCG/HR i q48 hours   To fill on or after 01/09/16. 01/11/16   Wonda CeriseMegan Marie Stanfield, PA-C   aspirin 81 MG tablet Take 81 mg by mouth daily    Historical Provider, MD   HYDROcodone-acetaminophen [facility](NORCO) 7.5-325 MG per tablet Take 1 tablet by mouth 4 times daily May fill 12/16/15 11/02/15   Megan Roxana HiresMarie Stanfield, PA-C   HYDROcodone-acetaminophen [facility](NORCO) 7.5-325 MG per tablet Take 1 tablet by mouth 4 times daily May fill 01/14/16 11/02/15   Megan Roxana HiresMarie Stanfield, PA-C   fentaNYL (DURAGESIC) 50 MCG/HR i q48 hours    May fill 01/06/16 11/02/15   Megan Roxana HiresMarie Stanfield, PA-C   HYDROcodone-acetaminophen [facility](NORCO) 7.5-325 MG per tablet Take 1 tablet by mouth 4 times daily May fill 10/18/15 09/14/15  Pierre BaliF Clifford Jaylyne Breese, MD   pregabalin (LYRICA) 50 MG capsule Take 50 mg by mouth 2 times daily.    Historical Provider, MD   finasteride (PROSCAR) 5 MG tablet Take 5 mg by mouth daily.    Historical Provider, MD   METFORMIN HCL PO Take  by mouth. TAKE 2 TABLETS TWICE A DAY     Historical Provider, MD   escitalopram (LEXAPRO) 10 MG tablet Take 10 mg by mouth daily.      Historical Provider, MD   ZOLPIDEM TARTRATE PO Take  by mouth nightly.      Historical Provider, MD       Vitals: Stable       PHYSICAL EXAM:  HENT: Airway patent and reviewed  Cardiovascular: Normal rate, regular rhythm, normal heart sounds.   Pulmonary/Chest: No wheezes. No rhonchi. No rales.    Abdominal: Soft. Bowel sounds are normal. No distension.    ASA CLASS:         []    I. Normal, healthy adult           [x]    II.  Mild systemic disease            []    III.  Severe systemic disease      Sedation plan:   [x]   Local              [x]   Minimal                  []   General anesthesia    Patient's condition acceptable for planned procedure/sedation.   Post Procedure Plan   Return to same level of care   ______________________     The risks and benefits as well as alternatives to the procedure have been discussed with the patient and or family.  The patient and or next of kin understands and agrees to proceed.    Pierre BaliF Clifford Cainen Burnham, M.D.

## 2016-08-16 ENCOUNTER — Inpatient Hospital Stay: Admit: 2016-08-16 | Attending: Physical Medicine & Rehabilitation | Primary: Internal Medicine

## 2016-08-16 LAB — POCT GLUCOSE: POC Glucose: 198 mg/dl — ABNORMAL HIGH (ref 70–99)

## 2016-08-16 MED ORDER — LIDOCAINE HCL 1 % (PF) IJ SOLN 2 ML
1 % (PF) | Freq: Once | INTRAMUSCULAR | Status: AC
Start: 2016-08-16 — End: 2016-08-16
  Administered 2016-08-16: 13:00:00 0.1 mL via INTRADERMAL

## 2016-08-16 MED ORDER — LACTATED RINGERS IV SOLN
Freq: Once | INTRAVENOUS | Status: AC
Start: 2016-08-16 — End: 2016-08-16
  Administered 2016-08-16: 13:00:00 via INTRAVENOUS

## 2016-08-16 MED ORDER — MIDAZOLAM HCL 2 MG/2ML IJ SOLN
2 MG/ML | INTRAMUSCULAR | Status: AC
Start: 2016-08-16 — End: 2016-08-16

## 2016-08-16 MED ORDER — SODIUM CHLORIDE 0.9 % IJ SOLN
0.9 % | INTRAMUSCULAR | Status: AC
Start: 2016-08-16 — End: ?

## 2016-08-16 MED ORDER — IOHEXOL 240 MG/ML IJ SOLN
240 | INTRAMUSCULAR | Status: AC
Start: 2016-08-16 — End: 2016-08-16

## 2016-08-16 MED ORDER — DEXAMETHASONE SOD PHOSPHATE PF 10 MG/ML IJ SOLN
10 MG/ML | INTRAMUSCULAR | Status: AC
Start: 2016-08-16 — End: ?

## 2016-08-16 MED FILL — LIDOCAINE HCL (PF) 1 % IJ SOLN: 1 % | INTRAMUSCULAR | Qty: 2

## 2016-08-16 MED FILL — LACTATED RINGERS IV SOLN: INTRAVENOUS | Qty: 1000

## 2016-08-16 MED FILL — DEXAMETHASONE SOD PHOSPHATE PF 10 MG/ML IJ SOLN: 10 MG/ML | INTRAMUSCULAR | Qty: 1

## 2016-08-16 MED FILL — SODIUM CHLORIDE 0.9 % IJ SOLN: 0.9 % | INTRAMUSCULAR | Qty: 10

## 2016-08-16 MED FILL — MIDAZOLAM HCL 2 MG/2ML IJ SOLN: 2 MG/ML | INTRAMUSCULAR | Qty: 2

## 2016-08-16 MED FILL — OMNIPAQUE 240 MG/ML IJ SOLN: 240 MG/ML | INTRAMUSCULAR | Qty: 50

## 2016-08-16 NOTE — Op Note (Signed)
Patient:  Jordan Gutierrez, Jordan Gutierrez   Medical Record #:  [id number]   Date:  08-16-16  Physician:  Pierre BaliF Clifford Marcianne Ozbun, M.D.  [facility]     Pre-op diagnosis:  Cervical radiculitis, cervical spondylosis  Post-op diagnosis:  same  Procedure: Left C7/T1 cervical interlaminar epidural injection #1 with flouroscopic guidance  Anesthesia: Conscious sedation with 1mg  Versed  Procedure Note:    The patient was admitted through pre-op and written consent was obtained.  The patient was advised of the risks and benefits of the procedure, including but not limited to the following: bleeding, pain, infection, temporary paralysis, nerve damage and spinal headache.  The patient was given the opportunity to ask questions.  There were no contraindications for this procedure.    The appropriate area was prepped and draped in a sterile fashion.  Landmarks were identified and marked.  The skin and soft tissues were anesthetized with 1% lidocaine.  A 22G 3.5inch Touhy needle was advanced to the left C7/T1 interlaminar space using fluoroscopic guidance confirmed by multiple views showing appropriate needle placement.  Injection of contrast showed epidural flow.  There were no signs of intravascular or intrathecal injection.    10 mg dexamethasone and 2 mL normal saline solution were then injected.     There were no complications and the patient tolerated the procedure well.  The patient was transferred to the recovery area and monitored.  Discharge instructions were given.  The patient is to contact me for any post-procedure concerns.  The patient is to follow up as scheduled.    LOR: 2.75cm    Casidee Jann Donato Schultzlifford Marcin Holte, MD

## 2016-08-16 NOTE — Discharge Instructions (Signed)
[facility]          [phone]  POST EPIDURAL STEROID INJECTION    PATIENT INSTRUCTIONS:  1)  DIET   .  RESUME NORMAL DIET   .  RESUME ALL PRIOR MEDICATIONS  2)  ACTIVITY  .   DO NOT STAY ALONE FOR 4-6 HOURS AFTER THE PROCEDURE  .  REST TODAY  .  DO NOT DRIVE TODAY AND 24 HOURS IF YOU RECEIVED SEDATION.  IF YOU ARE   SEEN DRIVING DURING THIS TIME THE PROPER AUTHORITIES WILL BE NOTIFIED.  .  DO NOT SIGN ANY LEGAL DOCUMENTS, MAKE ANY MAJOR DECISIONS, OR BE INVOLVED IN WORK DECISIONS FOR THE REMAINDER OF THE DAY.   .  RESUME NORMAL ACTIVITIES.  DO NOT OVER EXERT YOURSELF.  .  SHOWER OR BATHE AS NORMAL  3)  SITE CARE   .  MAY USE ICE TO SITE IF NEEDED              .  KEEP SITE DRY FOR 12 HOURS.  IF A BAND-AID WAS APPLIED TO THE                 INJECTION  SITE REMOVE BAND-AID THE FOLLOWING DAY  .  OBSERVE PUNCTURE SITE FOR SIGNS OF INFECTION (REDNESS, WARMTH, SWELLING, PURULENT DRAINAGE, INCREASED TENDERNESS OR FEVER GREATER THAN 101)  .  REPORT SIGNS OF INFECTION TO THE PHYSICIAN  4)  EXPECTED SIDE EFFECTS   .  FLUID RETENTION   .  NERVOUS ENERGY   .  SLEEPLESSNESS   .  MUSCLE SPASMS  . TEMPORARY WEAKNESS/TINGLING/NUMBNESS IN THE EXTREMITIES   . PAIN AT INJECTION SITE   .  DROWSINESS  .  POSSIBLE ELEVATION OF BLOOD SUGARS IF YOU ARE DIABETIC  5)  TO REACH DR. VALENTIN  .  CALL IF SYMPTOMS WORSEN SEVERELY OR FOR A SEVERE HEADACHE THAT WORSENS WHEN UPRIGHT     .  CALL IF YOU DEVELOP A FEVER GREATER THAN 101  .  CALL [phone] TO MAKE A FOLLOW UP APPOINTMENT WITH DR. VALENTIN   .  CALL REFERRING DOCTOR IF ANY PROBLEMS   6)  ADDITIONAL INSTRUCTIONS  .  IT MAY TAKE 24 TO 36 HOURS TO FEEL IMPROVEMENT

## 2016-08-16 NOTE — Progress Notes (Signed)
NURSING CARE PLANS FOLLOWED     ?? Potential for anxiety-decrease anxiety-allow patient to verbalize  ?? Potential for infection-no infection-proper infection controls  ?? Potential for fall the patient will move to fall risk after procedure- through the recovery  phase   ?? Orient to environment  ?? Call light in reach  ?? Instruct to call for assistance prior to getting up  ?? Non skid footwear on   ?? Bed wheels locked and bed in lowest position - siderails up times two  ?? Potential for deep sedation-no deep sedation-know maximum allowable dose         adverse reactions and nursing considerations. Assess VS and LOC

## 2016-08-16 NOTE — Progress Notes (Signed)
Carey Jelus RT    [employer]  B. Emmonds, RN    Lidocaine 1% plain 2ml  Omnipaque 240mg /ml  2ml  Dexamethasone 20mg   Normal Saline 0.9% 1cc

## 2016-08-16 NOTE — Op Note (Signed)
POST SEDATION ASSESSMENT      Patient:  Jordan Gutierrez, Jordan Gutierrez   DOB:  July 19, 1929  Medical Record No.:  [id number]   Date:  08/16/2016  Physician:  Pierre BaliF Clifford Alverna Fawley, M.D.      Patient location: Recovery  Level of consciousness: Awake, Alert, Oriented  Pain Control: Good  Respiration: Adequate  Post-op assessment: No sedation complications    Last Vitals:   Vitals:    08/16/16 1018   BP: (!) 148/82   Pulse: 79   Resp: 16   Temp:    SpO2: 95%     Post-op Vitals: Stable    Jerame Hedding Clifford Fareeha Evon  10:57 AM

## 2016-08-16 NOTE — Progress Notes (Signed)
Site of injection clean dry and intact. Instructions reviewed with pt. Verbalized understanding. To be discharged to home with family.

## 2016-08-23 ENCOUNTER — Ambulatory Visit
Admit: 2016-08-23 | Discharge: 2016-08-23 | Payer: MEDICARE | Attending: Physical Medicine & Rehabilitation | Primary: Internal Medicine

## 2016-08-23 DIAGNOSIS — M48061 Spinal stenosis, lumbar region without neurogenic claudication: Secondary | ICD-10-CM

## 2016-08-23 NOTE — Telephone Encounter (Signed)
 CPT: 64483, 50 Modifier, 770003 26, 99152 DX: M48.06, M47.816, M51.36   Outpatient  SX Location: [facility] Procedure: ESI  Auth: NPR  Date:   Insurance: Medicare A&B  Physician: AAS

## 2016-08-23 NOTE — Telephone Encounter (Signed)
CPT: 64483, 77003 26, 99152 DX: M48.06, M47.816, M51.36   Outpatient   SX Location: [facility] Procedure: ESI  Auth: NPR  Date: 09-11-16  Insurance: Medicare A&B  Physician: AAS

## 2016-08-23 NOTE — Progress Notes (Signed)
Follow up: SPINE      CHIEF COMPLAINT:    Chief Complaint   Patient presents with   ??? Lower Back Pain     pain x6 months, no injury, worsens when standing, has some relief when he is able to lay flat   ??? Leg Pain     left, down to the knee       HISTORY OF PRESENT ILLNESS:        The patient is a 82 y.o. male whom reports he presents with low back pain for the past 6 months.  He cannot identify any injury or any inciting event.  The pain does radiate into the left lower extremity.  This limits his ability to stand and walk.  He is only able to stand and walk for about 5-10 minutes.  He saw Dr. Paulina FusiHess recently for his back pain and underwent lumbar spine films on 07/31/2016 and an MRI lumbar spine on 08/02/2016.  He denies having any weakness in lower extremities.  He denies having any bowel bladder dysfunction.  He is interested in proceeding with epidural steroid injections.  He has tried oral steroids with good relief but only short-term      Pain Assessment  Location of Pain: Back  Location Modifiers: Posterior  Severity of Pain: 8  Quality of Pain: Aching  Duration of Pain: Persistent  Frequency of Pain: Constant  Aggravating Factors: Walking, Standing  Limiting Behavior: Yes  Relieving Factors: Rest (laying flat)  Result of Injury: No  Work-Related Injury: No  Are there other pain locations you wish to document?: No    Associated signs and symptoms:   Neurogenic bowel or bladder symptoms:  no   Perceived weakness:  no   Difficulty walking:  yes              Past Medical History:   Past Medical History:   Diagnosis Date   ??? Cancer (HCC)     PROSTATE CANCER   ??? Diabetes mellitus (HCC)    ??? Pancreatitis 10/15/11      Past Surgical History:     Past Surgical History:   Procedure Laterality Date   ??? CARDIAC SURGERY     ??? CORONARY ANGIOPLASTY WITH STENT PLACEMENT     ??? MANDIBLE FRACTURE SURGERY  10/15/11   ??? PATELLA FRACTURE SURGERY  10/15/11     Current Medications:     Current Outpatient Prescriptions:   ???   cyanocobalamin 1000 MCG/ML injection, , Disp: , Rfl:   ???  escitalopram (LEXAPRO) 20 MG tablet, , Disp: , Rfl:   ???  NEXIUM 40 MG delayed release capsule, every morning (before breakfast) , Disp: , Rfl:   ???  lisinopril (PRINIVIL;ZESTRIL) 5 MG tablet, daily , Disp: , Rfl:   ???  TOPROL XL 25 MG extended release tablet, daily , Disp: , Rfl:   ???  tamsulosin (FLOMAX) 0.4 MG capsule, daily , Disp: , Rfl:   ???  fentaNYL (DURAGESIC) 50 MCG/HR, Place 1 patch onto the skin every 48 hours  Dx: M50.30. Earliest Fill Date: 08/11/16, Disp: 15 patch, Rfl: 0  ???  HYDROcodone-acetaminophen (NORCO) 7.5-325 MG per tablet, Take 1 po q6 hours prn. Earliest Fill Date: 05/16/16, Disp: 120 tablet, Rfl: 0  ???  aspirin 81 MG tablet, Take 81 mg by mouth daily, Disp: , Rfl:   ???  pregabalin (LYRICA) 50 MG capsule, Take 50 mg by mouth 2 times daily., Disp: , Rfl:   ???  finasteride (PROSCAR)  5 MG tablet, Take 5 mg by mouth daily., Disp: , Rfl:   ???  METFORMIN HCL PO, Take  by mouth. TAKE 2 TABLETS TWICE A DAY , Disp: , Rfl:   ???  escitalopram (LEXAPRO) 10 MG tablet, Take 10 mg by mouth daily.  , Disp: , Rfl:   ???  ZOLPIDEM TARTRATE PO, Take  by mouth nightly.  , Disp: , Rfl:   Allergies:  Review of patient's allergies indicates no known allergies.  Social History:    reports that he has never smoked. He does not have any smokeless tobacco history on file. He reports that he does not drink alcohol or use illicit drugs.  Family History:   Family History   Problem Relation Age of Onset   ??? Cancer Mother    ??? Cancer Father        REVIEW OF SYSTEMS:   CONSTITUTIONAL: Denies unexplained weight loss, fevers, chills or fatigue  NEUROLOGICAL: Denies unsteady gait or progressive weakness  MUSCULOSKELETAL: Denies joint swelling or redness  GI: Denies nausea, vomiting, diarrhea   GU: Denies bowel or bladder issues       PHYSICAL EXAM:    Vitals: Blood pressure 130/71, height 5\' 10"  (1.778 m), weight 185 lb (83.9 kg).    GENERAL EXAM:  ?? General Apparence: Patient is  adequately groomed with no evidence of malnutrition.  ?? Psychiatric: Orientation: The patient is oriented to time, place and person. The patient's mood and affect are appropriate   ?? Vascular: Examination reveals no swelling and palpation reveals no tenderness in upper or lower extremities. Good capillary refill.   ?? The lymphatic examination of the neck, axillae and groin reveals all areas to be without enlargement or induration  ?? Sensation is intact without deficit in the upper and lower extremities to light touch and pinprick  ?? Coordination of the upper and lower extremities are normal.    LUMBAR/SACRAL EXAMINATION:  ?? Inspection: Local inspection shows no step-off or bruising.  Lumbar alignment is normal. No instability is noted.  ?? Palpation:   No evidence of tenderness at the midline.  No tenderness bilaterally at the paraspinal or trochanters.  There is no paraspinal spasm.  ?? Range of Motion: limited by 25% in all planes due to pain  ?? Strength:   Strength testing is 5/5 in all muscle groups tested.  ?? Special Tests:   Straight leg raise and crossed SLR negative.Patrick's testing is negative bilaterally. FADIR's testing is negative bilaterally.  ?? Skin: There are no rashes, ulcerations or lesions.  ?? Reflexes: Reflexes are symmetrically 2+ at the patellar and ankle tendons.  Clonus absent bilaterally at the feet.  ?? Gait & station: normal, patient ambulates without assistance  ?? Additional Examinations:  ?? RIGHT LOWER EXTREMITY: Inspection/examination of the right lower extremity does not show any tenderness, deformity or injury. Range of motion is normal and pain-free. There is no gross instability.  There are no rashes, ulcerations or lesions. Strength and tone are normal. No atrophy or abnormal movements are noted.  ?? LEFT LOWER EXTREMITY:  Inspection/examination of the left lower extremity does not show any tenderness, deformity or injury. Range of motion is normal and pain-free. There is no gross  instability. There are no rashes, ulcerations or lesions.  Strength and tone are normal. No atrophy or abnormal movements are noted.      Diagnostic Testing:    MR Lumbar spine shows  08/02/16  Severely degenerated desiccated disc with broad-based disc bulge/posterior  osteophytic ridging    with facet arthropathy and ligamentum flavum hypertrophy contributing to a severe central canal   ??stenosis at L2-3. Facet arthropathy, ligamentum flavum    hypertrophy, broad-based disc bulging at L4-5 with moderate to severe central canal narrowing.   ??   Relatively milder central canal narrowing throughout the remainder of the lumbar spine as    described.   ??   Multilevel bilateral neuroforaminal stenoses as described.       Results for orders placed or performed during the hospital encounter of 08/16/16   POCT Glucose   Result Value Ref Range    POC Glucose 198 (H) 70 - 99 mg/dl    Performed on ACCU-CHEK      Impression:       1. Lumbar stenosis    2. Spondylosis of lumbar region without myelopathy or radiculopathy    3. DDD (degenerative disc disease), lumbar        Plan:    1. Medications:  Continue anti-inflammatories with appropriate GI Precautions including to stop if develop dark tarry stools or GI upset and to take with food.  2. PT:  Encouraged to continue with HEP.  3. Further studies:  No further studies.  4. Interventional:  We discussed pursuing a b/l L3 TF epidural steroid injection to address the pain.  Radiologic imaging and symptoms confirm the pain etiology.  Risks, benefits and alternatives of interventional options were discussed.  These include and are not limited to bleeding, infection, spinal headache, nerve injury and lack of pain relief.  The patient verbalized understanding and would like to proceed.  The patient will be scheduled accordingly.  5. Follow up:  4-6 weeks          Kanon Colunga. Jerilynn Birkenhead, MD, MBA, [facility]  Board Certified in Physical Medicine & Rehabilitation  Board Certified and Fellowship  Trained in Pain Medicine  [facility]  Partner of [facility]             This dictation was performed with a verbal recognition program [facility]) and it was checked for errors. It is possible that there are still dictated errors within this office note. If so, please bring any errors to my attention for an addendum. All efforts were made to ensure that this office note is accurate.

## 2016-08-23 NOTE — Telephone Encounter (Signed)
[facility], Dr. Pernell Dupre, patient does not have glaucoma. Ok for injs

## 2016-08-28 ENCOUNTER — Inpatient Hospital Stay: Admit: 2016-08-28 | Attending: Physical Medicine & Rehabilitation | Primary: Internal Medicine

## 2016-08-28 ENCOUNTER — Ambulatory Visit: Admit: 2016-08-28 | Primary: Internal Medicine

## 2016-08-28 LAB — POCT GLUCOSE: POC Glucose: 145 mg/dl — ABNORMAL HIGH (ref 70–99)

## 2016-08-28 MED ORDER — BUPIVACAINE HCL (PF) 0.5 % IJ SOLN
0.5 | INTRAMUSCULAR | Status: AC
Start: 2016-08-28 — End: 2016-08-28

## 2016-08-28 MED ORDER — DEXAMETHASONE SODIUM PHOSPHATE 10 MG/ML IJ SOLN
10 | INTRAMUSCULAR | Status: AC
Start: 2016-08-28 — End: 2016-08-28

## 2016-08-28 MED ORDER — LIDOCAINE HCL (PF) 1 % IJ SOLN
1 | INTRAMUSCULAR | Status: AC
Start: 2016-08-28 — End: 2016-08-28

## 2016-08-28 MED ORDER — MIDAZOLAM HCL 2 MG/2ML IJ SOLN
2 | INTRAMUSCULAR | Status: AC
Start: 2016-08-28 — End: 2016-08-28

## 2016-08-28 MED ORDER — IOPAMIDOL 61 % IJ SOLN
61 % | Freq: Once | INTRAMUSCULAR | Status: AC | PRN
Start: 2016-08-28 — End: 2016-08-28
  Administered 2016-08-28: 13:00:00 2 mL

## 2016-08-28 MED ORDER — METHYLPREDNISOLONE ACETATE 80 MG/ML IJ SUSP
80 | INTRAMUSCULAR | Status: AC
Start: 2016-08-28 — End: 2016-08-28

## 2016-08-28 MED FILL — DEXAMETHASONE SODIUM PHOSPHATE 10 MG/ML IJ SOLN: 10 MG/ML | INTRAMUSCULAR | Qty: 2

## 2016-08-28 MED FILL — BUPIVACAINE HCL (PF) 0.5 % IJ SOLN: 0.5 % | INTRAMUSCULAR | Qty: 30

## 2016-08-28 MED FILL — MIDAZOLAM HCL 2 MG/2ML IJ SOLN: 2 MG/ML | INTRAMUSCULAR | Qty: 2

## 2016-08-28 MED FILL — DEPO-MEDROL 80 MG/ML IJ SUSP: 80 MG/ML | INTRAMUSCULAR | Qty: 1

## 2016-08-28 MED FILL — LIDOCAINE HCL (PF) 1 % IJ SOLN: 1 % | INTRAMUSCULAR | Qty: 30

## 2016-08-28 NOTE — Discharge Instructions (Signed)
Pain Therapy Discharge Instructions - Dr Singla    . Rest today (relative)    . Do not drive today for 8 hours (24 hours if sedated)  Wear your seatbelt home.    . If you received sedation during your procedure, be advised for the next 24 hours:  o Do not drink alcohol  o Do not operate machinery  o Do not make any important decisions or sign any legal documents  o Have a responsible adult available to assist you  o You may experience light headedness, dizziness or sleepiness    . May apply ice to site for 15 minutes 3 times a day for 48 hours..  Avoid heat for 48 hours.    . Follow up as instructed.    . Keep site dry for 12 hours, then remove band-aid    . It may take 24 to 48 hours to feel improvement    . Side effects of steroids include:  o Elevated glucose levels (in diabetics)  o Fluid retention  o Tenderness at site  o Nervous energy  o Sleeplessness  o Muscle spasms  o Facial flushing  o Hot flashes    . Call if:  o Your symptoms worsen severely  o You experience a severe headache that worsens when upright  o You develop a fever greater than 101?F    . Call [phone]  for follow-up appointments, questions or problems  . IF YOU CURRENTLY TAKE A BLOOD THINNERS YOU  MAY RESUME IT 24 HOURS AFTER PROCEDURE  .

## 2016-08-28 NOTE — Progress Notes (Signed)
Pre procedure taching done, verbalizes understanding

## 2016-08-28 NOTE — Progress Notes (Signed)
TRANSFORAMINAL INJECTION  Dr Singla Injection Note    Sight marked/ confirmed with x-ray: yes  Position: Prone  Prepped with: Chloraprep    Local 1 % Lidocaine    Neut 4.2 %   Depomedrol (mg): 80  Marcaine: .5%(ml) 2 ml    Monitor by: M Cay Kath RN  C - Arm operated by: M KLOEPFER RT  Circulator: C SMITH RN  Prepped by: A SINGLA MD

## 2016-08-28 NOTE — Op Note (Signed)
 Patient:  Jordan Gutierrez, Jordan Gutierrez  Date of Birth:  11-15-1929  Medical Record #:  [id number]   [facility] [place]  Date:     Physician:  Kern ReapAarti Tatjana Turcott, MD    Procedure: 1.  Transforaminal Lumbar Epidural Steroid Injection -  right L3           2.   Transforaminal Lumbar Epidural Steroid Injection -  left L3     Pre-Procedure Diagnosis: Lumbar stenosis, Lumbar DDD    Post-Procedure Diagnosis: Same    Sedation: Local with 1% Lidocaine 4 ml and 2 mg of IV Versed    EBL: None    Complications: None    Procedure Summary:    The patient was seen in the office for complaints of low back and bilateral leg pain.  Review of the imaging and physical exam of the patient confirmed the pre-procedure diagnosis.  After a thorough discussion of risks, benefits and alternatives informed consent was obtained.      The patient was brought to the procedure suite and placed in the prone position.  The skin overlying the lumbar spine was prepped and draped in the usual sterile fashion.  Using fluoroscopic guidance, the right L3 foramen was identified.  Through anesthetized skin, a 22 gauge 3.5 inch curved tip spinal needle was advanced into the foramen.  Isovue M 300 was instilled showing an epidurogram/nerve root outline pattern without evidence of vascular or intrathecal spread. Following which, 7.5 mg of dexamethasone mixed with 1 ml of 0.5% Marcaine was instilled. The needle was removed.  Using fluoroscopic guidance, the left L3 foramen was identified.  Through anesthetized skin, a 22 gauge 3.5 inch curved tip spinal needle was advanced into the foramen.  Isovue M 300 was instilled showing an epidurogram/nerve root outline pattern without evidence of vascular or intrathecal spread. Following which, 7.5 mg of dexamethasone mixed with 1 ml of 0.5% Marcaine was instilled. The needle was removed and band-aids were applied.    The patient was transferred to the post-operative area in stable condition.

## 2016-08-28 NOTE — Progress Notes (Signed)
IV discontinued, catheter intact, and dressing applied.    Procedural dressing dry and intact.    Bilateral lower extremities equal in strength.    Discharge instructions reviewed with patient or responsible adult, signed and copy given.  All home medications have been reviewed.  All questions answered and patient or responsible adult verbalized understanding.

## 2016-08-30 DEATH — deceased

## 2016-09-05 ENCOUNTER — Ambulatory Visit
Admit: 2016-09-05 | Discharge: 2016-09-05 | Payer: MEDICARE | Attending: Physical Medicine & Rehabilitation | Primary: Internal Medicine

## 2016-09-05 DIAGNOSIS — M5136 Other intervertebral disc degeneration, lumbar region: Secondary | ICD-10-CM

## 2016-09-05 MED ORDER — FENTANYL 50 MCG/HR TD PT72
50 MCG/HR | MEDICATED_PATCH | TRANSDERMAL | 0 refills | Status: DC
Start: 2016-09-05 — End: 2016-09-10

## 2016-09-05 MED ORDER — HYDROCODONE-ACETAMINOPHEN 7.5-325 MG PO TABS
ORAL_TABLET | Freq: Four times a day (QID) | ORAL | 0 refills | Status: DC
Start: 2016-09-05 — End: 2016-09-10

## 2016-09-05 MED ORDER — HYDROCODONE-ACETAMINOPHEN 7.5-325 MG PO TABS
ORAL_TABLET | Freq: Four times a day (QID) | ORAL | 0 refills | Status: DC
Start: 2016-09-05 — End: 2016-11-14

## 2016-09-05 MED ORDER — FENTANYL 50 MCG/HR TD PT72
50 MCG/HR | MEDICATED_PATCH | TRANSDERMAL | 0 refills | Status: AC
Start: 2016-09-05 — End: 2016-11-08

## 2016-09-05 NOTE — Telephone Encounter (Signed)
 Per Dr. Newt Lukes office, patient's injection with Dr. Arlington Calix has been cancelled for 09/11/2106. His fua for this and the  injection have also been cancelled. Patient is going back to Dr. Eduardo Osier for his back and neck care.   He was referred by Dr. Paulina Fusi to Dr. Arlington Calix

## 2016-09-05 NOTE — Telephone Encounter (Signed)
Because of patient living in [place].   All have been cancelled with Dr. Arlington Calix. He is r/s for all with Dr. Eduardo Osier. He may only be scheduled with Dr. Eduardo Osier in the future.

## 2016-09-05 NOTE — Progress Notes (Signed)
 Follow up: SPINE    CHIEF COMPLAINT:    Chief Complaint   Patient presents with   ??? Neck Pain       HISTORY OF PRESENT ILLNESS:                The patient is a 82 y.o. male here to follow up medication maintenance Follow-up for chronic neck pain and recent cervical epidural with improvements.  He is also had 6 month history of back pain radiating to his left lateral thigh.  Evaluation Dr. Paulina Fusi MRI showed central stenosis L2-3 and L4 5.  He had 1st epidural lumbar injections with 30% improvements Dr. Thedore Mins for 2nd injection schedule next week      Pain Assessment  Location of Pain: Neck  Severity of Pain: 2  Quality of Pain: Aching  Duration of Pain: Persistent  Frequency of Pain: Intermittent  Aggravating Factors: Bending, Stretching, Straightening, Exercise  Relieving Factors: Rest  Result of Injury: No  Work-Related Injury: No  Are there other pain locations you wish to document?: No    Current/Past Treatment:   ?? Physical Therapy:   ?? Chiropractic:     ?? Injection:   01/03/15: Lt C7-T1 CESI  12/04/15: Lt C7-T1 CESI   08/16/16: Lt C7-T1 CESI,  bilateral L3 transforaminal epidurals with Dr. Arlington Calix  ?? Medications:   Duragesic 50 ??g q.48 hours, hydrocodone 7.5 mg q.i.d.  ?? Surgery/Consult:    Function-Does the pain medication improve your ability to do:   ?? Personal care: Yes  ?? Housework: Yes   ?? Physical activity: Yes  ?? Social activity: Yes  ?? to work: na     Pain Scale: 1-10  With Meds:   2  Pain Scale: 1-10 Without Meds:8    Potential aberrant drug-related behavior:  ?? Aberrant behavior identified? NO  ?? Potential aberrant behavior identified? NO  ?? Reports loss are stolen prescriptions? NO  ?? Insist on certain medications by name? NO  ?? Purposeful oversedation? NO  ?? Increased dose without authorization? NO    Side Effects:     Past Medical History: Medical history form was reviewd today & scanned into the Media tab  Past Medical History:   Diagnosis Date   ??? Cancer (HCC)     PROSTATE CANCER   ??? Diabetes  mellitus (HCC)    ??? Pancreatitis 10/15/11        REVIEW OF SYSTEMS:   CONSTITUTIONAL: Denies unexplained weight loss, fevers, chills or fatigue  NEUROLOGIC: Denies tremors or seizures         PHYSICAL EXAM:    Vitals: Blood pressure 130/71, pulse 79, height 5' 10.08" (1.78 m), weight 184 lb 15.5 oz (83.9 kg).    GENERAL EXAM:  ?? General Apparence: Patient is adequately groomed with no evidence of malnutrition.  ?? Orientation: The patient is oriented to time, place and person.   ?? Mood & Affect:The patient's mood and affect are appropriate  ?? Vascular: Examination reveals no swelling tenderness in upper or lower extremities.   ?? Lymphatic: The lymphatic examination bilaterally reveals all areas to be without enlargement or induration  ?? Sensation: Sensation is intact without deficit  ?? Coordination/Balance: Good coordination     CERVICAL EXAMINATION:  ?? Inspection: Local inspection shows no step-off or bruising.  Cervical alignment is normal.     ?? Palpation: No evidence of tenderness at the midline, and trapezius.  Paraspinal tenderness is present. There is no step-off or paraspinal spasm.   ??  Range of Motion: Moderate loss of flexion and extension  ?? Strength: 5/5 bilateral upper extremities   ?? Special Tests:    ??   Spurling's, L'Hermitte's & Hoffman's negative bilaterally.   ??   Hawkins and Impingement tests are negative bilaterally.   ??  Cubital and Carpal tunnel Tinel's negative bilaterally.      ?? Skin:There are no rashes, ulcerations or lesions in right & left upper extremities.  ?? Reflexes: Bilaterally triceps, biceps and brachioradialis are trace  Clonus absent bilaterally at the feet.   ?? Additional Examinations:       ?? RIGHT UPPER EXTREMITY:  Inspection/examination of the right upper extremity does not show any tenderness, deformity or injury. Range of motion is full. There is no gross instability.  There are no rashes, ulcerations or lesions. Strength and tone are normal.  ?? LEFT UPPER EXTREMITY:  Inspection/examination of the left upper extremity does not show any tenderness, deformity or injury. Range of motion is full. There is no gross instability.  There are no rashes, ulcerations or lesions. Strength and tone are normal.    LUMBAR/SACRAL EXAMINATION:  ?? Inspection: Local inspection shows no step-off or bruising.  Lumbar alignment is normal.  Sagittal and Coronal balance is neutral.      ?? Palpation:   No evidence of tenderness at the midline.  No tenderness bilaterally at the paraspinal or trochanters.  There is no step-off or paraspinal spasm.   ?? Range of Motion: Moderate loss of flexion extension  ?? Strength:   Strength testing is 5/5 in all muscle groups tested.   ?? Special Tests:   Straight leg raise and crossed SLR negative.  Leg length and pelvis level.  0 out of 5 Waddell's signs.        ?? Skin: There are no rashes, ulcerations or lesions.  ?? Reflexes: Reflexes are symmetrically trace at the patellar and ankle tendons.  Clonus absent bilaterally at the feet.  ?? Gait & station: Small heel toe gait    ?? Additional Examinations:   ?? RIGHT LOWER EXTREMITY: Inspection/examination of the right lower extremity does not show any tenderness, deformity or injury. Range of motion is full. There is no gross instability.  There are no rashes, ulcerations or lesions. Strength and tone are normal.  ?? LEFT LOWER EXTREMITY:  Inspection/examination of the left lower extremity does not show any tenderness, deformity or injury. Range of motion is full. There is no gross instability. There are no rashes, ulcerations or lesions.  Strength and tone are normal.    Diagnostic Testing:     His recent lumbar MRI report reviewed showing significant lumbar stenosis L2-3 and L4-5    08/02/16 MRI cervical spine:    IMPRESSION]   ??   ??   Broad-based disc bulging/posterior osteophytic ridging with midline disc protrusions at the    C3-4 and C4-5 levels with moderate to severe narrowing of the central canal.   ??   Facet  arthropathy at C5-6 and to a lesser degree C6-7 with neural foraminal narrowings as    described.   ??         Impression:    Cervical spondylosis improving recent epidural  Lumbar stenosis aggravation ??6 months slowly improving after 1 recent epidural  History of prostate cancer  Opioid maintenance, low risk    Plan:       2nd lumbar epidural L4 5 transforaminal #2    We discussed lowering his medicine very slowly to  lower morphine equivalent levels and we'll decrease his q.48 Duragesic patch dosing to q.72.  We'll further decreases wean in 3 months    Orders Placed This Encounter   Medications   ??? fentaNYL (DURAGESIC) 50 MCG/HR     Sig: Place 1 patch onto the skin every 72 hours  Dx: M51.36, G89.29. Earliest Fill Date: 09/11/16     Dispense:  10 patch     Refill:  0   ??? fentaNYL (DURAGESIC) 50 MCG/HR     Sig: Place 1 patch onto the skin every 72 hours  Dx: M51.36, G89.29. Earliest Fill Date: 10/09/16     Dispense:  10 patch     Refill:  0   ??? fentaNYL (DURAGESIC) 50 MCG/HR     Sig: Place 1 patch onto the skin every 72 hours  Dx: M51.36, G89.29. Earliest Fill Date: 11/07/16     Dispense:  10 patch     Refill:  0   ??? HYDROcodone-acetaminophen (NORCO) 7.5-325 MG per tablet     Sig: Take 1 tablet by mouth 4 times daily  Dx: M51.36, G89.29. Earliest Fill Date: 09/11/16     Dispense:  120 tablet     Refill:  0   ??? HYDROcodone-acetaminophen (NORCO) 7.5-325 MG per tablet     Sig: Take 1 tablet by mouth 4 times daily  Dx: M51.36, G89.29. Earliest Fill Date: 10/09/16     Dispense:  120 tablet     Refill:  0   ??? HYDROcodone-acetaminophen (NORCO) 7.5-325 MG per tablet     Sig: Take 1 tablet by mouth 4 times daily  Dx: M51.36, G89.29. Earliest Fill Date: 11/07/16     Dispense:  120 tablet     Refill:  0           The risks and use of maintenance opiate medication were reviewed.  These include the risk of tolerance, addition, and abuse.  Potential side effects were also discussed.   Medications are to be taken as prescribed and not  escalated without prior agreement.  OARRS/KASPER reviewed & appropriate.   Opiate medications will only be prescribed through the office of Dr Carmon Ginsberg. Donato Schultz, M. D. unless notified otherwise         Isa Kohlenberg Donato Schultz

## 2016-09-09 NOTE — H&P (Signed)
HISTORY AND PHYSICAL/PRE-SEDATION ASSESSMENT    Patient:  Jordan Gutierrez, Jordan Gutierrez   DOB:  [birth year]  Medical Record No.:  [id number]   Date:  09-10-16  Physician:  Pierre BaliF Clifford Eivin Mascio, M.D.  [facility]     Nursing History and Physical reviewed and agreed upon.      Additional findings:    Allergies:  Review of patient's allergies indicates no known allergies.    Home Medications:    Prior to Admission medications    Medication Sig Start Date End Date Taking? Authorizing Provider   fentaNYL (DURAGESIC) 50 MCG/HR Place 1 patch onto the skin every 72 hours  Dx: M51.36, G89.29. Earliest Fill Date: 09/11/16 09/11/16 10/11/16  Pierre BaliF Clifford Etola Mull, MD   fentaNYL (DURAGESIC) 50 MCG/HR Place 1 patch onto the skin every 72 hours  Dx: M51.36, G89.29. Earliest Fill Date: 10/09/16 10/09/16 11/08/16  Pierre BaliF Clifford Arianna Delsanto, MD   fentaNYL (DURAGESIC) 50 MCG/HR Place 1 patch onto the skin every 72 hours  Dx: M51.36, G89.29. Earliest Fill Date: 11/07/16 11/07/16 12/07/16  Pierre BaliF Clifford Jona Zappone, MD   HYDROcodone-acetaminophen (NORCO) 7.5-325 MG per tablet Take 1 tablet by mouth 4 times daily  Dx: M51.36, G89.29. Earliest Fill Date: 09/11/16 09/11/16   Pierre BaliF Clifford Deyja Sochacki, MD   HYDROcodone-acetaminophen [facility](NORCO) 7.5-325 MG per tablet Take 1 tablet by mouth 4 times daily  Dx: M51.36, G89.29. Earliest Fill Date: 10/09/16 10/09/16   Pierre BaliF Clifford Jamayia Croker, MD   HYDROcodone-acetaminophen [facility](NORCO) 7.5-325 MG per tablet Take 1 tablet by mouth 4 times daily  Dx: M51.36, G89.29. Earliest Fill Date: 11/07/16 11/07/16   Pierre BaliF Clifford Zaelyn Noack, MD   cyanocobalamin 1000 MCG/ML injection  07/29/16   Historical Provider, MD   escitalopram (LEXAPRO) 20 MG tablet  05/08/16   Historical Provider, MD   NEXIUM 40 MG delayed release capsule every morning (before breakfast)  04/29/16   Historical Provider, MD   lisinopril (PRINIVIL;ZESTRIL) 5 MG tablet daily  05/10/16   Historical Provider, MD   TOPROL XL 25 MG extended release tablet daily  05/10/16    Historical Provider, MD   tamsulosin (FLOMAX) 0.4 MG capsule daily  05/28/16   Historical Provider, MD   fentaNYL (DURAGESIC) 50 MCG/HR Place 1 patch onto the skin every 48 hours  Dx: M50.30. Earliest Fill Date: 08/11/16 08/11/16 09/10/16  Pierre BaliF Clifford Jarita Raval, MD   HYDROcodone-acetaminophen [facility](NORCO) 7.5-325 MG per tablet Take 1 po q6 hours prn. Earliest Fill Date: 05/16/16 05/16/16   Pierre BaliF Clifford Kyndall Amero, MD   aspirin 81 MG tablet Take 81 mg by mouth daily    Historical Provider, MD   pregabalin (LYRICA) 50 MG capsule Take 50 mg by mouth 2 times daily.    Historical Provider, MD   finasteride (PROSCAR) 5 MG tablet Take 5 mg by mouth daily.    Historical Provider, MD   METFORMIN HCL PO Take  by mouth. TAKE 2 TABLETS TWICE A DAY     Historical Provider, MD   escitalopram (LEXAPRO) 10 MG tablet Take 10 mg by mouth daily.      Historical Provider, MD   ZOLPIDEM TARTRATE PO Take  by mouth nightly.      Historical Provider, MD       Vitals: Stable       PHYSICAL EXAM:  HENT: Airway patent and reviewed  Cardiovascular: Normal rate, regular rhythm, normal heart sounds.   Pulmonary/Chest: No wheezes. No rhonchi. No rales.   Abdominal: Soft. Bowel sounds are normal. No distension.  ASA CLASS:         []    I. Normal, healthy adult           [x]    II.  Mild systemic disease            []    III.  Severe systemic disease      Sedation plan:   [x]   Local              []   Minimal                  []   General anesthesia    Patient's condition acceptable for planned procedure/sedation.   Post Procedure Plan   Return to same level of care   ______________________     The risks and benefits as well as alternatives to the procedure have been discussed with the patient and or family.  The patient and or next of kin understands and agrees to proceed.    Joslyn Devon, M.D.

## 2016-09-10 ENCOUNTER — Inpatient Hospital Stay: Admit: 2016-09-10 | Attending: Physical Medicine & Rehabilitation | Primary: Internal Medicine

## 2016-09-10 LAB — POCT GLUCOSE: POC Glucose: 141 mg/dl — ABNORMAL HIGH (ref 70–99)

## 2016-09-10 MED ORDER — IOHEXOL 240 MG/ML IJ SOLN
240 | INTRAMUSCULAR | Status: AC
Start: 2016-09-10 — End: 2016-09-10

## 2016-09-10 MED ORDER — METHYLPREDNISOLONE ACETATE 40 MG/ML IJ SUSP
40 | INTRAMUSCULAR | Status: AC
Start: 2016-09-10 — End: 2016-09-10

## 2016-09-10 MED FILL — DEPO-MEDROL 40 MG/ML IJ SUSP: 40 MG/ML | INTRAMUSCULAR | Qty: 5

## 2016-09-10 MED FILL — OMNIPAQUE 240 MG/ML IJ SOLN: 240 MG/ML | INTRAMUSCULAR | Qty: 50

## 2016-09-10 NOTE — Op Note (Signed)
Patient:  Jordan Gutierrez, Jordan Gutierrez   Medical Record #:  [id number]   Date:  09-10-16  Physician:  Pierre Bali, M.D.  Facility: [facility]       Pre-op diagnosis: Lumbar radiculitis, lumbar spondylos, lumbar stenosis  Post-op diagnosis:  same  Procedure:  Bilateral L4 5 transforaminal epidural injection with flouroscopic guidance     Procedure Note:    The patient was admitted through pre-op and written consent was obtained.  The patient was advised of the risks and benefits of the procedure, including but not limited to the following: bleeding, pain, infection, temporary paralysis, nerve damage and spinal headache.  The patient was given the opportunity to ask questions.  There were no contraindications for this procedure.    The appropriate area was prepped and draped in a sterile fashion.  Landmarks were identified and marked.  A 23G spinal needle was advanced to the right L4 neural foramen using fluoroscopic guidance with ideal needle tip placement confirmed by multiple views.  Injection of contrast showed epidural flow.  There were no signs of intravascular or intrathecal injection.    40 mg depomedrol and 1cc 1% lidocaine were then injected per site.     There were no complications and the patient tolerated the procedure well.  The patient was transferred to the recovery area and monitored.  Discharge instructions were given.  The patient is to contact me for any post-procedure concerns.  The patient is to follow up as scheduled.    The same procedure was performed on the left side.    Pierre Bali, MD

## 2016-09-10 NOTE — Progress Notes (Signed)
NURSING CARE PLANS FOLLOWED     ?? Potential for anxiety-decrease anxiety-allow patient to verbalize  ?? Potential for infection-no infection-proper infection controls  ?? Potential for fall the patient will move to fall risk after procedure- through the recovery  phase   ?? Orient to environment  ?? Call light in reach  ?? Instruct to call for assistance prior to getting up  ?? Non skid footwear on   ?? Bed wheels locked and bed in lowest position - siderails up times two  ?? Potential for deep sedation-no deep sedation-know maximum allowable dose         adverse reactions and nursing considerations. Assess VS and LOC

## 2016-09-10 NOTE — Progress Notes (Addendum)
Omnipaque  240mg /ml      2ml   DepoMedrol 40 mg- 1ml  Lidocaine 1% 2ml  Marcaine 0   Celestone  0  mg  Kenalog  0 mg      L. Magnus SinningWenzel, RN  C. Jelus, RT  D. Louanne Skyeick, SA    Prep with Chloraprep by Colin Rheinanise Dick, SA

## 2016-09-10 NOTE — Progress Notes (Signed)
Pt stable.  Instructions give to patient and family, verbalized understanding.  Pt discharged per w/c.

## 2016-09-10 NOTE — Discharge Instructions (Signed)
[facility]          [phone]  POST EPIDURAL STEROID INJECTION    PATIENT INSTRUCTIONS:  1)  DIET   .  RESUME NORMAL DIET   .  RESUME ALL PRIOR MEDICATIONS  2)  ACTIVITY  .   DO NOT STAY ALONE FOR 4-6 HOURS AFTER THE PROCEDURE  .  REST TODAY  .  DO NOT DRIVE TODAY AND 24 HOURS IF YOU RECEIVED SEDATION.  IF YOU ARE   SEEN DRIVING DURING THIS TIME THE PROPER AUTHORITIES WILL BE NOTIFIED.  .  DO NOT SIGN ANY LEGAL DOCUMENTS, MAKE ANY MAJOR DECISIONS, OR BE INVOLVED IN WORK DECISIONS FOR THE REMAINDER OF THE DAY.   .  RESUME NORMAL ACTIVITIES.  DO NOT OVER EXERT YOURSELF.  .  SHOWER OR BATHE AS NORMAL  3)  SITE CARE   .  MAY USE ICE TO SITE IF NEEDED              .  KEEP SITE DRY FOR 12 HOURS.  IF A BAND-AID WAS APPLIED TO THE                 INJECTION  SITE REMOVE BAND-AID THE FOLLOWING DAY  .  OBSERVE PUNCTURE SITE FOR SIGNS OF INFECTION (REDNESS, WARMTH, SWELLING, PURULENT DRAINAGE, INCREASED TENDERNESS OR FEVER GREATER THAN 101)  .  REPORT SIGNS OF INFECTION TO THE PHYSICIAN  4)  EXPECTED SIDE EFFECTS   .  FLUID RETENTION   .  NERVOUS ENERGY   .  SLEEPLESSNESS   .  MUSCLE SPASMS  . TEMPORARY WEAKNESS/TINGLING/NUMBNESS IN THE EXTREMITIES   . PAIN AT INJECTION SITE   .  DROWSINESS  .  POSSIBLE ELEVATION OF BLOOD SUGARS IF YOU ARE DIABETIC  5)  TO REACH DR. VALENTIN  .  CALL IF SYMPTOMS WORSEN SEVERELY OR FOR A SEVERE HEADACHE THAT WORSENS WHEN UPRIGHT     .  CALL IF YOU DEVELOP A FEVER GREATER THAN 101  .  CALL [phone] TO MAKE A FOLLOW UP APPOINTMENT WITH DR. VALENTIN   .  CALL REFERRING DOCTOR IF ANY PROBLEMS   6)  ADDITIONAL INSTRUCTIONS  .  IT MAY TAKE 24 TO 36 HOURS TO FEEL IMPROVEMENT

## 2016-09-11 ENCOUNTER — Encounter: Attending: Physical Medicine & Rehabilitation | Primary: Internal Medicine

## 2016-10-03 NOTE — Progress Notes (Signed)
 Follow up Injection: SPINE    CHIEF COMPLAINT:    Chief Complaint   Patient presents with   ??? Injections       HISTORY OF PRESENT ILLNESS:                The patient is a 82 y.o. male here to follow up After bilateral L4 5 TX ESI #3 from 09/10/2016 for a 7 month history of aching low back pain radiating into the left lateral thigh.  He has known severe lumbar stenosis.  Symptoms increased with any walking or standing.  Relief with sitting and resting.  Overall states he is approximately 50% improved with recent ESI.  He denies any progressive numbness tingling or weakness.  No recent bowel or bladder changes.  No side effects of the procedure.    He also has a history of chronic aching neck/trap pain for which he takes Duragesic 50 q48 and Norco 7.5 QID PRN.  His chronic cervical pain is stable at this time.  Denies any upper extremity radiating pain.  No progressive numbness tingling weakness.      Pain Assessment  Location of Pain: Back  Severity of Pain: 5  Quality of Pain: Aching  Duration of Pain: Persistent  Frequency of Pain: Intermittent  Aggravating Factors: Bending, Straightening, Exercise, Kneeling, Squatting, Standing, Walking, Stairs, Stretching  Relieving Factors: Rest  Result of Injury: No  Work-Related Injury: No  Are there other pain locations you wish to document?: No      The post injection form was reviewed & scanned into the medical record today.        Past/Current Treatment:   PT: Yes  Meds: Duragesic 50 ??g q.48 hours, hydrocodone 7.5 mg q.i.d.  Injection:   01/03/15: Lt C7-T1 CESI  12/04/15: Lt C7-T1 CESI   08/16/16: Lt C7-T1 CESI--60%  -B L3 TX ESI--Dr. Arlington CalixSingla  09/10/16 Bil L4-5 TX ESI--50%  Sx: Shoulder, Dr. Paulina FusiHess    Post injection side Effects:  1.Headache: no              2.Cramping:  no    3.Fever/Chills: no            4. Other: no    Past Medical History: Medical history form was reviewed & scanned into the chart until Media tab  Past Medical History:   Diagnosis Date   ??? Cancer (HCC)      PROSTATE CANCER   ??? Diabetes mellitus (HCC)    ??? Pancreatitis 10/15/11        REVIEW OF SYSTEMS:   CONSTITUTIONAL: Denies unexplained weight loss, fevers, chills or fatigue  NEUROLOGIC: Denies tremors or seizures         PHYSICAL EXAM:    Vitals: Blood pressure 114/65, pulse 95, height 5' 10.08" (1.78 m), weight 184 lb 1.4 oz (83.5 kg).    GENERAL EXAM:  ?? General Apparence: Patient is adequately groomed with no evidence of malnutrition.  ?? Orientation: The patient is oriented to time, place and person.   ?? Mood & Affect:The patient's mood and affect are appropriate   ?? Vascular: Examination reveals no swelling tenderness in upper or lower extremities.   ?? Lymphatic: The lymphatic examination bilaterally reveals all areas to be without enlargement or induration  ?? Sensation: Sensation is intact without deficit  ?? Coordination/Balance: Good coordination   LUMBAR/SACRAL EXAMINATION:  ?? Inspection: Local inspection shows no step-off or bruising.  Lumbar alignment is normal.  Sagittal and Coronal balance is neutral.      ??  Palpation:   No evidence of tenderness at the midline.  No tenderness bilaterally at the paraspinal or trochanters.  There is no step-off or paraspinal spasm.   ?? Range of Motion:    Able to sit for flex without pain  ?? Strength:   Strength testing is 5/5 in all muscle groups tested.   ?? Special Tests:   Straight leg raise and crossed SLR negative.  Leg length and pelvis level.  0 out of 5 Waddell's signs.        ?? Skin: There are no rashes, ulcerations or lesions.  ?? Reflexes: Reflexes are symmetrically 2+ at the patellar and ankle tendons.  Clonus absent bilaterally at the feet.  ?? Gait & station: Forward flexed unassisted   ?? Additional Examinations:   ?? RIGHT LOWER EXTREMITY: Inspection/examination of the right lower extremity does not show any tenderness, deformity or injury. Range of motion is full. There is no gross instability.  There are no rashes, ulcerations or lesions. Strength and tone  are normal.  ?? LEFT LOWER EXTREMITY:  Inspection/examination of the left lower extremity does not show any tenderness, deformity or injury. Range of motion is full. There is no gross instability. There are no rashes, ulcerations or lesions.  Strength and tone are normal.    Diagnostic Testing:   His recent lumbar MRI report reviewed showing significant lumbar stenosis L2-3 and L4-5  ??  08/02/16 MRI cervical spine:    IMPRESSION]   ????   ????   Broad-based disc bulging/posterior osteophytic ridging with midline disc protrusions at the    C3-4 and C4-5 levels with moderate to severe narrowing of the central canal.   ????   Facet arthropathy at C5-6 and to a lesser degree C6-7 with neural foraminal narrowings as    described.   ????   ??      Impression:  1) 76mo lbp, left lumbar radiculitis--50% improved  2) Severe lumbar stenosis   3) Chronic neck/trap pain w/underlying spondylosis  4) H/o prostate cancer  5) Opioid maintenance, low risk       Plan:  1) PT flexion based exercises provided  2) I provided him w/Dr. Delene Loll card should he decide to seek surgical consult.  He has had 3 ESI's last far this year  3) F/u as planned in November for medication maintenance           Rohith Fauth Roxana Hires  [facility]

## 2016-10-15 NOTE — Telephone Encounter (Signed)
Attempted to reach pt. Not able to leave VM. Received message that memory was full.

## 2016-10-15 NOTE — Telephone Encounter (Signed)
Dr Eduardo OsierValentin is referring patient to DR Sande BrothersFerree for his back- he had MRI in Aug and several ESI that did not seem to help      He is scheduled to see DR Sande BrothersFerree 11/27 but wants to know if he can be seen sooner due to the pain?     pls call him at [phone]

## 2016-11-13 NOTE — Telephone Encounter (Signed)
Spoke with patient and offered earlier date but he was unable to take that appt.

## 2016-11-14 ENCOUNTER — Ambulatory Visit
Admit: 2016-11-14 | Discharge: 2016-11-14 | Payer: MEDICARE | Attending: Physician Assistant | Primary: Internal Medicine

## 2016-11-14 DIAGNOSIS — G894 Chronic pain syndrome: Secondary | ICD-10-CM

## 2016-11-14 MED ORDER — FENTANYL 50 MCG/HR TD PT72
50 MCG/HR | MEDICATED_PATCH | TRANSDERMAL | 0 refills | Status: AC
Start: 2016-11-14 — End: 2017-01-04

## 2016-11-14 MED ORDER — HYDROCODONE-ACETAMINOPHEN 7.5-325 MG PO TABS
ORAL_TABLET | ORAL | 0 refills | Status: DC
Start: 2016-11-14 — End: 2016-12-06

## 2016-11-14 MED ORDER — HYDROCODONE-ACETAMINOPHEN 5-325 MG PO TABS
5-325 MG | ORAL_TABLET | ORAL | 0 refills | Status: DC
Start: 2016-11-14 — End: 2016-11-14

## 2016-11-14 MED ORDER — FENTANYL 50 MCG/HR TD PT72
50 MCG/HR | MEDICATED_PATCH | TRANSDERMAL | 0 refills | Status: DC
Start: 2016-11-14 — End: 2016-12-06

## 2016-11-14 MED ORDER — HYDROCODONE-ACETAMINOPHEN 7.5-325 MG PO TABS
7.5-325 | ORAL_TABLET | Freq: Four times a day (QID) | ORAL | 0 refills | Status: DC
Start: 2016-11-14 — End: 2016-12-06

## 2016-11-14 NOTE — Progress Notes (Signed)
 Follow up: SPINE    CHIEF COMPLAINT:    Chief Complaint   Patient presents with   ??? Back Pain       HISTORY OF PRESENT ILLNESS:                The patient is a 82 y.o. male here to follow up medication maintenance For history of chronic aching left-sided neck and trap pain.  In addition over the last 8-9 months he has been suffering with increasing aching left-sided low back and lateral thigh pain.  He has known severe lumbar stenosis.  Symptoms are increased with any walking or standing.  Relief with sitting and resting.  He reported temporary relief with recent ESI.  He still reports good improvement with Duragesic 50 ??g now decreased to q72 & Norco 7.5/325 QID PRN without side effects.  This medication does allow him to be more functional he is able to walk and stand longer distances perform ADLs independently.  He denies any progressive numbness tingling or weakness.  No recent bowel or bladder changes.    Pending surgical consult for his lumbar spine next week with Dr. Sande Brothers    Last visit decreased to Duragesic q72 (rather than 48)      Pain Assessment  Location of Pain: Back  Severity of Pain: 6  Quality of Pain: Aching  Duration of Pain: Persistent  Frequency of Pain: Constant  Aggravating Factors: Bending, Stretching, Straightening, Exercise, Kneeling, Squatting, Standing, Walking, Stairs  Relieving Factors: Rest  Result of Injury: No  Work-Related Injury: No  Are there other pain locations you wish to document?: No    Past/Current Treatment:   PT: Yes  Meds: Duragesic 50 ??g q.48 hours, hydrocodone 7.5 mg q.i.d.  Injection:   01/03/15: Lt C7-T1 CESI  12/04/15: Lt C7-T1 CESI   08/16/16: Lt C7-T1 CESI--60%  -B L3 TX ESI--Dr. Arlington Calix  09/10/16 Bil L4-5 TX ESI--50%  Sx: Shoulder, Dr. Paulina Fusi; pending surgical spine consult with Dr. Sande Brothers    Function-Does the pain medication improve your ability to do:   ?? Personal care: Yes  ?? Housework: Yes   ?? Physical activity: Yes  ?? Social activity: Yes    Pain Scale:  1-10  With Meds:   4-5/10  Pain Scale: 1-10 Without Meds: 10+/10    Potential aberrant drug-related behavior:  ?? Aberrant behavior identified? NO  ?? Potential aberrant behavior identified? NO  ?? Reports loss are stolen prescriptions? NO  ?? Insist on certain medications by name? NO  ?? Purposeful oversedation? NO  ?? Increased dose without authorization? NO  ?? MED: 120 & 30= 150  ?? Last UDS: June 2016    Side Effects: Denies    Past Medical History: Medical history form was reviewd today & scanned into the Media tab  Past Medical History:   Diagnosis Date   ??? Cancer (HCC)     PROSTATE CANCER   ??? Diabetes mellitus (HCC)    ??? Pancreatitis 10/15/11        REVIEW OF SYSTEMS:   CONSTITUTIONAL: Denies unexplained weight loss, fevers, chills or fatigue  NEUROLOGIC: Denies tremors or seizures         PHYSICAL EXAM:    Vitals: Blood pressure 118/89, pulse 86, height 5' 10.08" (1.78 m), weight 184 lb 1.4 oz (83.5 kg).    GENERAL EXAM:  ?? General Apparence: Patient is adequately groomed with no evidence of malnutrition.  ?? Orientation: The patient is oriented to time, place and person.   ??  Mood & Affect:The patient's mood and affect are appropriate  ?? Lymphatic: The lymphatic examination bilaterally reveals all areas to be without enlargement or induration  ?? Sensation: Sensation is intact without deficit  ?? Coordination/Balance: Good coordination     CERVICAL EXAMINATION:  ?? Inspection: Local inspection shows no step-off or bruising.  Cervical alignment is normal.     ?? Palpation: No evidence of tenderness at the midline, and trapezius.  Paraspinal tenderness is present. There is no step-off or paraspinal spasm.   ?? Range of Motion: moderate loss flexion, severe loss extension   ?? Strength: 5/5 bilateral upper extremities   ?? Special Tests:    ??   Spurling's, L'Hermitte's & Hoffman's negative bilaterally.   ?? Skin:There are no rashes, ulcerations or lesions in right & left upper extremities.  ?? Reflexes: Bilaterally triceps,  biceps and brachioradialis are 1+.  Clonus absent bilaterally at the feet.   ?? Additional Examinations:       ?? RIGHT UPPER EXTREMITY:  Inspection/examination of the right upper extremity does not show any tenderness, deformity or injury. Range of motion is full. There is no gross instability.  There are no rashes, ulcerations or lesions. Strength and tone are normal.  ?? LEFT UPPER EXTREMITY: Inspection/examination of the left upper extremity does not show any tenderness, deformity or injury. Range of motion is full. There is no gross instability.  There are no rashes, ulcerations or lesions. Strength and tone are normal.    LUMBAR/SACRAL EXAMINATION:  ?? Inspection: Local inspection shows no step-off or bruising.  Lumbar alignment is normal.  Sagittal and Coronal balance is neutral.      ?? Palpation:   No evidence of tenderness at the midline.  No tenderness bilaterally at the paraspinal or trochanters.  There is no step-off or paraspinal spasm.   ?? Range of Motion:  Able to sit forward flexed    ?? Strength:   Strength testing is 5/5 in all muscle groups tested.   ?? Special Tests:   Straight leg raise and crossed SLR negative.  Leg length and pelvis level.  0 out of 5 Waddell's signs.        ?? Skin: There are no rashes, ulcerations or lesions.  ?? Reflexes: Reflexes are symmetrically 1+ at the patellar and ankle tendons.  Clonus absent bilaterally at the feet.  ?? Gait & station: forward flexed unassisted      ?? Additional Examinations:   ?? RIGHT LOWER EXTREMITY: Inspection/examination of the right lower extremity does not show any tenderness, deformity or injury. Range of motion is full. There is no gross instability.  There are no rashes, ulcerations or lesions. Strength and tone are normal.  ?? LEFT LOWER EXTREMITY:  Inspection/examination of the left lower extremity does not show any tenderness, deformity or injury. Range of motion is full. There is no gross instability. There are no rashes, ulcerations or lesions.   Strength and tone are normal.    Diagnostic Testing:   His recent lumbar MRI report reviewed showing significant lumbar stenosis L2-3 and L4-5  ??  08/02/16??MRI cervical spine: ??  IMPRESSION]   ????   ????   Broad-based disc bulging/posterior osteophytic ridging with midline disc protrusions at the    C3-4 and C4-5 levels with moderate to severe narrowing of the central canal.   ????   Facet arthropathy at C5-6 and to a lesser degree C6-7 with neural foraminal narrowings as    described.   ????   ??  ??  UDS June 2016 positive for OxyIR as expected    ??  Impression:  1) 66mo lbp, left lumbar radiculitis, neurogenic claudication  2) Severe lumbar stenosis L2-3, L4 5  3) Chronic neck/trap pain w/underlying spondylosis--stable  4) H/o prostate cancer  5) Opioid maintenance, low risk       Plan:    1) Duragesic 50mcg q72 x3 scripts--this was weaned down last visit form q48  2) Norco 7.5/325 I po QID PRN #120 x 3 scripts  3) He is pending sx consult w/Dr. Sande BrothersFerree next week  4) Despite higher MED he reports increased functionality with decreased pain for his chronic neck and low back issues with out side effects.  5) F/u 6mo UDS at that time    The risks and use of maintenance opiate medication were reviewed.  These include the risk of tolerance, addition, and abuse.  Potential side effects were also discussed.   Medications are to be taken as prescribed and not escalated without prior agreement.  OARRS/KASPER reviewed & appropriate.   Opiate medications will only be prescribed through the office of Dr Carmon GinsbergF. Donato Schultzlifford Valentin, M. D. unless notified otherwise             Wonda CeriseMegan Marie Jany Buckwalter  [facility]

## 2016-11-18 NOTE — Telephone Encounter (Signed)
Mailed [facility] complete medical records for [facility] to the patient at the address on release.

## 2016-11-25 ENCOUNTER — Ambulatory Visit: Admit: 2016-11-25 | Discharge: 2016-12-03 | Payer: MEDICARE | Attending: Surgical | Primary: Internal Medicine

## 2016-11-25 DIAGNOSIS — M48062 Spinal stenosis, lumbar region with neurogenic claudication: Secondary | ICD-10-CM

## 2016-11-25 MED ORDER — MUPIROCIN 2 % EX OINT
2 % | CUTANEOUS | 0 refills | Status: AC
Start: 2016-11-25 — End: 2016-12-02

## 2016-11-25 NOTE — Progress Notes (Signed)
History of present illness:   [name]  is a pleasant 82 y.o. male with a PMH of pancreatitis, diabetes and prostate cancer kindly referred by Dr. Rise Patienceliff Valentin for consultation regarding his LBP and left greater than right leg pain. He states his pain began insidiously about 6 months ago. His pain has steadily increased since then. He rates his back pain 9-10/10 and leg pain 4/10. He describes the pain as aching and sharp. The leg pain radiates down the lateral aspect of his leg to his knees bilaterally, left greater than right. His back pain is more severe than has leg pain, and worse with standing and walking. He denies numbness and tingling in his legs.  He denies weakness of his legs and denies bowel or bladder dysfunction. He states he can sit as long as he likes and stand for a maximum of 10 minutes. The pain moderately disrupts his sleep. He has tried medications and 2 epidural injections without relief.  He takes Norco and a Fentanyl patch.    Past medical history:  His past medical history has been reviewed and is significant for pancreatitis, diabetes and prostate cancer.    Past surgical history:  His past surgical history has been reviewed and is non-contributory to his present illness.    His medications and allergies were reviewed.    Social history:  His social history has been reviewed and he is a former smoker.    Review of symptoms:  Patient's review of symptoms was reviewed and is significant for back pain and negative for recent weight loss, fatigue, chills, visual disturbances, blood in stool or urine, recent infection, chest pain, or shortness of breath. All other ROS were negative.    Physical examination:  [name] most recent vitals:  Vitals  BP: (!) 144/82  Pulse: 89  Height: 5\' 10"  (177.8 cm)  Weight: 180 lb (81.6 kg)  Body mass index is 25.83 kg/m??.    General exam:  He is well-developed and well-nourished, is in obvious pain and alert and oriented to person,  place, and time. He demonstrates appropriate mood and affect. His skin is warm and dry. His gait is normal and he walks heel to toe without limp or instability.     Back:  He stands with slight lumbar flexion. His lumbar flexion, extension and lateral bending are moderately reduced with pain. He has mild tenderness over his lumbar spine without obvious muscle spasm. The skin over his lumbar spine is normal without a surgical scar.     Lower extremities:  He has 5/5 motor strength of bilateral lower extremities. He has a negative straight leg raise, bilaterally.  Deep tendon reflexes at knees and achilles are 2+. Sensation is intact to light touch L3 to S1 bilaterally. He has no clonus. Hip range of motion painless.    Abdomen:  Non-tender and non-distended.  Abdomen is not obese. No rash.    Imaging:  I reviewed MRI radiology report of his lumbar spine from 08/02/16. They note multilevel degenerative changes with severe stenosis at L2-3 and L4-5.     Assessment:  Lumbar stenosis    Plan:  We discussed treatment options including observation, additional oral steroids, physical therapy, epidural injection and microlumbar decompression. He wishes to proceed with L2-3 MLL, possible L4-5 MLL. Dr. Sande BrothersFerree discussed the risks, benefits, and alternatives to surgery including the risks of nerve or vessel injury, paralysis, spinal blood clot, spinal fluid leak, death, infection, need for additional  surgery for recurrent herniation or low back pain, failure of surgery to alleviate his symptoms and worse symptoms following surgery. He understands these risks and wishes to proceed with surgery. His questions were answered. He was instructed to call us emergently if he begins to experience bowel or bladder dysfunction, saddle anesthesia, increasing muscle weakness, or worsening leg symptoms.  He will mail his lumbar MRI disc with images to our office prior to surgery for Dr. Sande BrothersFerree to review.       Note dictated by Jolene ProvostKelly Raffael Bugarin  PA-C, patient also seen and examined by Dr. Sande BrothersFerree.

## 2016-11-29 NOTE — Telephone Encounter (Signed)
CPT  63047  DX  M48.062  IP SX AUTH NPR FOR THIS PLAN  PROCEDURE  MICROLUMBAR LAMINECTOMY  L2 - L3 POSSIBLE L4 - L5  [place] [facility]  WITH  FERREE   -  MEDICARE

## 2016-12-02 NOTE — Telephone Encounter (Signed)
Brit   Bev from Dr Heloise PurpuraNalagalta office called, patients PCP  He had a office visit 11/12/16- wants to know if this is ok for the pre op- since it has only been two weeks  Or will he need another visit?    Bev needs to know ASAP , patient sx is 12/8- thanks  Pls call her [phone]

## 2016-12-02 NOTE — Telephone Encounter (Signed)
Left VM for Bev.

## 2016-12-02 NOTE — Patient Instructions (Signed)
1. Do not eat or drink anything after 12 midnight prior to surgery. This includes no water, chewing gum mints, or ice chips. You may brush your teeth and gargle the day of surgery but DO NOT SWALLOW THE WATER.   2. Please see your family doctor/pediatrician for a history and physical and/or concerning medications. Bring any test results/reports from your physician's office. If you are under the care of a heart doctor or specialist please be aware that you may be asked to see him or her for clearance.   3. You may be asked to stop blood thinners such as Coumadin, Plavix, Fragmin, and Lovenox or Anti-inflammatories such as Aspirin, Ibuprofen, Advil, and Naproxen prior to your surgery. Please check with your doctor before stopping these or any other medications.   4. Do not smoke, and do not drink any alcoholic beverages 24 hours prior to surgery.    5. You MUST make arrangements for a responsible adult to take you home after your surgery. For your safety, you will not be allowed to leave alone or drive yourself home. Your surgery will be cancelled if you do not have a ride home.Also for your safety, it is strongly suggested someone stay with you the first 24 hrs after your surgery.   6. A parent/legal guardian must accompany a child scheduled for surgery and plan to stay at the hospital until the child is discharged.  Please do not bring other children with you.   7. For your comfort,please wear simple, loose fitting clothing to the hospital.  Please do not bring valuables (money, credit cards, checkbooks, etc.) Do not wear any makeup (including no eye makeup) or nail polish on your fingers or toes.   8. For your safety, please DO NOT wear any jewelry or piercings on day of surgery.  All body piercing jewelry must be removed.   9. If you have dentures, they will be removed before going to the OR; for your convenience we will provide you with a container.  If you wear contact lenses or glasses, they will be removed,  they will be removed, please bring a case for them.   10. If appicable,Please see your family doctor/pediatrician for a history & physical and/or concerning medications.  Bring any test results/reports from your physician's office.   11. Remember to bring Blood Bank bracelet to the hospital on the day of surgery.   12. If you have a Living Will and Durable Power of Attorney for Healthcare, please bring in a copy.   13. Notify your Surgeon if you develop any illness between now and surgery  time, cough, cold, fever, sore throat, nausea, vomiting, etc.  Please notify your surgeon if you experience dizziness, shortness of breath or blurred vision between now & the time of your surgery   14. DO NOT shave your operative site 96 hours prior to surgery. For face & neck surgery, men may use an electric razor 48 hours prior to surgery.   15. Shower the night before surgery with ___Antibacterial soap _x__Hibiclens   16. To provide excellent care visitors will be limited to one in the room at any given time.               17.  Please bring picture ID and insurance card.    18.  Visit our web site for additional information:  [email or web address].

## 2016-12-02 NOTE — Progress Notes (Signed)
Obstructive Sleep Apnea (OSA) Screening     Patient:  Jordan Gutierrez, [name]    Date of Birth: 03-28-1929      Medical Record #:  [id number]                     Date:  12/02/2016     1.  Are you a loud and/or regular snorer?     []   Yes       [x]  No    2.  Have you been observed to gasp or stop breathing during sleep?     []   Yes       [x]  No    3.  Do you feel tired or groggy upon awakening or do you awaken with a headache?           []   Yes       [x]  No    4.  Are you often tired or fatigued during the wake time hours?     []   Yes       [x]  No    5.  Do you fall asleep sitting, reading, watching TV or driving?     []   Yes       [x]  No    6.  Do you often have problems with memory or concentration?     []   Yes       [x]  No    **If patient's score is ?3 they are considered high risk for OSA.  Notify the anesthesiologist of the high risk and document in focus note.    Note:  If the patient's BMI is more than 35 kg m???? , has neck circumference > 40 cm, and/or high blood pressure the risk is greater (?? American Sleep Apnea Association, 2006).

## 2016-12-06 ENCOUNTER — Ambulatory Visit: Admit: 2016-12-06 | Primary: Internal Medicine

## 2016-12-06 ENCOUNTER — Encounter: Admit: 2016-12-06 | Payer: MEDICARE | Attending: Orthopaedic Surgery of the Spine | Primary: Internal Medicine

## 2016-12-06 ENCOUNTER — Encounter

## 2016-12-06 ENCOUNTER — Inpatient Hospital Stay
Admit: 2016-12-06 | Discharge: 2016-12-07 | Disposition: A | Discharge status: Home / Self Care | Source: Ambulatory Visit | Attending: Orthopaedic Surgery of the Spine | Admitting: Orthopaedic Surgery of the Spine

## 2016-12-06 DIAGNOSIS — R52 Pain, unspecified: Secondary | ICD-10-CM

## 2016-12-06 LAB — POCT GLUCOSE
POC Glucose: 155 mg/dl — ABNORMAL HIGH (ref 70–99)
POC Glucose: 206 mg/dl — ABNORMAL HIGH (ref 70–99)

## 2016-12-06 MED ORDER — BUPIVACAINE-EPINEPHRINE (PF) 0.25% -1:200000 IJ SOLN
INTRAMUSCULAR | Status: AC
Start: 2016-12-06 — End: 2016-12-06

## 2016-12-06 MED ORDER — TAMSULOSIN HCL 0.4 MG PO CAPS
0.4 MG | Freq: Every day | ORAL | Status: DC
Start: 2016-12-06 — End: 2016-12-07
  Administered 2016-12-06 – 2016-12-07 (×2): 0.4 mg via ORAL

## 2016-12-06 MED ORDER — INSULIN LISPRO 100 UNIT/ML SC SOLN
100 UNIT/ML | Freq: Three times a day (TID) | SUBCUTANEOUS | Status: DC
Start: 2016-12-06 — End: 2016-12-07

## 2016-12-06 MED ORDER — CEFAZOLIN 2000 MG IN 20 ML SWFI IV SYRINGE (PREMIX)
Status: AC
Start: 2016-12-06 — End: 2016-12-06
  Administered 2016-12-06: 17:00:00 2 g via INTRAVENOUS

## 2016-12-06 MED ORDER — CEFAZOLIN SODIUM 1 G IJ SOLR
1 | INTRAMUSCULAR | Status: AC
Start: 2016-12-06 — End: 2016-12-06

## 2016-12-06 MED ORDER — NORMAL SALINE FLUSH 0.9 % IV SOLN
0.9 % | INTRAVENOUS | Status: DC | PRN
Start: 2016-12-06 — End: 2016-12-07

## 2016-12-06 MED ORDER — HYDROMORPHONE HCL 1 MG/ML IJ SOLN
1 MG/ML | INTRAMUSCULAR | Status: DC | PRN
Start: 2016-12-06 — End: 2016-12-07

## 2016-12-06 MED ORDER — LACTATED RINGERS IV SOLN
INTRAVENOUS | Status: DC
Start: 2016-12-06 — End: 2016-12-06
  Administered 2016-12-06: 17:00:00 via INTRAVENOUS

## 2016-12-06 MED ORDER — ACETAMINOPHEN 10 MG/ML IV SOLN
10 MG/ML | INTRAVENOUS | Status: AC
Start: 2016-12-06 — End: 2016-12-06

## 2016-12-06 MED ORDER — OXYCODONE-ACETAMINOPHEN 5-325 MG PO TABS
5-325 MG | ORAL_TABLET | ORAL | 0 refills | Status: DC
Start: 2016-12-06 — End: 2017-02-17

## 2016-12-06 MED ORDER — ONDANSETRON HCL 4 MG/2ML IJ SOLN
4 MG/2ML | Freq: Four times a day (QID) | INTRAMUSCULAR | Status: DC | PRN
Start: 2016-12-06 — End: 2016-12-07

## 2016-12-06 MED ORDER — DEXTROSE 50 % IV SOLN
50 % | INTRAVENOUS | Status: DC | PRN
Start: 2016-12-06 — End: 2016-12-07

## 2016-12-06 MED ORDER — NORMAL SALINE FLUSH 0.9 % IV SOLN
0.9 | INTRAVENOUS | Status: DC | PRN
Start: 2016-12-06 — End: 2016-12-06

## 2016-12-06 MED ORDER — MEPERIDINE HCL 50 MG/ML IJ SOLN
50 MG/ML | INTRAMUSCULAR | Status: DC | PRN
Start: 2016-12-06 — End: 2016-12-07

## 2016-12-06 MED ORDER — CYCLOBENZAPRINE HCL 10 MG PO TABS
10 | Freq: Three times a day (TID) | ORAL | Status: DC | PRN
Start: 2016-12-06 — End: 2016-12-07

## 2016-12-06 MED ORDER — ASPIRIN 81 MG PO TBEC
81 MG | Freq: Every day | ORAL | Status: DC
Start: 2016-12-06 — End: 2016-12-07
  Administered 2016-12-07: 14:00:00 81 mg via ORAL

## 2016-12-06 MED ORDER — HYDRALAZINE HCL 20 MG/ML IJ SOLN
20 MG/ML | INTRAMUSCULAR | Status: DC | PRN
Start: 2016-12-06 — End: 2016-12-07

## 2016-12-06 MED ORDER — INSULIN LISPRO 100 UNIT/ML SC SOLN
100 UNIT/ML | Freq: Every evening | SUBCUTANEOUS | Status: DC
Start: 2016-12-06 — End: 2016-12-07
  Administered 2016-12-07: 03:00:00 4 [IU] via SUBCUTANEOUS

## 2016-12-06 MED ORDER — DOCUSATE SODIUM 100 MG PO CAPS
100 MG | ORAL_CAPSULE | Freq: Two times a day (BID) | ORAL | 0 refills | Status: AC | PRN
Start: 2016-12-06 — End: ?

## 2016-12-06 MED ORDER — ONDANSETRON HCL 4 MG/2ML IJ SOLN
4 MG/2ML | INTRAMUSCULAR | Status: DC | PRN
Start: 2016-12-06 — End: 2016-12-07

## 2016-12-06 MED ORDER — NORMAL SALINE FLUSH 0.9 % IV SOLN
0.9 | Freq: Two times a day (BID) | INTRAVENOUS | Status: DC
Start: 2016-12-06 — End: 2016-12-06

## 2016-12-06 MED ORDER — OXYCODONE-ACETAMINOPHEN 5-325 MG PO TABS
5-325 MG | ORAL | Status: AC | PRN
Start: 2016-12-06 — End: 2016-12-06

## 2016-12-06 MED ORDER — ESCITALOPRAM OXALATE 10 MG PO TABS
10 MG | Freq: Every day | ORAL | Status: DC
Start: 2016-12-06 — End: 2016-12-07
  Administered 2016-12-06 – 2016-12-07 (×2): 20 mg via ORAL

## 2016-12-06 MED ORDER — ACETAMINOPHEN 325 MG PO TABS
325 MG | ORAL | Status: DC | PRN
Start: 2016-12-06 — End: 2016-12-07

## 2016-12-06 MED ORDER — LISINOPRIL 5 MG PO TABS
5 MG | Freq: Every day | ORAL | Status: DC
Start: 2016-12-06 — End: 2016-12-07
  Administered 2016-12-06 – 2016-12-07 (×2): 5 mg via ORAL

## 2016-12-06 MED ORDER — DOCUSATE SODIUM 100 MG PO CAPS
100 MG | Freq: Two times a day (BID) | ORAL | Status: DC
Start: 2016-12-06 — End: 2016-12-07
  Administered 2016-12-07 (×2): 100 mg via ORAL

## 2016-12-06 MED ORDER — NORMAL SALINE FLUSH 0.9 % IV SOLN
0.9 % | Freq: Two times a day (BID) | INTRAVENOUS | Status: DC
Start: 2016-12-06 — End: 2016-12-07

## 2016-12-06 MED ORDER — LABETALOL HCL 5 MG/ML IV SOLN
5 MG/ML | INTRAVENOUS | Status: DC | PRN
Start: 2016-12-06 — End: 2016-12-07

## 2016-12-06 MED ORDER — SODIUM CHLORIDE 0.9 % IJ SOLN
0.9 | INTRAMUSCULAR | Status: AC
Start: 2016-12-06 — End: 2016-12-06

## 2016-12-06 MED ORDER — ACETAMINOPHEN 10 MG/ML IV SOLN
10 MG/ML | INTRAVENOUS | Status: AC
Start: 2016-12-06 — End: 2016-12-06
  Administered 2016-12-06: 17:00:00 1000 via INTRAVENOUS

## 2016-12-06 MED ORDER — METFORMIN HCL 500 MG PO TABS
500 MG | Freq: Two times a day (BID) | ORAL | Status: DC
Start: 2016-12-06 — End: 2016-12-07
  Administered 2016-12-06 – 2016-12-07 (×2): 500 mg via ORAL

## 2016-12-06 MED ORDER — GLUCOSE 40 % PO GEL
40 % | ORAL | Status: DC | PRN
Start: 2016-12-06 — End: 2016-12-07

## 2016-12-06 MED ORDER — FENTANYL 50 MCG/HR TD PT72
50 MCG/HR | TRANSDERMAL | Status: DC
Start: 2016-12-06 — End: 2016-12-07
  Administered 2016-12-06: 22:00:00 1 via TRANSDERMAL

## 2016-12-06 MED ORDER — DEXTROSE 5 % IV SOLN
5 | ORAL_TABLET | INTRAVENOUS | 0 refills | Status: DC | PRN
Start: 2016-12-06 — End: 2016-12-07

## 2016-12-06 MED ORDER — LACTATED RINGERS IV SOLN
INTRAVENOUS | Status: DC
Start: 2016-12-06 — End: 2016-12-07
  Administered 2016-12-06: 23:00:00 via INTRAVENOUS

## 2016-12-06 MED ORDER — OXYCODONE-ACETAMINOPHEN 5-325 MG PO TABS
5-325 MG | ORAL | Status: DC | PRN
Start: 2016-12-06 — End: 2016-12-07

## 2016-12-06 MED ORDER — FINASTERIDE 5 MG PO TABS
5 MG | Freq: Every day | ORAL | Status: DC
Start: 2016-12-06 — End: 2016-12-07
  Administered 2016-12-07: 14:00:00 5 mg via ORAL

## 2016-12-06 MED ORDER — MORPHINE SULFATE 2 MG/ML IJ SOLN
2 | INTRAMUSCULAR | Status: DC | PRN
Start: 2016-12-06 — End: 2016-12-07

## 2016-12-06 MED ORDER — GLUCAGON HCL RDNA (DIAGNOSTIC) 1 MG IJ SOLR
1 MG | INTRAMUSCULAR | Status: DC | PRN
Start: 2016-12-06 — End: 2016-12-07

## 2016-12-06 MED ORDER — ZOLPIDEM TARTRATE 5 MG PO TABS
5 MG | Freq: Every evening | ORAL | Status: DC
Start: 2016-12-06 — End: 2016-12-07
  Administered 2016-12-07: 03:00:00 5 mg via ORAL

## 2016-12-06 MED ORDER — PREGABALIN 25 MG PO CAPS
25 MG | Freq: Two times a day (BID) | ORAL | Status: DC
Start: 2016-12-06 — End: 2016-12-07
  Administered 2016-12-07 (×2): 50 mg via ORAL

## 2016-12-06 MED ORDER — LIDOCAINE HCL (PF) 1 % IJ SOLN
1 % | Freq: Once | INTRAMUSCULAR | Status: DC | PRN
Start: 2016-12-06 — End: 2016-12-06

## 2016-12-06 MED ORDER — PANTOPRAZOLE SODIUM 40 MG PO TBEC
40 MG | Freq: Every day | ORAL | Status: DC
Start: 2016-12-06 — End: 2016-12-07
  Administered 2016-12-07: 14:00:00 40 mg via ORAL

## 2016-12-06 MED ORDER — METOPROLOL SUCCINATE ER 25 MG PO TB24
25 MG | Freq: Every day | ORAL | Status: DC
Start: 2016-12-06 — End: 2016-12-07
  Administered 2016-12-06 – 2016-12-07 (×2): 25 mg via ORAL

## 2016-12-06 MED FILL — OFIRMEV 10 MG/ML IV SOLN: 10 MG/ML | INTRAVENOUS | Qty: 100

## 2016-12-06 MED FILL — HUMALOG 100 UNIT/ML SC SOLN: 100 UNIT/ML | SUBCUTANEOUS | Qty: 3

## 2016-12-06 MED FILL — TAMSULOSIN HCL 0.4 MG PO CAPS: 0.4 MG | ORAL | Qty: 1

## 2016-12-06 MED FILL — TOPROL XL 25 MG PO TB24: 25 MG | ORAL | Qty: 1

## 2016-12-06 MED FILL — LACTATED RINGERS IV SOLN: INTRAVENOUS | Qty: 1000

## 2016-12-06 MED FILL — SENSORCAINE-MPF/EPINEPHRINE 0.25% -1:200000 IJ SOLN: INTRAMUSCULAR | Qty: 30

## 2016-12-06 MED FILL — GNP ASPIRIN 81 MG PO TBEC: 81 MG | ORAL | Qty: 1

## 2016-12-06 MED FILL — CEFAZOLIN SODIUM 1 G IJ SOLR: 1 g | INTRAMUSCULAR | Qty: 1000

## 2016-12-06 MED FILL — PROSCAR 5 MG PO TABS: 5 MG | ORAL | Qty: 1

## 2016-12-06 MED FILL — LISINOPRIL 5 MG PO TABS: 5 MG | ORAL | Qty: 1

## 2016-12-06 MED FILL — FENTANYL 50 MCG/HR TD PT72: 50 MCG/HR | TRANSDERMAL | Qty: 1

## 2016-12-06 MED FILL — SODIUM CHLORIDE 0.9 % IJ SOLN: 0.9 % | INTRAMUSCULAR | Qty: 20

## 2016-12-06 MED FILL — LEXAPRO 10 MG PO TABS: 10 MG | ORAL | Qty: 2

## 2016-12-06 MED FILL — METFORMIN HCL 500 MG PO TABS: 500 MG | ORAL | Qty: 1

## 2016-12-06 NOTE — Anesthesia Pre-Procedure Evaluation (Signed)
Department of Anesthesiology  Preprocedure Note       Name:  Jordan Gutierrez   Age:  82 y.o.  DOB:  April 09, 1929                                          MRN:  [id number]         Date:  12/06/2016      Surgeon:    Procedure:    Medications prior to admission:   Prior to Admission medications    Medication Sig Start Date End Date Taking? Authorizing Provider   HYDROcodone-acetaminophen (NORCO) 7.5-325 MG per tablet Take 1 tablet by mouth 4 times daily  Dx: M51.36, G89.29. Earliest Fill Date: 02/01/17 02/01/17   Wonda CeriseMegan Marie Stanfield, PA-C   fentaNYL (DURAGESIC) 50 MCG/HR Place 1 patch onto the skin every 72 hours . Earliest Fill Date: 12/05/16 12/05/16 01/04/17  Wonda CeriseMegan Marie Stanfield, PA-C   fentaNYL (DURAGESIC) 50 MCG/HR Place 1 patch onto the skin every 72 hours . Earliest Fill Date: 01/03/17 01/03/17 02/02/17  Wonda CeriseMegan Marie Stanfield, PA-C   fentaNYL (DURAGESIC) 50 MCG/HR Place 1 patch onto the skin every 72 hours . Earliest Fill Date: 02/01/17 02/01/17 03/03/17  Wonda CeriseMegan Marie Stanfield, PA-C   HYDROcodone-acetaminophen [facility](NORCO) 7.5-325 MG per tablet 1 po qid prn. Earliest Fill Date: 01/03/17 01/03/17   Wonda CeriseMegan Marie Stanfield, PA-C   HYDROcodone-acetaminophen [facility](NORCO) 7.5-325 MG per tablet 1 po qid prn. Earliest Fill Date: 12/05/16 12/05/16   Wonda CeriseMegan Marie Stanfield, PA-C   cyanocobalamin 1000 MCG/ML injection every 14 days  07/29/16   Historical Provider, MD   escitalopram (LEXAPRO) 20 MG tablet 20 mg daily  05/08/16   Historical Provider, MD   NEXIUM 40 MG delayed release capsule every morning (before breakfast)  04/29/16   Historical Provider, MD   lisinopril (PRINIVIL;ZESTRIL) 5 MG tablet Take 5 mg by mouth daily  05/10/16   Historical Provider, MD   TOPROL XL 25 MG extended release tablet Take 25 mg by mouth daily  05/10/16   Historical Provider, MD   tamsulosin (FLOMAX) 0.4 MG capsule Take 0.4 mg by mouth daily  05/28/16   Historical Provider, MD   aspirin 81 MG tablet Take 81 mg by mouth daily    Historical Provider, MD   pregabalin (LYRICA) 50 MG  capsule Take 50 mg by mouth 2 times daily.    Historical Provider, MD   finasteride (PROSCAR) 5 MG tablet Take 5 mg by mouth daily.    Historical Provider, MD   METFORMIN HCL PO Take  by mouth. TAKE 2 TABLETS TWICE A DAY     Historical Provider, MD   ZOLPIDEM TARTRATE PO Take  by mouth nightly.      Historical Provider, MD       Current medications:    Current Outpatient Prescriptions   Medication Sig Dispense Refill   ??? [START ON 02/01/2017] HYDROcodone-acetaminophen (NORCO) 7.5-325 MG per tablet Take 1 tablet by mouth 4 times daily  Dx: M51.36, G89.29. Earliest Fill Date: 02/01/17 120 tablet 0   ??? fentaNYL (DURAGESIC) 50 MCG/HR Place 1 patch onto the skin every 72 hours . Earliest Fill Date: 12/05/16 10 patch 0   ??? [START ON 01/03/2017] fentaNYL (DURAGESIC) 50 MCG/HR Place 1 patch onto the skin every 72 hours . Earliest Fill Date: 01/03/17 10 patch 0   ??? [START ON 02/01/2017] fentaNYL (DURAGESIC) 50 MCG/HR  Place 1 patch onto the skin every 72 hours . Earliest Fill Date: 02/01/17 10 patch 0   ??? [START ON 01/03/2017] HYDROcodone-acetaminophen (NORCO) 7.5-325 MG per tablet 1 po qid prn. Earliest Fill Date: 01/03/17 120 tablet 0   ??? HYDROcodone-acetaminophen (NORCO) 7.5-325 MG per tablet 1 po qid prn. Earliest Fill Date: 12/05/16 120 tablet 0   ??? cyanocobalamin 1000 MCG/ML injection every 14 days      ??? escitalopram (LEXAPRO) 20 MG tablet 20 mg daily      ??? NEXIUM 40 MG delayed release capsule every morning (before breakfast)      ??? lisinopril (PRINIVIL;ZESTRIL) 5 MG tablet Take 5 mg by mouth daily      ??? TOPROL XL 25 MG extended release tablet Take 25 mg by mouth daily      ??? tamsulosin (FLOMAX) 0.4 MG capsule Take 0.4 mg by mouth daily      ??? aspirin 81 MG tablet Take 81 mg by mouth daily     ??? pregabalin (LYRICA) 50 MG capsule Take 50 mg by mouth 2 times daily.     ??? finasteride (PROSCAR) 5 MG tablet Take 5 mg by mouth daily.     ??? METFORMIN HCL PO Take  by mouth. TAKE 2 TABLETS TWICE A DAY      ??? ZOLPIDEM TARTRATE PO Take  by mouth  nightly.         No current facility-administered medications for this encounter.        Allergies:  No Known Allergies    Problem List:    Patient Active Problem List   Diagnosis Code   ??? Shoulder pain, right M25.511   ??? Shoulder impingement M75.40   ??? Shoulder arthritis M19.019   ??? Knee osteoarthritis M17.10   ??? Knee pain M25.569   ??? Primary localized osteoarthrosis, lower leg M17.10   ??? Acute left-sided low back pain with left-sided sciatica M54.42   ??? Chronic pain G89.29       Past Medical History:        Diagnosis Date   ??? Acid reflux    ??? CAD (coronary artery disease)    ??? Cancer (HCC)     PROSTATE CANCER   ??? Chronic pain     neck, shoulder, and back   ??? Diabetes mellitus (HCC)    ??? HOH (hard of hearing)     hearing imparied   ??? Hypertension    ??? Pancreatitis 10/15/11       Past Surgical History:        Procedure Laterality Date   ??? CARDIAC SURGERY  2012    CABG   ??? CORONARY ANGIOPLASTY WITH STENT PLACEMENT  2012   ??? MANDIBLE FRACTURE SURGERY  10/15/11   ??? PATELLA FRACTURE SURGERY  10/15/11   ??? PROSTATE SURGERY      cancer   ??? ROTATOR CUFF REPAIR Bilateral        Social History:    Social History   Substance Use Topics   ??? Smoking status: Former Smoker     Types: Cigarettes     Quit date: 12/30/1958   ??? Smokeless tobacco: Never Used   ??? Alcohol use No                                Counseling given: Not Answered      Vital Signs (Current): There were no vitals filed for this visit.  BP Readings from Last 3 Encounters:   11/25/16 (!) 144/82   11/14/16 118/89   10/03/16 114/65       NPO Status:                                                                                 BMI:   Wt Readings from Last 3 Encounters:   12/02/16 180 lb (81.6 kg)   11/25/16 180 lb (81.6 kg)   11/14/16 184 lb 1.4 oz (83.5 kg)     There is no height or weight on file to calculate BMI.    Anesthesia Evaluation  Patient summary reviewed no history of anesthetic complications:   Airway: Mallampati:  II  TM distance: >3 FB   Neck ROM: full  Mouth opening: > = 3 FB Dental: normal exam         Pulmonary:Negative Pulmonary ROS                              Cardiovascular:    (+) hypertension:, CAD:, CABG/stent:,     (-) dysrhythmias                Neuro/Psych:   Negative Neuro/Psych ROS              GI/Hepatic/Renal:   (+) GERD: well controlled,      (-) liver disease and no renal disease       Endo/Other: Negative Endo/Other ROS   (+) Type II DM, , .                 Abdominal:           Vascular: negative vascular ROS.                                     Anesthesia Plan      general     ASA 3       Induction: intravenous.    MIPS: Postoperative opioids intended and Prophylactic antiemetics administered.  Anesthetic plan and risks discussed with patient and child/children.      Plan discussed with CRNA.        All questions answered and agrees with plan.        Betsy CoderOBERT STACY Katlin Bortner, MD   12/06/2016

## 2016-12-06 NOTE — Progress Notes (Signed)
Dr Sande BrothersFerree in, pt ready for surgery

## 2016-12-06 NOTE — H&P (Signed)
 I have reviewed the history and physical and examined the patient and find no relevant changes.    I have reviewed with the patient and/or family the risks, benefits, and alternatives to the procedure.    Alvy Alsop Grier Mitts, MD  12/06/2016 No intake/output data recorded.

## 2016-12-06 NOTE — Plan of Care (Signed)
Problem: Falls - Risk of  Goal: Absence of falls  Outcome: Ongoing  Pt resting in bed. Bed wheels locked, bed alarm on, bed in lowest position. Non skid socks on. Call light within reach. Pt instructed on use of call light. Pt verbalized understanding. Will continue to monitor.   ??

## 2016-12-06 NOTE — Discharge Instructions (Signed)
[facility]    Dr. Bret A. Ferree    Post Operative Lumbar Spine Surgery Instructions    Patients with leg pain before surgery generally note marked improvement in their leg symptoms soon after surgery. However it may take weeks for complete relief of your leg pain. Many patients note an increase in their leg symptoms 2 to 4 days after surgery. Such pain is usually related to inflammation and improves with time. Your leg numbness may be a little worse following surgery and should improve with time.  Please call our office even at night if you began to experience pain, numbness or weakness in your previously asymptomatic leg, numbness in your groin or saddle area, difficulty controlling your bladder or bowels, or severe pain, increasing numbness or increasing weakness of your previously symptomatic leg.      Incision/Showering:  You may remove your dressing 3 to 4 days after your surgery. The small pieces of tape over your incision will fall off several days after surgery, please do not remove them. You may shower with the dressing on the first 3 to 4 days after surgery and without a dressing thereafter. Keep your incision clean and dry, other than when you are showering. Please avoid bathing in a tub until discussed with the doctor.  Look at your wound daily. Call the office if you notice a foul odor, drainage, or the skin is red around your incision. Call for fevers over 101.0 degrees or go to the emergency room for evaluation. While it is rare, you may be developing an infection.    Activity/Driving:  Walking is encouraged as a daily routine. Walk as far as you like.  This builds and maintains muscles. Avoid twisting, bending and most importantly, lifting over 10 pounds.  Steps are permissible.  You may begin driving when you feel you can safely do so. Remember that while you are taking narcotic medications you cannot react or move as quickly and it may be unsafe to drive.    Diet/Bowel routine:  Eat three healthy  meals a day to assist your body to heal well.  Constipation is common after surgery and especially while on pain medications. Apple or prune juice may help with this problem. You may also try any over the counter laxative you feel may be helpful.  Drinking more water is another way to keep your bowels moving.    Braces:  You may have been given a back brace at the hospital.  If you have received one,  then you should be wearing it at all times when in an upright position. It can be removed for bathing or sleep.      Medications:  You can resume your usual home medicines after you are discharged from the hospital.  Additionally, pain medications or anti-inflammatory medications may have been prescribed. Please take the anti-inflammatory medication even if you do not take the pain medication. The anti-inflammatory and pain medications may be taken together. Please do not take the anti-inflammatory medication if you have a history of ulcers or gastritis. Please stop the anti-inflammatory medication if it upsets your stomach or you notice blood in you stools or black stools.  These medicines have been given to you to assist in relief of pain related to your surgery.  It will be helpful to you to keep your pain under control in order to assist you in completing the recommended daily walks.    Returning to work:  Some patients can return to a   sedentary job in 1-3 days. Heavy  lifting jobs require more time off of work. Please discuss this with the doctor on your first postoperative office visit.  Healing may take up to 3 or more  months after a major spine surgery.    Diabetics:  Sometimes blood sugars are elevated after a surgery and may take up to a week to return to your usual levels.  As well, if you have been prescribed a Medrol Steroid Pack, your sugars are likely to become somewhat elevated for a short period of time.  Please monitor your blood sugars daily and call your primary doctor if the levels become higher  than 200.    Follow-up Appointment:  You should contact the office for an appointment with the doctor or his assistant 2-3 weeks after surgery.      Dr. Bret A. Ferree       [phone]

## 2016-12-06 NOTE — Progress Notes (Signed)
Pt arrived from OR to PACU, immediately placed on bedside monitor. Report received from OR RN and Anesthesia Provider. Will continue to monitor.

## 2016-12-06 NOTE — Progress Notes (Signed)
Report given to Abby RN

## 2016-12-06 NOTE — Progress Notes (Signed)
Gave report to Kristina RN. Four eyed skin assessment done.

## 2016-12-06 NOTE — Op Note (Signed)
 [facility] Dareen Piano  12/06/2016 1:43 PM    PATIENT NAME:          Jordan Gutierrez  DATE OF BIRTH:         [birth year]   MEDICAL RECORD NUMBER         [id number]  SURGERY DATE:         12/06/2016  SURGEON:                  Curl A Griffyn Kucinski   Assistant Jolene Provost PAC      OPERATIVE REPORT     PREOPERATIVE DIAGNOSIS: Severe spinal stenosis L2-3    POSTOPERATIVE DIAGNOSIS: same     PROCEDURES PERFORMED:   1. Bilateral L2-3 hemilaminotomy, partial facetectomy and foraminotomy   2. Microdissection using the operating room microscope.     ANESTHESIA: General     ESTIMATED BLOOD LOSS: 50cc     INDICATIONS FOR SURGERY: Mr. Helget is a 82 y.o. male with back and left anterior thigh pain. The symptoms failed to respond to conservative intervention. An MRI scan was performed and this showed evidence of severe spinal stenosis L2-3 and moderate stenosis L4-5. Having failed conservative management and experiencing persistent symptoms, the patient elected to proceed ahead with the surgical option listed above.     DETAILS OF PROCEDURE: The patient was brought to the operating room and placed under general anesthesia. The patient was then placed prone on a Barclay table. All bony prominences were inspected and padded prior to sterile draping. Using a #10 blade knife, the skin was incised in the midline and monopolar cautery was used to dissect through the subcutaneous tissue to open the fascia and reflect the paraspinal muscles laterally exposing the L2-3 interspace on the left side. The microscope was then brought into the field and used to assist with performing a microsurgical hemilaminotomy, partial facetectomy and foraminotomy. Using the Midas Rex drill, I burred down the hemilamina of L2 and the medial portion of the facet joint. The underlying ligamentum flavum was freed with the micronerve hook from the underlying dura and removed with the 3 mm punch laterally, exposing the lateral dural tube and takeoff of the L3 nerve root. I  carefully dissected the ligamentum flavum from the dura using a micronerve hook and removed it. I then performed a foraminotomy with a 3 mm punch. The L3 nerve root was adherent to a protruding disc. I freed the nerve root from the disc and retracted it medially. I made a small incision in the annulus and removed a small amount of disc tissue. The L3 nerve root and the thecal sac were identified and noted to be without dura tears. I then made an incision in the fascia to the right of the spinous process. I then performed a right hemilaminotomy, partial facetectomy and foraminotomy at the L2-3 using the previously described method. The wound was copiously irrigated with antibiotic solution. 0.5% Marcaine in subcutaneous tissues for analgesia. The fascia was then reapproximated using interrupted 0 Vicryl sutures and interrupted 3-0 Vicryl sutures followed by a 4-0 Vicryl subcuticular suture were used to reapproximate the subcuticular layer. A sterile dressing was then applied. The patient was extubated in the operating room and transferred to the recovery room in stable condition. There were no complications.   Raelynn Corron A. Sande Brothers, M.D.

## 2016-12-06 NOTE — Progress Notes (Signed)
Upon assessment, pt's tegaderm dressing noted to be pealing from the lower half up. Dressing reinforced with 2x2 gauze and tegaderm. Will continue to monitor.

## 2016-12-06 NOTE — Other (Signed)
Patient Acct Nbr:  [id number]  Primary AUTH/CERT:    Primary Insurance Company Name:   MEDICARE  Primary Insurance Plan Name:  MEDICARE INPT/OUTPT  Primary Insurance Group Number:    Primary Insurance Plan Type: [facility]  Primary Insurance Policy Number:  [id number]    Secondary AUTH/CERT:    Secondary Insurance Company Name:   [employer]  Secondary Insurance Plan Name:  TRICARE M/CARE COMP  Secondary Insurance Group Number:    Secondary Insurance Plan Type: S  Secondary Insurance Policy Number:  [id number]

## 2016-12-06 NOTE — Progress Notes (Signed)
12/06/16 1620   Treatment   Treatment Type IS   SpO2 93 %   Incentive Spirometry Tx   Treatment Tolerance Well   Incentive Spirometry Goal (mL) 1095 mL   Incentive Spirometry Achieved (mL) 1000 mL   Cough/Sputum   Cough Non-productive     Patient is able to do on own

## 2016-12-06 NOTE — Anesthesia Post-Procedure Evaluation (Signed)
Postoperative Anesthesia Note    Name:    Jordan Gutierrez  MRN:      [id number]    Patient Vitals for the past 12 hrs:   BP Temp Temp src Pulse Resp SpO2 Height Weight   12/06/16 1545 121/70 97.2 ??F (36.2 ??C) Oral 91 18 96 % - -   12/06/16 1500 115/62 - - 93 20 91 % - -   12/06/16 1455 (!) 118/57 - - 92 23 93 % - -   12/06/16 1445 (!) 104/90 - - 89 20 92 % - -   12/06/16 1440 118/60 - - 92 20 96 % - -   12/06/16 1435 - - - 92 - - - -   12/06/16 1430 (!) 118/57 - - 92 18 93 % - -   12/06/16 1425 (!) 118/52 - - 91 21 93 % - -   12/06/16 1420 (!) 129/53 - - 92 23 95 % - -   12/06/16 1417 (!) 116/58 - - 92 17 93 % - -   12/06/16 1415 - - - 92 - - - -   12/06/16 1412 (!) 118/56 - - 91 20 95 % - -   12/06/16 1407 (!) 116/59 - - 91 22 95 % - -   12/06/16 1402 (!) 112/57 - - 95 23 90 % - -   12/06/16 1357 (!) 103/49 98.1 ??F (36.7 ??C) Temporal 92 21 92 % - -   12/06/16 1355 - - - 93 - - - -   12/06/16 1130 131/69 97.7 ??F (36.5 ??C) Temporal 92 18 100 % 5\' 10"  (1.778 m) 190 lb (86.2 kg)        LABS:    CBC  No results found for: WBC, HGB, HCT, PLT  RENAL  No results found for: NA, K, CL, CO2, BUN, CREATININE, GLUCOSE  COAGS  No results found for: PROTIME, INR, APTT    Intake & Output:  In: 1200 [I.V.:1200]  Out: 50     Nausea & Vomiting:  No    Level of Consciousness:  Awake    Pain Assessment:  Adequate analgesia    Anesthesia Complications:  No apparent anesthetic complications    SUMMARY      Vital signs stable  OK to discharge from Stage I post anesthesia care.  Care transferred from Anesthesiology department on discharge from perioperative area

## 2016-12-07 LAB — POCT GLUCOSE
POC Glucose: 310 mg/dl — ABNORMAL HIGH (ref 70–99)
POC Glucose: 157 mg/dl — ABNORMAL HIGH (ref 70–99)
POC Glucose: 138 mg/dl — ABNORMAL HIGH (ref 70–99)

## 2016-12-07 MED FILL — LYRICA 25 MG PO CAPS: 25 MG | ORAL | Qty: 2

## 2016-12-07 MED FILL — DOK 100 MG PO CAPS: 100 MG | ORAL | Qty: 1

## 2016-12-07 MED FILL — ZOLPIDEM TARTRATE 5 MG PO TABS: 5 MG | ORAL | Qty: 1

## 2016-12-07 MED FILL — LISINOPRIL 5 MG PO TABS: 5 MG | ORAL | Qty: 1

## 2016-12-07 MED FILL — LEXAPRO 10 MG PO TABS: 10 MG | ORAL | Qty: 2

## 2016-12-07 MED FILL — METFORMIN HCL 500 MG PO TABS: 500 MG | ORAL | Qty: 1

## 2016-12-07 MED FILL — GNP ASPIRIN 81 MG PO TBEC: 81 MG | ORAL | Qty: 1

## 2016-12-07 MED FILL — MONOJECT FLUSH SYRINGE 0.9 % IV SOLN: 0.9 % | INTRAVENOUS | Qty: 10

## 2016-12-07 MED FILL — PROSCAR 5 MG PO TABS: 5 MG | ORAL | Qty: 1

## 2016-12-07 MED FILL — TOPROL XL 25 MG PO TB24: 25 MG | ORAL | Qty: 1

## 2016-12-07 MED FILL — TAMSULOSIN HCL 0.4 MG PO CAPS: 0.4 MG | ORAL | Qty: 1

## 2016-12-07 MED FILL — PANTOPRAZOLE SODIUM 40 MG PO TBEC: 40 MG | ORAL | Qty: 1

## 2016-12-07 NOTE — Progress Notes (Signed)
Occupational Therapy   Occupational Therapy Initial Assessment, Treatment, and Discharge  Date: 12/07/2016   Patient Name: Jordan Gutierrez  MRN: [id number]     DOB: [birth year]    Patient Diagnosis(es): The encounter diagnosis was Pain.     has a past medical history of Acid reflux; CAD (coronary artery disease); Cancer ([place]); Chronic pain; Diabetes mellitus ([place]); HOH (hard of hearing); Hypertension; and Pancreatitis.   has a past surgical history that includes Patella fracture surgery (10/15/11); Mandible fracture surgery (10/15/11); Coronary angioplasty with stent (2012); Cardiac surgery (2012); ostate surgery; Rotator cuff repair (Bilateral); and other surgical history (12/06/2016).     Restrictions  Restrictions/Precautions  Restrictions/Precautions: General Precautions, Fall Risk  Position Activity Restriction  Spinal Precautions: No Bending, No Lifting, No Twisting  Other position/activity restrictions: Ax as tolerated    Subjective   General  Chart Reviewed: Yes  Patient assessed for rehabilitation services?: Yes  Family / Caregiver Present: No  Referring Practitioner: Lavona Mound, PA  Diagnosis: Severe spinal stenosis L2-L3 s/p bilateral L2-3 hemilaminotomy, partial facetectomy and foraminotomy on 12/06/16.  Subjective  Subjective: RN cleared pt for tx. Pt walking in room at session start.  Pain Assessment  Patient Currently in Pain: Yes  Pain Assessment: 0-10  Pain Level: 2  Pain Type: Acute pain, Surgical pain  Pain Location: Back  Pain Orientation: Lower  Pain Intervention(s): Repositioned, Emotional support, RN notified, Rest  Response to Pain Intervention: Patient Satisfied     Social/Functional History  Social/Functional History  Lives With: Alone  Type of Home: House  Home Layout: One level  Home Access: Level entry  Bathroom Shower/Tub: Tourist information centre manager: Handicap height (with handles)  Bathroom Equipment: Grab bars around toilet, Grab bars in shower  Home Equipment: Cane,  [employer], Standard walker, Rolling walker (transport chair)  ADL Assistance: Independent  Homemaking Assistance: Needs assistance (Indep except for cleaning service)  Ambulation Assistance: Independent  Transfer Assistance: Independent  Additional Comments: Son lives next door and checks on patient every day.     Objective   Vision: Impaired  Vision Exceptions: Wears glasses at all times  Hearing: Exceptions to [facility]  Hearing Exceptions: Hard of hearing/hearing concerns      Orientation  Overall Orientation Status: Within Functional Limits     Balance  Sitting Balance: Supervision  Standing Balance: Stand by assistance (no device)    Functional Mobility Comments: Pt walked ~60f to perform ADL with gait belt and SBA.    TEcologist- Technique: Ambulating  Equipment Used: SManufacturing systems engineer Supervision    ADL  Feeding: Independent (to drink sitting)  Grooming: Stand by assistance (to wash hands standing)  LE Dressing: Supervision (to don/doff B socks, to don shoes, and to don jeans seated and standing)     Tone RUE  RUE Tone: Normotonic  Tone LUE  LUE Tone: Normotonic  Coordination  Movements Are Fluid And Coordinated: Yes     Bed mobility  Supine to Sit: Independent (HOB flat, no rail, log roll after education)  Sit to Supine: Independent (HOB flat, no rail, log roll after education)  Scooting: Independent     Transfers  Stand to sit: Supervision     Cognition  Overall Cognitive Status: WFL     LUE AROM (degrees)  LUE AROM : WFL (based on functional presentation, not formally tested 2/2 back precautions)  RUE AROM (degrees)  RUE AROM : WFL (based on functional presentation, not formally tested 2/2  back precautions)     LUE Strength  Gross LUE Strength: WFL (based on functional presentation, not formally tested 2/2 back precautions)  RUE Strength  Gross RUE Strength: WFL (based on functional presentation, not formally tested 2/2 back precautions)      Education: Role of OT, safe  t/f training, safe use of DME, awareness of deficits, discharge planning, ADL as therapeutic exercise, importance of OOB, back precautions with implications for ADL and functional mobility/transfers    Assessment    After evaluation and treatment, pt is presenting with the ability to perform all ADL and functional mobility at the supervision level or higher. Pt with no further acute OT or equipment need, thus d/c pt. Rec d/c home with family assist prn.  Prognosis: Good  Decision Making: Low Complexity  Discharge Recommendations: Home with assist PRN (Initially 24hr)  No Skilled OT: No OT goals identified  REQUIRES OT FOLLOW UP: No  Activity Tolerance  Activity Tolerance: Patient Tolerated treatment well  Safety Devices  Safety Devices in place: Yes  Type of devices: Call light within reach;Gait belt;Left in bed;Nurse notified  OT Equipment Recommendations  Equipment Needed: No        Discharge Recommendations: Home with assist PRN (Initially 24hr)     Plan   Plan  Times per week: Eval, tx, and d/c    G-Code  OT G-codes  Functional Assessment Tool Used: AM-PAC  Score: 24  Functional Limitation: Self care  Self Care Current Status (W9604): 0 percent impaired, limited or restricted  Self Care Goal Status (V4098): 0 percent impaired, limited or restricted  Self Care Discharge Status (J1914): 0 percent impaired, limited or restricted    AM-PAC Score   AM-PAC Inpatient Daily Activity Raw Score: 24  AM-PAC Inpatient ADL T-Scale Score : 57.54  ADL Inpatient CMS 0-100% Score: 0  ADL Inpatient CMS G-Code Modifier : CH    Goals  Short term goals  Time Frame for Short term goals: 1 week  Short term goal 1: Pt will complete all ADL at the supervision level or higher. GOAL MET  Short term goal 2: Pt will verbalize and demonstrate understanding of back precautions. GOAL MET.  Patient Goals   Patient goals : "I want to go home."     Therapy Time   Individual Concurrent Group Co-treatment   Time In 7829         Time Out 1200          Minutes 25         Timed Code Treatment Minutes: [address], [place] #[id number]

## 2016-12-07 NOTE — Plan of Care (Signed)
Problem: Falls - Risk of  Goal: Absence of falls  Outcome: Ongoing  Bed in lowest position. Wheels locked. Bed check in place. Patient instructed to call before getting up. Will continue to monitor.

## 2016-12-07 NOTE — Plan of Care (Signed)
Problem: Falls - Risk of  Goal: Absence of falls  Outcome: Ongoing  Pt resting in bed quietly. Bed in lowest position, wheels locked, side rails up X2, non skid socks on. Bed check alarm engaged. Pt instructed not to get out of bed on own, to use call light for staff assistance when ambulating or other needs. Pt verbalizes understanding. Call light within reach. Will continue to monitor.       Problem: Pain:  Goal: Control of acute pain  Control of acute pain  Outcome: Ongoing  Pt rates pain level using 0-10 pain scale. Pain meds administered as ordered per MAR. Pt instructed to use call light for increasing pain or ineffective pain management. Pt verbalizes understanding. Call light within reach. Will continue to monitor.

## 2016-12-07 NOTE — Discharge Summary (Signed)
 Date of admission:12/06/16    Date of discharge: 12/07/16    Discharge diagnosis: Severe stenosis L2-3    Procedure: Microlumbar laminotomy, partial facetectomy, L2-3     Consultations: none    Condition good    F/U: with me in two weeks    D/C meds: Percocet 5/325 1-2  po q4-6 hours prn, colace 100mg  po bid    Resume preop medications    D/C instructions: given to patient    Hospital course: Patient awakened from anesthesia noting marked relief of his LE and groin pain. He was D/C'd to home the day after surgery. He was ambulating, voiding and taking pos well before D/C. He was AF and his dressing was dry. He had 5/5 strength of his EHL, and ankle DF/PF. His sensation was intact L3 to S1.

## 2016-12-09 MED FILL — SODIUM CHLORIDE 0.9 % IJ SOLN: 0.9 % | INTRAMUSCULAR | Qty: 40

## 2016-12-09 MED FILL — ONDANSETRON HCL 4 MG/2ML IJ SOLN: 4 MG/2ML | INTRAMUSCULAR | Qty: 2

## 2016-12-09 MED FILL — PHENYLEPHRINE HCL 10 MG/ML IJ SOLN: 10 MG/ML | INTRAMUSCULAR | Qty: 1

## 2016-12-09 MED FILL — EPINEPHRINE PF 1 MG/ML IJ SOLN: 1 MG/ML | INTRAMUSCULAR | Qty: 1

## 2016-12-09 MED FILL — DIPRIVAN 200 MG/20ML IV EMUL: 200 MG/20ML | INTRAVENOUS | Qty: 20

## 2016-12-09 MED FILL — FENTANYL CITRATE (PF) 100 MCG/2ML IJ SOLN: 100 MCG/2ML | INTRAMUSCULAR | Qty: 2

## 2016-12-09 MED FILL — ROCURONIUM BROMIDE 50 MG/5ML IV SOLN: 50 MG/5ML | INTRAVENOUS | Qty: 5

## 2016-12-09 MED FILL — DEXAMETHASONE SODIUM PHOSPHATE 4 MG/ML IJ SOLN: 4 MG/ML | INTRAMUSCULAR | Qty: 1

## 2016-12-09 MED FILL — HYDROMORPHONE HCL 2 MG/ML IJ SOLN: 2 MG/ML | INTRAMUSCULAR | Qty: 1

## 2016-12-09 MED FILL — EPHEDRINE SULFATE 50 MG/ML IJ SOLN: 50 MG/ML | INTRAMUSCULAR | Qty: 1

## 2016-12-09 MED FILL — BRIDION 200 MG/2ML IV SOLN: 200 MG/2ML | INTRAVENOUS | Qty: 2

## 2016-12-09 MED FILL — LIDOCAINE HCL 2 % IJ SOLN: 2 % | INTRAMUSCULAR | Qty: 20

## 2016-12-11 NOTE — Telephone Encounter (Signed)
Spoke with patient and he states the first two days were rough but feeling much better.

## 2016-12-19 ENCOUNTER — Encounter: Attending: Orthopaedic Surgery of the Spine | Primary: Internal Medicine

## 2016-12-26 ENCOUNTER — Ambulatory Visit: Admit: 2016-12-26 | Discharge: 2016-12-26 | Payer: MEDICARE | Attending: Surgical | Primary: Internal Medicine

## 2016-12-26 DIAGNOSIS — M48061 Spinal stenosis, lumbar region without neurogenic claudication: Secondary | ICD-10-CM

## 2016-12-26 MED ORDER — HYDROCODONE-ACETAMINOPHEN 5-325 MG PO TABS
5-325 MG | ORAL_TABLET | Freq: Three times a day (TID) | ORAL | 0 refills | Status: DC | PRN
Start: 2016-12-26 — End: 2017-03-27

## 2016-12-26 NOTE — Progress Notes (Signed)
[name] returns today 2 weeks s/p MLD. He reports marked improvement of his back and leg symptoms.     His incisions show no signs of infection.  He has a normal gait and 5/5 strength of his ankle DFs/PFs, bilaterally.  His sensation is intact from L3 to S1 bilaterally.    I cautioned him to avoid lifting more than 15 pounds, bending and impact type aerobic exercise for 8 week after surgery, then slowly increase his activity over the next month.

## 2017-02-06 ENCOUNTER — Ambulatory Visit
Admit: 2017-02-06 | Discharge: 2017-02-06 | Payer: MEDICARE | Attending: Physical Medicine & Rehabilitation | Primary: Internal Medicine

## 2017-02-06 DIAGNOSIS — M48062 Spinal stenosis, lumbar region with neurogenic claudication: Secondary | ICD-10-CM

## 2017-02-06 MED ORDER — FENTANYL 50 MCG/HR TD PT72
50 MCG/HR | MEDICATED_PATCH | TRANSDERMAL | 0 refills | Status: DC
Start: 2017-02-06 — End: 2017-02-06

## 2017-02-06 MED ORDER — FENTANYL 25 MCG/HR TD PT72
25 MCG/HR | MEDICATED_PATCH | TRANSDERMAL | 0 refills | Status: AC
Start: 2017-02-06 — End: 2017-02-26

## 2017-02-06 MED ORDER — HYDROCODONE-ACETAMINOPHEN 7.5-325 MG PO TABS
ORAL_TABLET | ORAL | 0 refills | Status: DC
Start: 2017-02-06 — End: 2017-03-27

## 2017-02-06 MED ORDER — HYDROCODONE-ACETAMINOPHEN 7.5-325 MG PO TABS
ORAL_TABLET | ORAL | 0 refills | Status: AC
Start: 2017-02-06 — End: 2017-03-27

## 2017-02-06 MED ORDER — HYDROCODONE-ACETAMINOPHEN 7.5-325 MG PO TABS
ORAL_TABLET | ORAL | 0 refills | Status: AC
Start: 2017-02-06 — End: 2017-02-26

## 2017-02-06 MED ORDER — FENTANYL 25 MCG/HR TD PT72
25 MCG/HR | MEDICATED_PATCH | TRANSDERMAL | 0 refills | Status: AC
Start: 2017-02-06 — End: 2017-03-26

## 2017-02-06 MED ORDER — FENTANYL 12 MCG/HR TD PT72
12 MCG/HR | MEDICATED_PATCH | TRANSDERMAL | 0 refills | Status: AC
Start: 2017-02-06 — End: 2017-03-27

## 2017-02-06 MED ORDER — FENTANYL 12 MCG/HR TD PT72
12 MCG/HR | MEDICATED_PATCH | TRANSDERMAL | 0 refills | Status: DC
Start: 2017-02-06 — End: 2017-02-06

## 2017-02-06 NOTE — Progress Notes (Signed)
 Follow up: SPINE    CHIEF COMPLAINT:    Chief Complaint   Patient presents with   ??? Neck Pain       HISTORY OF PRESENT ILLNESS:                The patient is a 83 y.o. male here to follow up medication maintenance Chronic shoulder pain chronic left referred neck pain and lumbar stenosis.  Status post decompression recently Dr. free with continued axial back pain 6 weeks ago.  He has left mechanical neck pain this been responsive to cervical epidurals in the past, the last August 2017.      Pain Assessment  Location of Pain: Neck  Severity of Pain: 6  Quality of Pain: Aching, Dull  Duration of Pain: Persistent  Frequency of Pain: Constant  Aggravating Factors: Bending, Stretching, Straightening, Exercise  Limiting Behavior: Yes  Relieving Factors: Rest  Result of Injury: No  Work-Related Injury: No  Are there other pain locations you wish to document?: No    Current/Past Treatment:   ?? Physical Therapy: Past  ?? Chiropractic:     ?? Injection:   01/03/15: Lt C7-T1 CESI  12/04/15: Lt C7-T1 CESI   08/16/16: Lt C7-T1 CESI  -B L3 TX ESI--Dr. Arlington Calix  09/10/16 Bil L4-5 TX ESI  ?? Medications:   Currently Duragesic 50 ??g q.72 hours and hydrocodone 7.5 mg q.i.d.  ?? Surgery/Consult: Lumbar decompression 6 weeks ago Dr. Adah Salvage    Function-Does the pain medication improve your ability to do:   ?? Personal care: Yes  ?? Housework: Yes   ?? Physical activity: Yes  ?? Social activity: Yes  ?? to work: NA    Pain Scale: 1-10  With Meds:   3  Pain Scale: 1-10 Without Meds: 10    Potential aberrant drug-related behavior:  ?? Aberrant behavior identified? NO  ?? Potential aberrant behavior identified? NO  ?? Reports loss are stolen prescriptions? NO  ?? Insist on certain medications by name? NO  ?? Purposeful oversedation? NO  ?? Increased dose without authorization? NO  ?? MED: Currently 165  ?? Last UDS:    Side Effects: None    Past Medical History: Medical history form was reviewd today & scanned into the Media tab  Past Medical History:    Diagnosis Date   ??? Acid reflux    ??? CAD (coronary artery disease)    ??? Cancer (HCC)     PROSTATE CANCER   ??? Chronic pain     neck, shoulder, and back   ??? Diabetes mellitus (HCC)    ??? HOH (hard of hearing)     hearing imparied   ??? Hypertension    ??? Pancreatitis 10/15/11        REVIEW OF SYSTEMS:   CONSTITUTIONAL: Denies unexplained weight loss, fevers, chills or fatigue  NEUROLOGIC: Denies tremors or seizures         PHYSICAL EXAM:    Vitals: Blood pressure (!) 156/88, pulse 85, height 5\' 10"  (1.778 m), weight 190 lb 0.6 oz (86.2 kg).    GENERAL EXAM:  ?? General Apparence: Patient is adequately groomed with no evidence of malnutrition.  ?? Orientation: The patient is oriented to time, place and person.   ?? Mood & Affect:The patient's mood and affect are appropriate  ?? Vascular: Examination reveals no swelling tenderness in upper or lower extremities.   ?? Lymphatic: The lymphatic examination bilaterally reveals all areas to be without enlargement or induration  ??  Sensation: Sensation is intact without deficit  ?? Coordination/Balance: Good coordination     CERVICAL EXAMINATION:  ?? Inspection: Local inspection shows no step-off or bruising.  Cervical alignment is normal.     ?? Palpation: No evidence of tenderness at the midline, and trapezius.  Paraspinal tenderness is present. There is no step-off or paraspinal spasm.   ?? Range of Motion: Mild to moderate loss of flexion and extension  ?? Strength: 5/5 bilateral upper extremities   ?? Special Tests:    ??   Spurling's, L'Hermitte's & Hoffman's negative bilaterally.   ??   Hawkins and Impingement tests are negative bilaterally.   ??  Cubital and Carpal tunnel Tinel's negative bilaterally.      ?? Skin:There are no rashes, ulcerations or lesions in right & left upper extremities.  ?? Reflexes: Bilaterally triceps, biceps and brachioradialis are 2+.  Clonus absent bilaterally at the feet.   ?? Additional Examinations:       ?? RIGHT UPPER EXTREMITY:  Inspection/examination of the  right upper extremity does not show any tenderness, deformity or injury. Range of motion mild to moderate loss of flexion and abduction There is no gross instability.  There are no rashes, ulcerations or lesions. Strength and tone are normal.  ?? LEFT UPPER EXTREMITY: Inspection/examination of the left upper extremity does not show any tenderness, deformity or injury. Range of motion mild to moderate loss of flexion and abduction There is no gross instability.  There are no rashes, ulcerations or lesions. Strength and tone are normal.    Diagnostic Testing:     ??UDS June 2016 positive for OxyIR as expected    His recent lumbar MRI report reviewed showing significant lumbar stenosis L2-3 and L4-5  ??  08/02/16??MRI cervical spine: ??  IMPRESSION]   ????   ????   Broad-based disc bulging/posterior osteophytic ridging with midline disc protrusions at the    C3-4 and C4-5 levels with moderate to severe narrowing of the central canal.   ????   Facet arthropathy at C5-6 and to a lesser degree C6-7 with neural foraminal narrowings as    described.           Impression:    6 weeks s/p lumbar decompression for lumbar stenosis with Dr. Adah SalvageBrett Ferree    Chronic neck and trapezial pain and cervical spondylosis    History of prostate cancer    Opioid maintenance low risk, and the ED equal 165    Plan:      : Duragesic 25 and 12 ??g patches q.72 hours with hydrocodone 7.5 q.i.d prn.= MED 133    Next month: to wean to Duragesic 25 ??g q.72 hours with hydrocodone 7.5 q4 prn=MED 105  Left C7-T1 intralaminar cervical epidural, #1 new series    2 mo Jozee Hammer/u        The risks and use of maintenance opiate medication were reviewed.  These include the risk of tolerance, addition, and abuse.  Potential side effects were also discussed.   Medications are to be taken as prescribed and not escalated without prior agreement.  OARRS/KASPER reviewed & appropriate.   Opiate medications will only be prescribed through the office of Dr Carmon GinsbergF. Donato Schultzlifford Aitanna Haubner, M. D.  unless notified otherwise         Yasmene Salomone Donato Schultzlifford Remer Couse

## 2017-02-07 NOTE — Telephone Encounter (Signed)
DOS  02/17/17  CPT   62321  [id number]  NPR  DX  [id number]  OP SX AUTH NPR FOR THIS PLAN  LEFT   LEVELS   L7 - T1   PROCEDURE   EPIDURAL INJECTION  DR. Clint GuyVALENTIN  [place] [facility]  INSURANCE: MEDICARE

## 2017-02-13 NOTE — Progress Notes (Signed)
Attempted to contact patient.  No answer.  No message left at 3:08 PM on 02/13/2017.

## 2017-02-17 ENCOUNTER — Inpatient Hospital Stay: Admit: 2017-02-17 | Attending: Physical Medicine & Rehabilitation | Primary: Internal Medicine

## 2017-02-17 LAB — POCT GLUCOSE: POC Glucose: 161 mg/dl — ABNORMAL HIGH (ref 70–99)

## 2017-02-17 MED ORDER — FENTANYL CITRATE (PF) 100 MCG/2ML IJ SOLN
100 MCG/2ML | INTRAMUSCULAR | Status: DC
Start: 2017-02-17 — End: 2017-02-17

## 2017-02-17 MED ORDER — IOHEXOL 240 MG/ML IJ SOLN
240 | INTRAMUSCULAR | Status: AC
Start: 2017-02-17 — End: 2017-02-17

## 2017-02-17 MED ORDER — DEXAMETHASONE SOD PHOSPHATE PF 10 MG/ML IJ SOLN
10 | INTRAMUSCULAR | Status: AC
Start: 2017-02-17 — End: 2017-02-17

## 2017-02-17 MED ORDER — LACTATED RINGERS IV SOLN
Freq: Once | INTRAVENOUS | Status: AC
Start: 2017-02-17 — End: 2017-02-17
  Administered 2017-02-17: 13:00:00 via INTRAVENOUS

## 2017-02-17 MED ORDER — LIDOCAINE HCL 1 % (PF) IJ SOLN 2 ML
1 % (PF) | Freq: Once | INTRAMUSCULAR | Status: AC
Start: 2017-02-17 — End: 2017-02-17
  Administered 2017-02-17: 13:00:00 0.1 mL via INTRADERMAL

## 2017-02-17 MED ORDER — SODIUM CHLORIDE 0.9 % IJ SOLN
0.9 | INTRAMUSCULAR | Status: AC
Start: 2017-02-17 — End: 2017-02-17

## 2017-02-17 MED ORDER — MIDAZOLAM HCL 2 MG/2ML IJ SOLN
2 MG/ML | INTRAMUSCULAR | Status: AC
Start: 2017-02-17 — End: 2017-02-17

## 2017-02-17 MED FILL — LIDOCAINE HCL (PF) 1 % IJ SOLN: 1 % | INTRAMUSCULAR | Qty: 2

## 2017-02-17 MED FILL — SODIUM CHLORIDE 0.9 % IJ SOLN: 0.9 % | INTRAMUSCULAR | Qty: 10

## 2017-02-17 MED FILL — DEXAMETHASONE SOD PHOSPHATE PF 10 MG/ML IJ SOLN: 10 MG/ML | INTRAMUSCULAR | Qty: 1

## 2017-02-17 MED FILL — MIDAZOLAM HCL 2 MG/2ML IJ SOLN: 2 MG/ML | INTRAMUSCULAR | Qty: 2

## 2017-02-17 MED FILL — OMNIPAQUE 240 MG/ML IJ SOLN: 240 MG/ML | INTRAMUSCULAR | Qty: 50

## 2017-02-17 MED FILL — LACTATED RINGERS IV SOLN: INTRAVENOUS | Qty: 1000

## 2017-02-17 MED FILL — FENTANYL CITRATE (PF) 100 MCG/2ML IJ SOLN: 100 MCG/2ML | INTRAMUSCULAR | Qty: 2

## 2017-02-17 NOTE — Discharge Instructions (Signed)
[facility]          [phone]  POST EPIDURAL STEROID INJECTION    PATIENT INSTRUCTIONS:  1)  DIET   .  RESUME NORMAL DIET   .  RESUME ALL PRIOR MEDICATIONS  2)  ACTIVITY  .   DO NOT STAY ALONE FOR 4-6 HOURS AFTER THE PROCEDURE  .  REST TODAY  .  DO NOT DRIVE TODAY AND 24 HOURS IF YOU RECEIVED SEDATION.  IF YOU ARE   SEEN DRIVING DURING THIS TIME THE PROPER AUTHORITIES WILL BE NOTIFIED.  .  DO NOT SIGN ANY LEGAL DOCUMENTS, MAKE ANY MAJOR DECISIONS, OR BE INVOLVED IN WORK DECISIONS FOR THE REMAINDER OF THE DAY.   .  RESUME NORMAL ACTIVITIES.  DO NOT OVER EXERT YOURSELF.  .  SHOWER OR BATHE AS NORMAL  3)  SITE CARE   .  MAY USE ICE TO SITE IF NEEDED              .  KEEP SITE DRY FOR 12 HOURS.  IF A BAND-AID WAS APPLIED TO THE                 INJECTION  SITE REMOVE BAND-AID THE FOLLOWING DAY  .  OBSERVE PUNCTURE SITE FOR SIGNS OF INFECTION (REDNESS, WARMTH, SWELLING, PURULENT DRAINAGE, INCREASED TENDERNESS OR FEVER GREATER THAN 101)  .  REPORT SIGNS OF INFECTION TO THE PHYSICIAN  4)  EXPECTED SIDE EFFECTS   .  FLUID RETENTION   .  NERVOUS ENERGY   .  SLEEPLESSNESS   .  MUSCLE SPASMS  . TEMPORARY WEAKNESS/TINGLING/NUMBNESS IN THE EXTREMITIES   . PAIN AT INJECTION SITE   .  DROWSINESS  .  POSSIBLE ELEVATION OF BLOOD SUGARS IF YOU ARE DIABETIC  5)  TO REACH DR. VALENTIN  .  CALL IF SYMPTOMS WORSEN SEVERELY OR FOR A SEVERE HEADACHE THAT WORSENS WHEN UPRIGHT     .  CALL IF YOU DEVELOP A FEVER GREATER THAN 101  .  CALL [phone] TO MAKE A FOLLOW UP APPOINTMENT WITH DR. VALENTIN   .  CALL REFERRING DOCTOR IF ANY PROBLEMS   6)  ADDITIONAL INSTRUCTIONS  .  IT MAY TAKE 24 TO 36 HOURS TO FEEL IMPROVEMENT

## 2017-02-17 NOTE — Progress Notes (Signed)
NURSING CARE PLANS FOLLOWED     ?? Potential for anxiety-decrease anxiety-allow patient to verbalize  ?? Potential for infection-no infection-proper infection controls  ?? Potential for fall the patient will move to fall risk after procedure- through the recovery  phase   ?? Orient to environment  ?? Call light in reach  ?? Instruct to call for assistance prior to getting up  ?? Non skid footwear on   ?? Bed wheels locked and bed in lowest position - siderails up times two  ?? Potential for deep sedation-no deep sedation-know maximum allowable dose         adverse reactions and nursing considerations. Assess VS and LOC

## 2017-02-17 NOTE — Other (Signed)
Sedation Post Procedure Note    POST SEDATION ASSESSMENT      Patient:  Jordan Gutierrez, Jordan Gutierrez   DOB:  [birth year]  Medical Record No.:  [id number]   Date:  02/17/2017  Physician:  Pierre Bali, M.D.      Patient location: Recovery  Level of consciousness: Awake, Alert, Oriented  Pain Control: Good  Respiration: Adequate  Post-op assessment: No sedation complications    Last Vitals:   Vitals:    02/17/17 0915   BP: 124/69   Pulse: 84   Resp: 16   Temp:    SpO2: 96%     Post-op Vitals: Stable    Ephriam Turman Clifford Ashlea Dusing  10:26 AM

## 2017-02-17 NOTE — Op Note (Signed)
Patient:  Jordan Gutierrez, Jordan Gutierrez   Medical Record #:  [id number]   Date:  02/17/2017  Physician:  Pierre Bali, M.D.  Facility: [facility]     Pre-op diagnosis:  Cervical radiculitis, cervical spondylosis  Post-op diagnosis:  same  Procedure: Left C7/T1 cervical interlaminar epidural injection #1 with flouroscopic guidance  Anesthesia: Conscious sedation with 1mg  Versed     Procedure Note:    The patient was admitted through pre-op and written consent was obtained.  The patient was advised of the risks and benefits of the procedure, including but not limited to the following: bleeding, pain, infection, temporary paralysis, nerve damage and spinal headache.  The patient was given the opportunity to ask questions.  There were no contraindications for this procedure.    The appropriate area was prepped and draped in a sterile fashion.  Landmarks were identified and marked.  The skin and soft tissues were anesthetized with 1% lidocaine.  A 22G 3.5inch Touhy needle was advanced to the left C7/T1 interlaminar space using fluoroscopic guidance confirmed by multiple views showing appropriate needle placement.  Injection of contrast showed epidural flow.  There were no signs of intravascular or intrathecal injection.    10 mg dexamethasone and 2 mL normal saline solution were then injected.     There were no complications and the patient tolerated the procedure well.  The patient was transferred to the recovery area and monitored.  Discharge instructions were given.  The patient is to contact me for any post-procedure concerns.  The patient is to follow up as scheduled.    LOR: 2.5cm    Dyke Weible Donato Schultz, MD

## 2017-02-17 NOTE — Progress Notes (Signed)
Cervical Epidural Steroid Injection  Dr. Eduardo OsierValentin    Dexamethasone 10mg /ml:  1ml  0.9% Normal Saline:  2ml  Omnipaque 240mg /ml:  5ml    Prep:  Chloraprep    Prepped by: C. Simpson St    Position:  Prone on Cervical Pillow    Circulator:  M.Barlett RN  RT Tech:  C. Jelus RT  Tech:  C. Simpson ST

## 2017-02-17 NOTE — Progress Notes (Signed)
Site of injection clean dry and intact. Instructions reviewed with pt and family.  Verbalized understanding. To be discharged to home with family.

## 2017-02-17 NOTE — H&P (Signed)
HISTORY AND PHYSICAL/PRE-SEDATION ASSESSMENT    Patient:  Jordan Gutierrez, Jordan Gutierrez   DOB:  02/18/1929  Medical Record No.:  [id number]   Date:  02/17/2017  Physician:  Pierre Bali, M.D.  Facility: [facility]     Nursing History and Physical reviewed and agreed upon.      Additional findings:    Allergies:  Review of patient's allergies indicates no known allergies.    Home Medications:    Prior to Admission medications    Medication Sig Start Date End Date Taking? Authorizing Provider   HYDROcodone-acetaminophen (NORCO) 7.5-325 MG per tablet TAKE 1 PO QID PRN FOR BREAK THROUGH PAIN. Earliest Fill Date: 02/13/17 02/13/17 02/26/17  Pierre Bali, MD   HYDROcodone-acetaminophen (NORCO) 7.5-325 MG per tablet TAKE 1 PO QID PRN FOR BREAK THROUGH PAIN. Earliest Fill Date: 03/12/17 03/12/17 03/27/17  Pierre Bali, MD   fentaNYL (DURAGESIC) 12 MCG/HR ONE PATCH EVERY 72 HOURS. Earliest Fill Date: 03/12/17 03/12/17 03/27/17  Pierre Bali, MD   fentaNYL (DURAGESIC) 25 MCG/HR Q 72 HOURS. Earliest Fill Date: 02/13/17 02/13/17 02/26/17  Pierre Bali, MD   fentaNYL (DURAGESIC) 25 MCG/HR Q 72 HOURS. Earliest Fill Date: 03/12/17 03/12/17 03/26/17  Pierre Bali, MD   HYDROcodone-acetaminophen (NORCO) 7.5-325 MG per tablet 1 PO QID PRN FOR BREAK THROUGH PAIN. Earliest Fill Date: 02/13/17 02/13/17 02/26/17  Pierre Bali, MD   HYDROcodone-acetaminophen (NORCO) 7.5-325 MG per tablet 1 PO QID PRN FOR BREAK THOUGH PAIN. Earliest Fill Date: 03/12/17 03/12/17 03/27/17  Pierre Bali, MD   HYDROcodone-acetaminophen [facility]) 5-325 MG per tablet Take 1 tablet by mouth every 8 hours as needed for Pain . 12/26/16   Mariane Baumgarten, PA   oxyCODONE-acetaminophen (PERCOCET) 5-325 MG per tablet Take 1-2 tablets every 4-6 hours as needed for pain.. 12/06/16   Mariane Baumgarten, PA   docusate sodium (COLACE) 100 MG capsule Take 1 capsule by mouth 2 times daily as needed for Constipation 12/06/16   Mariane Baumgarten,  PA   cyanocobalamin 1000 MCG/ML injection every 14 days  07/29/16   Historical Provider, MD   escitalopram (LEXAPRO) 20 MG tablet 20 mg daily  05/08/16   Historical Provider, MD   NEXIUM 40 MG delayed release capsule every morning (before breakfast)  04/29/16   Historical Provider, MD   lisinopril (PRINIVIL;ZESTRIL) 5 MG tablet Take 5 mg by mouth daily  05/10/16   Historical Provider, MD   TOPROL XL 25 MG extended release tablet Take 25 mg by mouth daily  05/10/16   Historical Provider, MD   tamsulosin (FLOMAX) 0.4 MG capsule Take 0.4 mg by mouth daily  05/28/16   Historical Provider, MD   aspirin 81 MG tablet Take 81 mg by mouth daily    Historical Provider, MD   pregabalin (LYRICA) 50 MG capsule Take 50 mg by mouth 2 times daily.    Historical Provider, MD   finasteride (PROSCAR) 5 MG tablet Take 5 mg by mouth daily.    Historical Provider, MD   METFORMIN HCL PO Take  by mouth. TAKE 2 TABLETS TWICE A DAY     Historical Provider, MD   ZOLPIDEM TARTRATE PO Take  by mouth nightly.      Historical Provider, MD       Vitals: Stable       PHYSICAL EXAM:  HENT: Airway patent and reviewed  Cardiovascular: Normal rate, regular rhythm, normal heart sounds.   Pulmonary/Chest: No wheezes. No rhonchi.  No rales.   Abdominal: Soft. Bowel sounds are normal. No distension.    ASA CLASS:         []    I. Normal, healthy adult           [x]    II.  Mild systemic disease            []    III.  Severe systemic disease      Sedation plan:   [x]   Local              [x]   Minimal                  []   General anesthesia    Patient's condition acceptable for planned procedure/sedation.   Post Procedure Plan   Return to same level of care   ______________________     The risks and benefits as well as alternatives to the procedure have been discussed with the patient and or family.  The patient and or next of kin understands and agrees to proceed.    Pierre Bali, M.D.

## 2017-02-27 ENCOUNTER — Encounter: Attending: Physician Assistant | Primary: Internal Medicine

## 2017-03-18 NOTE — Telephone Encounter (Signed)
SPOKE WITH [facility] ANS GAVE TO OK TI FILL THE DURAGESIC PATCHES DATED FOR FILL ON 02/13/17

## 2017-03-27 ENCOUNTER — Ambulatory Visit
Admit: 2017-03-27 | Discharge: 2017-03-27 | Payer: MEDICARE | Attending: Physical Medicine & Rehabilitation | Primary: Internal Medicine

## 2017-03-27 DIAGNOSIS — M48062 Spinal stenosis, lumbar region with neurogenic claudication: Secondary | ICD-10-CM

## 2017-03-27 MED ORDER — FENTANYL 25 MCG/HR TD PT72
25 MCG/HR | MEDICATED_PATCH | TRANSDERMAL | 0 refills | Status: AC
Start: 2017-03-27 — End: 2017-04-30

## 2017-03-27 MED ORDER — FENTANYL 25 MCG/HR TD PT72
25 MCG/HR | MEDICATED_PATCH | TRANSDERMAL | 0 refills | Status: AC
Start: 2017-03-27 — End: 2017-05-28

## 2017-03-27 MED ORDER — HYDROCODONE-ACETAMINOPHEN 5-325 MG PO TABS
5-325 | ORAL_TABLET | ORAL | 0 refills | Status: AC
Start: 2017-03-27 — End: 2017-04-28

## 2017-03-27 MED ORDER — HYDROCODONE-ACETAMINOPHEN 7.5-325 MG PO TABS
7.5-325 | ORAL_TABLET | ORAL | 0 refills | Status: AC
Start: 2017-03-27 — End: 2017-05-29

## 2017-03-27 NOTE — Progress Notes (Signed)
 Follow up: SPINE    CHIEF COMPLAINT:    Chief Complaint   Patient presents with   ??? Medication Refill       HISTORY OF PRESENT ILLNESS:                The patient is a 83 y.o. male here to follow up medication maintenance Chronic neck pain chronic back pain status post lumbar decompression.  He is progressing also back with improved function and walking tolerance is.  The last cervical epidural helped his neck pain.  Still pain in his neck and shoulders, worse with neck range of motion.  No radiating arm pain      Pain Assessment  Location of Pain: Back  Severity of Pain: 4  Quality of Pain: Aching, Dull  Duration of Pain: Persistent  Frequency of Pain: Constant  Aggravating Factors: Bending, Stretching, Straightening, Exercise, Kneeling, Squatting, Standing, Walking, Stairs  Limiting Behavior: Yes  Relieving Factors: Rest  Result of Injury: No  Work-Related Injury: No  Are there other pain locations you wish to document?: No    Current/Past Treatment:   ?? Physical Therapy:   ?? Chiropractic:     ?? Injection:  01/03/15: Lt C7-T1 CESI  12/04/15: Lt C7-T1 CESI   08/16/16: Lt C7-T1 CESI  -B L3 TX ESI--Dr. Arlington Calix  09/10/16 Bil L4-5 TX ESI  02/17/17 Lt C7/T1 CESI    ?? Medications:   Currently on Duragesic 25 ??g patch and hydrocodone 7.5 mg q.4 hours p.r.n.  ?? Surgery/Consult:    Function-Does the pain medication improve your ability to do:   ?? Personal care: Yes  ?? Housework: Yes   ?? Physical activity: Yes  ?? Social activity: Yes  ?? to work: Yes     Pain Scale: 1-10  With Meds:   4  Pain Scale: 1-10 Without Meds:8    Potential aberrant drug-related behavior:  ?? Aberrant behavior identified? NO  ?? Potential aberrant behavior identified? NO  ?? Reports loss are stolen prescriptions? NO  ?? Insist on certain medications by name? NO  ?? Purposeful oversedation? NO  ?? Increased dose without authorization? NO    Pain Maanvi Lecompte/u information:  ?? MED:105 currently  ?? Last UDS: 03/27/17 + MOR c/w hydrocodone  ?? Pain contract/ORT: March 27, 2017, ORT=0    Side Effects:     Past Medical History: Medical history form was reviewd today & scanned into the Media tab  Past Medical History:   Diagnosis Date   ??? Acid reflux    ??? CAD (coronary artery disease)    ??? Cancer (HCC)     PROSTATE CANCER   ??? Chronic pain     neck, shoulder, and back   ??? Diabetes mellitus (HCC)    ??? HOH (hard of hearing)     hearing imparied   ??? Hypertension    ??? Pancreatitis 10/15/11        REVIEW OF SYSTEMS:   CONSTITUTIONAL: Denies unexplained weight loss, fevers, chills or fatigue  NEUROLOGIC: Denies tremors or seizures         PHYSICAL EXAM:    Vitals: Blood pressure 118/81, pulse 62, height  (1.778 m), weight 190 lb 0.6 oz (86.2 kg).    GENERAL EXAM:  ?? General Apparence: Patient is adequately groomed with no evidence of malnutrition.  ?? Orientation: The patient is oriented to time, place and person.   ?? Mood & Affect:The patient's mood and affect are appropriate  ?? Vascular: Examination reveals  no swelling tenderness in upper or lower extremities.   ?? Lymphatic: The lymphatic examination bilaterally reveals all areas to be without enlargement or induration  ?? Sensation: Sensation is intact without deficit  ?? Coordination/Balance: Good coordination     CERVICAL EXAMINATION:  ?? Inspection: Local inspection shows no step-off or bruising.  Cervical alignment is normal.     ?? Palpation: No evidence of tenderness at the midline, and trapezius.  Paraspinal tenderness is present. There is no step-off or paraspinal spasm.   ?? Range of Motion: 50% loss of flexion extension rotation left and right  ?? Strength: 5/5 bilateral upper extremities   ?? Special Tests:    ??   Spurling's, L'Hermitte's & Hoffman's negative bilaterally.   ??   Hawkins and Impingement tests are negative bilaterally.   ??  Cubital and Carpal tunnel Tinel's negative bilaterally.      ?? Skin:There are no rashes, ulcerations or lesions in right & left upper extremities.  ?? Reflexes: Bilaterally triceps, biceps and  brachioradialis are 2+.  Clonus absent bilaterally at the feet.   ?? Additional Examinations:       ?? RIGHT UPPER EXTREMITY:  Inspection/examination of the right upper extremity does not show any tenderness, deformity or injury. Range of motion is full. There is no gross instability.  There are no rashes, ulcerations or lesions. Strength and tone are normal.  ?? LEFT UPPER EXTREMITY: Inspection/examination of the left upper extremity does not show any tenderness, deformity or injury. Range of motion is full. There is no gross instability.  There are no rashes, ulcerations or lesions. Strength and tone are normal.      Diagnostic Testing:     UDS June 2016 positive for OxyIR as expected  ??  His recent lumbar MRI report reviewed showing significant lumbar stenosis L2-3 and L4-5  ??  08/02/16??MRI cervical spine: ??  IMPRESSION]   ????   ????   Broad-based disc bulging/posterior osteophytic ridging with midline disc protrusions at the    C3-4 and C4-5 levels with moderate to severe narrowing of the central canal.   ????   Facet arthropathy at C5-6 and to a lesser degree C6-7 with neural foraminal narrowings as    described.   ??      Impression:    Chronic neck pain and cervical spondylosis  Chronic back pain history of lumbar decompression Dr. Adah Salvage  Opioid maintenance, low risk,MED  105    Plan:      He just filled his last prescription's a few days ago    On or around April 16, 2017 he'll start Duragesic 25 ??g and hydrocodone 7.5 mg q.i.d.    The following month around May 17 he'll start Duragesic 25 ??g and hydrocodone 7.5 t.i.d.    I'll see him back at that point and he'll begin further weaning off the hydrocodone in the goals continue on the Duragesic patch      Controlled Substances Monitoring:     The Prescription Monitoring Report for this patient was reviewed today. Carmon Ginsberg Donato Schultz, MD)    Possible medication side effects, risk of tolerance/dependence & alternative treatments discussed., Obtaining appropriate  analgesic effect of treatment., No signs of potential drug abuse or diversion identified. Carmon Ginsberg Donato Schultz, MD)         Treatment objectives documented - patient is progressing appropriately., Functional status reviewed - continues with improved or maintaining ADL's., Reestablished informed consent., Reviewed the patient's functional status and documentation., Dose reduction has  been attempted. Carmon Ginsberg Donato Schultz, MD)             The risks and use of maintenance opiate medication were reviewed.  These include the risk of tolerance, addition, and abuse.  Potential side effects were also discussed.   Medications are to be taken as prescribed and not escalated without prior agreement.  OARRS/KASPER reviewed & appropriate.   Opiate medications will only be prescribed through the office of Dr Carmon Ginsberg. Donato Schultz, M. D. unless notified otherwise         Burdette Gergely Donato Schultz

## 2017-06-05 ENCOUNTER — Encounter: Attending: Physical Medicine & Rehabilitation | Primary: Internal Medicine

## 2017-06-18 ENCOUNTER — Ambulatory Visit: Admit: 2017-06-18 | Discharge: 2017-06-18 | Payer: MEDICARE | Attending: Surgical | Primary: Internal Medicine

## 2017-06-18 DIAGNOSIS — M48062 Spinal stenosis, lumbar region with neurogenic claudication: Secondary | ICD-10-CM

## 2017-06-18 NOTE — Progress Notes (Signed)
Chief Complaint   Patient presents with   . Follow-up     ck back      [name] arrives today 7 months s/p L2-3 MLL. He continues to note moderate to severe low back pain with standing and walking. The pain intermittently radiates into the posterior aspect of his legs bilaterally. He notes this pain is the same distribution as his pain prior to surgery, but more severe. He can stand and walk approximately 10 minutes. The pain resolves when he sits. He is taking Norco and Fentanyl patch. He tried epidural injections prior to surgery with minimal relief.     Exam:  General Exam:  Pt is alert and oriented x 3 and in no acute distress.  Gait is heel toe with no limp or instability.  Mood and affect are appropriate.    Back:  No deformity.   His surgical scar appears well healed. No rash.  Negative for back pain on flexion.  Positive pain bilaterally on extension.  reduced ROM overall.  Negative pain on palpation.    Lower Extremities:   He has 5/5 strength of EHLs, FHLs, foot evertors, ankle dorsiflexors, plantarflexors, quadriceps, hamstrings, iliopsoas, abductors and adductors about the hips bilaterally. He has a negative straight leg raise, bilaterally.  Achilles and quadriceps reflexes are 1+. Sensation is intact to light touch L3 to S1 bilaterally. He has no clonus. Hip range of motion painless.    Imaging:  I reviewed MRI radiology report of his lumbar spine from 08/02/16. They note multilevel degenerative changes with severe stenosis at L2-3 and moderate stenosis L4-5.     Impression:   Diagnosis Orders   1. Spinal stenosis of lumbar region with neurogenic claudication  MRI LUMBAR SPINE W WO CONTRAST       A/P  I recommend he have a new MRI of his lumbar spine with and without gadolinium. He will return to review his MRI results with Dr. Sande BrothersFerree to see if microlumbar decompression is indicated at L4-5.     Jordan ProvostKelly Rhylin Venters, PA-C

## 2017-06-19 ENCOUNTER — Ambulatory Visit
Admit: 2017-06-19 | Discharge: 2017-06-19 | Payer: MEDICARE | Attending: Physician Assistant | Primary: Internal Medicine

## 2017-06-19 ENCOUNTER — Telehealth

## 2017-06-19 DIAGNOSIS — G894 Chronic pain syndrome: Secondary | ICD-10-CM

## 2017-06-19 MED ORDER — HYDROCODONE-ACETAMINOPHEN 7.5-325 MG PO TABS
ORAL_TABLET | Freq: Three times a day (TID) | ORAL | 0 refills | Status: AC | PRN
Start: 2017-06-19 — End: 2017-08-18

## 2017-06-19 MED ORDER — HYDROCODONE-ACETAMINOPHEN 7.5-325 MG PO TABS
ORAL_TABLET | Freq: Three times a day (TID) | ORAL | 0 refills | Status: AC | PRN
Start: 2017-06-19 — End: 2017-09-16

## 2017-06-19 MED ORDER — FENTANYL 25 MCG/HR TD PT72
25 MCG/HR | MEDICATED_PATCH | TRANSDERMAL | 0 refills | Status: AC
Start: 2017-06-19 — End: 2017-08-17

## 2017-06-19 MED ORDER — FENTANYL 25 MCG/HR TD PT72
25 MCG/HR | MEDICATED_PATCH | TRANSDERMAL | 0 refills | Status: AC
Start: 2017-06-19 — End: 2017-07-19

## 2017-06-19 MED ORDER — FENTANYL 25 MCG/HR TD PT72
25 MCG/HR | MEDICATED_PATCH | TRANSDERMAL | 0 refills | Status: AC
Start: 2017-06-19 — End: 2017-09-16

## 2017-06-19 MED ORDER — HYDROCODONE-ACETAMINOPHEN 7.5-325 MG PO TABS
ORAL_TABLET | Freq: Three times a day (TID) | ORAL | 0 refills | Status: AC | PRN
Start: 2017-06-19 — End: 2017-07-19

## 2017-06-19 NOTE — Progress Notes (Signed)
 Follow up: SPINE    CHIEF COMPLAINT:    Chief Complaint   Patient presents with   . Medication Refill     Saw Jolene Provost 06/18/17 for back pain. New MRI was ordered.       HISTORY OF PRESENT ILLNESS:                The patient is a 83 y.o. male s/p MLL L2-3 with Dr. Jefm Petty from 11-2016, here to follow up medication maintenance for history of chronic aching neck/trap pain in addition to worsening Low back and radiating bilateral posterior leg pain.  His low back symptoms have been more severe over the last 2 months. Pain is worsened w/walking or standing. Some relief with sitting or resting. ng.  He reported temporary relief with prior ESIs.  He still reports good improvement with (now weaned) Duragesic 25g & Norco 7.5/325 TID PRN without side effects.  This medication does allow him to be more functional he is able to walk and stand longer distances perform ADLs independently.  He denies any progressive numbness tingling or weakness.  No recent bowel or bladder changes.     Pending updated lumbar MRI W & WO Monday ordered by Dr. Sande Brothers    Pain Assessment  Location of Pain: Back  Severity of Pain: 6  Quality of Pain: Aching, Dull  Duration of Pain: Persistent  Frequency of Pain: Constant  Aggravating Factors: Standing  Limiting Behavior: Yes  Relieving Factors: Rest, Ice, Heat  Result of Injury: No  Work-Related Injury: No  Are there other pain locations you wish to document?: No    Past/Current Treatment:   PT: Yes  Meds: Duragesic 50 g q.48 hours, hydrocodone 7.5 mg q.i.d.  Injection:   01/03/15: Lt C7-T1 CESI  12/04/15: Lt C7-T1 CESI   08/16/16: Lt C7-T1 CESI--60%  -B L3 TX ESI--Dr. Arlington Calix  09/10/16 Bil L4-5 TX ESI--50%  02/17/17 Lt C7/T1 CESI   Sx: Shoulder, Dr. Paulina Fusi; pending surgical spine consult with Dr. Sande Brothers    Function-Does the pain medication improve your ability to do:    Personal care: Yes   Housework: Yes    Physical activity: Yes   Social activity: Yes    Pain Scale: 1-10  With Meds:    4/10  Pain Scale: 1-10 Without Meds: 10+/10    Potential aberrant drug-related behavior:   Aberrant behavior identified? NO   Potential aberrant behavior identified? NO   Reports loss are stolen prescriptions? NO   Insist on certain medications by name? NO   Purposeful oversedation? NO   Increased dose without authorization? NO   MED: weaned to 82.5   Last UDS: 03/27/17   ORT/pain contract: 03-27-17  ORT=0    Side Effects: Denies    Past Medical History: Medical history form was reviewd today & scanned into the Media tab  Past Medical History:   Diagnosis Date   . Acid reflux    . CAD (coronary artery disease)    . Cancer [facility])     PROSTATE CANCER   . Chronic pain     neck, shoulder, and back   . Diabetes mellitus (HCC)    . HOH (hard of hearing)     hearing imparied   . Hypertension    . Pancreatitis 10/15/11        REVIEW OF SYSTEMS:   CONSTITUTIONAL: Denies unexplained weight loss, fevers, chills or fatigue  NEUROLOGIC: Denies tremors or seizures  PHYSICAL EXAM:    Vitals: Blood pressure 137/75, pulse 94, height 5\' 10"  (1.778 m), weight 190 lb 0.6 oz (86.2 kg).    GENERAL EXAM:   General Apparence: Patient is adequately groomed with no evidence of malnutrition.   Orientation: The patient is oriented to time, place and person.    Mood & Affect:The patient's mood and affect are appropriate   Lymphatic: The lymphatic examination bilaterally reveals all areas to be without enlargement or induration   Sensation: Sensation is intact without deficit  LUMBAR/SACRAL EXAMINATION:   Inspection: Local inspection shows no step-off or bruising. Mild kyphosis        Palpation:   No evidence of tenderness at the midline.  No tenderness bilaterally at the paraspinal or trochanters.  There is no step-off or paraspinal spasm.    Range of Motion:  Able to sit forward flexed     Strength:   Strength testing is 5/5 in all muscle groups tested.    Special Tests:   Straight leg raise and crossed SLR negative.   Leg length and pelvis level.  0 out of 5 Waddell's signs.         Skin: There are no rashes, ulcerations or lesions.   Reflexes: Reflexes are symmetrically trace-1+at the patellar and ankle tendons.  Clonus absent bilaterally at the feet.   Gait & station: forward flexed unassisted       Additional Examinations:    RIGHT LOWER EXTREMITY: Inspection/examination of the right lower extremity does not show any tenderness, deformity or injury. Range of motion is full. There is no gross instability.  There are no rashes, ulcerations or lesions. Strength and tone are normal.   LEFT LOWER EXTREMITY:  Inspection/examination of the left lower extremity does not show any tenderness, deformity or injury. Range of motion is full. There is no gross instability. There are no rashes, ulcerations or lesions.  Strength and tone are normal.    Diagnostic Testing:   His recent lumbar MRI report reviewed showing significant lumbar stenosis L2-3 and L4-5    8/4/17MRI cervical spine:   IMPRESSION]         Broad-based disc bulging/posterior osteophytic ridging with midline disc protrusions at the    C3-4 and C4-5 levels with moderate to severe narrowing of the central canal.      Facet arthropathy at C5-6 and to a lesser degree C6-7 with neural foraminal narrowings as    described.        UDS June 2016 positive for OxyIR as expected      Impression:  1) Acute/chronic lbp, bilateral lumbar radiculitis, neurogenic claudication  2) Severe lumbar stenosis L2-3, L4 5  3) Chronic neck/trap pain w/underlying spondylosis  4) H/o prostate cancer  5) Opioid maintenance, low risk ORT=0       Plan:    1) Duragesic q72 x3 scripts--this was weaned down last visit   2) Norco 7.5/325 I po TID PRN #90 x 3 scripts  3) He is pending updated L MRI and consult w/Dr. Sande Brothers next week  4) Despite higher MED he reports increased functionality with decreased pain for his chronic neck and low back issues without side effects. He has been  weaned down   5) F/u 10mo     Controlled Substances Monitoring:     RX Monitoring 06/19/2017   Attestation The Prescription Monitoring Report for this patient was reviewed today.   Documentation Possible medication side effects, risk of tolerance/dependence & alternative treatments discussed.;No signs  of potential drug abuse or diversion identified.;Obtaining appropriate analgesic effect of treatment.   Chronic Pain Treatment objectives documented - patient is progressing appropriately.;Functional status reviewed - continues with improved or maintaining ADL's.;Reviewed the patient's functional status and documentation.;Dose reduction has been attempted.     The risks and use of maintenance opiate medication were reviewed.  These include the risk of tolerance, addition, and abuse.  Potential side effects were also discussed.   Medications are to be taken as prescribed and not escalated without prior agreement.  OARRS/KASPER reviewed & appropriate.   Opiate medications will only be prescribed through the office of Dr Carmon GinsbergF. Donato Schultzlifford Valentin, M. D. unless notified otherwise           Dictated by Marene LenzMegan Javier Gell, PA-C, also seen & evaluated by Dr. Donato Schultzlifford  Valentin

## 2017-06-19 NOTE — Telephone Encounter (Signed)
Ordered test

## 2017-06-19 NOTE — Telephone Encounter (Signed)
[facility]    Since the MRI is with contrast they need orders for him to have his creatinine levels checked  Will Tresa EndoKelly write the orders?    Pls fx to [phone]

## 2017-06-20 ENCOUNTER — Encounter

## 2017-06-23 LAB — CREATININE
Creatinine + eGFR Panel: 1 mg/dL (ref 0.60–1.3)
GFR African American: >60 mL/min/{1.73_m2} (ref >60)
GFR Non-African American: >60 mL/min/{1.73_m2} (ref >60)

## 2017-07-01 NOTE — Telephone Encounter (Signed)
Spoke with patient's son and let him know that I will look into finding his disc and scheduling him for surgery.  I will call him back Thursday.

## 2017-07-04 NOTE — Telephone Encounter (Signed)
Left message for patient's son to call back    Dr. Sande BrothersFerree reviewed MRI:  Arthritis, No Severe nerve compression, Stenosis is Better.  He would like for him to try ESI and if that doesn't help, then he can return and discuss additional options with Dr. Sande BrothersFerree.

## 2017-07-04 NOTE — Telephone Encounter (Signed)
Spoke with patient's son and reviewed results.  He would like to get scheduled for ESI.  I will send Missy/Kristin a message.

## 2017-07-10 NOTE — Telephone Encounter (Signed)
error 

## 2017-07-14 NOTE — H&P (Signed)
HISTORY AND PHYSICAL/PRE-SEDATION ASSESSMENT    Patient:  Jordan Gutierrez, Jordan Gutierrez   DOB:  1929/04/16  Medical Record No.:  [id number]   Date:  07/15/2017  Physician:  Pierre BaliF Clifford Embrie Mikkelsen, M.D.  [facility]     Nursing History and Physical reviewed and agreed upon.      Additional findings:    Allergies:  Patient has no known allergies.    Home Medications:    Prior to Admission medications    Medication Sig Start Date End Date Taking? Authorizing Provider   fentaNYL (DURAGESIC) 25 MCG/HR Place 1 patch onto the skin every 3 days for 30 days. Intended supply: 30 days. Earliest Fill Date: 06/19/17 06/19/17 07/19/17  Wonda CeriseMegan Marie Stanfield, PA-C   fentaNYL (DURAGESIC) 25 MCG/HR Place 1 patch onto the skin every 3 days for 30 days. Intended supply: 30 days. Earliest Fill Date: 07/18/17 07/18/17 08/17/17  Wonda CeriseMegan Marie Stanfield, PA-C   fentaNYL (DURAGESIC) 25 MCG/HR Place 1 patch onto the skin every 3 days for 30 days. Intended supply: 30 days. Earliest Fill Date: 08/17/17 08/17/17 09/16/17  Wonda CeriseMegan Marie Stanfield, PA-C   HYDROcodone-acetaminophen [facility](NORCO) 7.5-325 MG per tablet Take 1 tablet by mouth 3 times daily as needed for Pain for up to 30 days.Raynelle Dick. Earliest Fill Date: 06/19/17 06/19/17 07/19/17  Aundra MilletMegan Roxana HiresMarie Stanfield, PA-C   HYDROcodone-acetaminophen [facility](NORCO) 7.5-325 MG per tablet Take 1 tablet by mouth 3 times daily as needed for Pain for up to 30 days.Raynelle Dick. Earliest Fill Date: 07/19/17 07/19/17 08/18/17  Wonda CeriseMegan Marie Stanfield, PA-C   HYDROcodone-acetaminophen [facility](NORCO) 7.5-325 MG per tablet Take 1 tablet by mouth 3 times daily as needed for Pain for up to 30 days.Raynelle Dick. Earliest Fill Date: 08/17/17 08/17/17 09/16/17  Aundra MilletMegan Roxana HiresMarie Stanfield, PA-C   docusate sodium (COLACE) 100 MG capsule Take 1 capsule by mouth 2 times daily as needed for Constipation 12/06/16   Mariane BaumgartenKelly M Pitzer, PA   cyanocobalamin 1000 MCG/ML injection every 14 days  07/29/16   Historical Provider, MD   escitalopram (LEXAPRO) 20 MG tablet 20 mg daily  05/08/16    Historical Provider, MD   NEXIUM 40 MG delayed release capsule every morning (before breakfast)  04/29/16   Historical Provider, MD   lisinopril (PRINIVIL;ZESTRIL) 5 MG tablet Take 5 mg by mouth daily  05/10/16   Historical Provider, MD   TOPROL XL 25 MG extended release tablet Take 25 mg by mouth daily  05/10/16   Historical Provider, MD   tamsulosin (FLOMAX) 0.4 MG capsule Take 0.4 mg by mouth daily  05/28/16   Historical Provider, MD   aspirin 81 MG tablet Take 81 mg by mouth daily    Historical Provider, MD   pregabalin (LYRICA) 50 MG capsule Take 50 mg by mouth 2 times daily.    Historical Provider, MD   finasteride (PROSCAR) 5 MG tablet Take 5 mg by mouth daily.    Historical Provider, MD   METFORMIN HCL PO Take  by mouth. TAKE 2 TABLETS TWICE A DAY     Historical Provider, MD   ZOLPIDEM TARTRATE PO Take  by mouth nightly.      Historical Provider, MD       Vitals: Stable       PHYSICAL EXAM:  HENT: Airway patent and reviewed  Cardiovascular: Normal rate, regular rhythm, normal heart sounds.   Pulmonary/Chest: No wheezes. No rhonchi. No rales.   Abdominal: Soft. Bowel sounds are normal. No distension.    ASA CLASS:         []   I. Normal, healthy adult           _0    II.  Mild systemic disease            _1    III.  Severe systemic disease      Sedation plan:   _2   Local              _3   Minimal                  _4   General anesthesia    Patient's condition acceptable for planned procedure/sedation.   Post Procedure Plan   Return to same level of care   ______________________     The risks and benefits as well as alternatives to the procedure have been discussed with the patient and or family.  The patient and or next of kin understands and agrees to proceed.    Joslyn Devon, M.D.

## 2017-07-15 ENCOUNTER — Inpatient Hospital Stay: Payer: MEDICARE

## 2017-07-15 LAB — POCT GLUCOSE: POC Glucose: 179 mg/dl — ABNORMAL HIGH (ref 70–99)

## 2017-07-15 NOTE — Op Note (Signed)
Patient:  Jordan Gutierrez, Jordan Gutierrez   Medical Record #:  [id number]   Date:  07-15-17  Physician:  Pierre BaliF Clifford Devoiry Corriher, M.D.  [facility]       Pre-op diagnosis: Lumbar radiculitis, lumbar spondylosis, lumbar stenosis  Post-op diagnosis:  same  Procedure: Bilateral L4 5 transforaminal epidural injection #3 with flouroscopic guidance     Procedure Note:    The patient was admitted through pre-op and written consent was obtained.  The patient was advised of the risks and benefits of the procedure, including but not limited to the following: bleeding, pain, infection, temporary paralysis, nerve damage and spinal headache.  The patient was given the opportunity to ask questions.  There were no contraindications for this procedure.    The appropriate area was prepped and draped in a sterile fashion.  Landmarks were identified and marked.  A 23G spinal needle was advanced to the right L4 neural foramen using fluoroscopic guidance with ideal needle tip placement confirmed by multiple views.  Injection of contrast showed epidural flow.  There were no signs of intravascular or intrathecal injection.    40 mg depomedrol and 1cc 1% lidocaine were then injected per site    There were no complications and the patient tolerated the procedure well.  The patient was transferred to the recovery area and monitored.  Discharge instructions were given.  The patient is to contact me for any post-procedure concerns.  The patient is to follow up as scheduled.    The same procedure was performed on the left side.    Pierre BaliF Clifford Dallas Scorsone, MD

## 2017-07-15 NOTE — Discharge Instructions (Signed)
[facility]          [phone]  POST EPIDURAL STEROID INJECTION    PATIENT INSTRUCTIONS:  1)  DIET   .  RESUME NORMAL DIET   .  RESUME ALL PRIOR MEDICATIONS  2)  ACTIVITY  .   DO NOT STAY ALONE FOR 4-6 HOURS AFTER THE PROCEDURE  .  REST TODAY  .  DO NOT DRIVE TODAY AND 24 HOURS IF YOU RECEIVED SEDATION.  IF YOU ARE   SEEN DRIVING DURING THIS TIME THE PROPER AUTHORITIES WILL BE NOTIFIED.  .  DO NOT SIGN ANY LEGAL DOCUMENTS, MAKE ANY MAJOR DECISIONS, OR BE INVOLVED IN WORK DECISIONS FOR THE REMAINDER OF THE DAY.   .  RESUME NORMAL ACTIVITIES.  DO NOT OVER EXERT YOURSELF.  .  SHOWER OR BATHE AS NORMAL  3)  SITE CARE   .  MAY USE ICE TO SITE IF NEEDED              .  KEEP SITE DRY FOR 12 HOURS.  IF A BAND-AID WAS APPLIED TO THE                 INJECTION  SITE REMOVE BAND-AID THE FOLLOWING DAY  .  OBSERVE PUNCTURE SITE FOR SIGNS OF INFECTION (REDNESS, WARMTH, SWELLING, PURULENT DRAINAGE, INCREASED TENDERNESS OR FEVER GREATER THAN 101)  .  REPORT SIGNS OF INFECTION TO THE PHYSICIAN  4)  EXPECTED SIDE EFFECTS   .  FLUID RETENTION   .  NERVOUS ENERGY   .  SLEEPLESSNESS   .  MUSCLE SPASMS  . TEMPORARY WEAKNESS/TINGLING/NUMBNESS IN THE EXTREMITIES   . PAIN AT INJECTION SITE   .  DROWSINESS  .  POSSIBLE ELEVATION OF BLOOD SUGARS IF YOU ARE DIABETIC  5)  TO REACH DR. VALENTIN  .  CALL IF SYMPTOMS WORSEN SEVERELY OR FOR A SEVERE HEADACHE THAT WORSENS WHEN UPRIGHT     .  CALL IF YOU DEVELOP A FEVER GREATER THAN 101  .  CALL [phone] TO MAKE A FOLLOW UP APPOINTMENT WITH DR. VALENTIN   .  CALL REFERRING DOCTOR IF ANY PROBLEMS   6)  ADDITIONAL INSTRUCTIONS  .  IT MAY TAKE 24 TO 36 HOURS TO FEEL IMPROVEMENT

## 2017-07-15 NOTE — Progress Notes (Signed)
Patient arrived in Post op phase 2 on cart.  Report from RN.  Site of injection without redness, drainage or bleeding.  Patient denies numbness, tingling or weakness.  Moves extremities x 4.

## 2017-09-11 ENCOUNTER — Ambulatory Visit
Admit: 2017-09-11 | Discharge: 2017-09-11 | Payer: MEDICARE | Attending: Physician Assistant | Primary: Internal Medicine

## 2017-09-11 DIAGNOSIS — G894 Chronic pain syndrome: Secondary | ICD-10-CM

## 2017-09-11 MED ORDER — HYDROCODONE-ACETAMINOPHEN 7.5-325 MG PO TABS
ORAL_TABLET | Freq: Three times a day (TID) | ORAL | 0 refills | Status: AC | PRN
Start: 2017-09-11 — End: 2017-10-23

## 2017-09-11 MED ORDER — FENTANYL 25 MCG/HR TD PT72
25 MCG/HR | MEDICATED_PATCH | TRANSDERMAL | 0 refills | Status: AC | PRN
Start: 2017-09-11 — End: 2017-12-20

## 2017-09-11 MED ORDER — FENTANYL 25 MCG/HR TD PT72
25 MCG/HR | MEDICATED_PATCH | TRANSDERMAL | 0 refills | Status: AC | PRN
Start: 2017-09-11 — End: 2017-11-21

## 2017-09-11 MED ORDER — FENTANYL 25 MCG/HR TD PT72
25 MCG/HR | MEDICATED_PATCH | TRANSDERMAL | 0 refills | Status: AC | PRN
Start: 2017-09-11 — End: 2017-10-23

## 2017-09-11 MED ORDER — HYDROCODONE-ACETAMINOPHEN 7.5-325 MG PO TABS
ORAL_TABLET | Freq: Three times a day (TID) | ORAL | 0 refills | Status: AC | PRN
Start: 2017-09-11 — End: 2017-12-20

## 2017-09-11 MED ORDER — HYDROCODONE-ACETAMINOPHEN 7.5-325 MG PO TABS
ORAL_TABLET | Freq: Three times a day (TID) | ORAL | 0 refills | Status: AC | PRN
Start: 2017-09-11 — End: 2017-11-21

## 2017-09-11 NOTE — Progress Notes (Signed)
 Follow up: SPINE    CHIEF COMPLAINT:    Chief Complaint   Patient presents with   . Back Pain       HISTORY OF PRESENT ILLNESS:                The patient is a 83 y.o. male s/p MLL L2-3 with Dr. Jefm PettyFeree from 11-2016, here to follow up medication maintenance for history of chronic aching neck/trap pain in addition to worsening low back and radiating bilateral posterior leg pain (L>R).  His low back symptoms have been more severe over the last 5 months. Pain is worsened w/walking or standing. Some relief with sitting or resting. He underwent recent bilateral L4 5 TX ESI #3 from 07/15/2017 without much improvement.  Recent follow-up with Dr. Sande BrothersFerree recommending ESI prior to any further surgical intervention.      He still reports good improvement with (now weaned) Duragesic 25g & Norco 7.5/325 TID PRN per pt no side effects. He does not feel that he would be able to further wean his medication at this time. This current dose of medication does allow him to be more functional he is able to walk and stand longer distances perform ADLs independently.  He is able to garden.  His son states he has not been as active over the last few months and feels that he is generally weaker and sleeping for prolonged period of time.  Denies any progressive numbness tingling.  No recent bowel or bladder changes.      Pain Assessment  Location of Pain: Back  Severity of Pain: 4  Quality of Pain: Sharp, Dull, Aching  Duration of Pain: Persistent  Frequency of Pain: Constant  Aggravating Factors: Stairs, Walking, Standing, Squatting, Kneeling, Exercise, Straightening, Stretching, Bending  Limiting Behavior: Yes  Relieving Factors: Rest  Result of Injury: No  Work-Related Injury: No  Are there other pain locations you wish to document?: No    Past/Current Treatment:   PT: Yes  Meds: WEANED to Duragesic 25 g q.72 hours, hydrocodone 7.5 mg TID PRN    Prior: Oral steroids, Gabapentin   Injection:   01/03/15: Lt C7-T1 CESI  12/04/15: Lt C7-T1 CESI    08/16/16: Lt C7-T1 CESI--60%  -B L3 TX ESI--Dr. Arlington CalixSingla  09/10/16 Bil L4-5 TX ESI--50%  02/17/17 Lt C7/T1 CESI   07/15/17 Bilateral L4 5 TX ESI #3 --no significant improvement   Sx: Shoulder, Dr. Paulina FusiHess; s/p MLL Dr. Sande BrothersFerree December 2017 with recent consult recommending ESI prior to further surgery    Function-Does the pain medication improve your ability to do:    Personal care: Yes   Housework: Yes    Physical activity: Yes   Social activity: Yes    Pain Scale: 1-10  With Meds:  3- 4/10  Pain Scale: 1-10 Without Meds: 10+/10    Potential aberrant drug-related behavior:   Aberrant behavior identified? NO   Potential aberrant behavior identified? NO   Reports loss are stolen prescriptions? NO   Insist on certain medications by name? NO   Purposeful oversedation? NO   Increased dose without authorization? NO   MED: weaned to 82.5   Last UDS: 03/27/17 + MOR c/w hydrocodone--per note, not scanned   ORT/pain contract: 03-27-17  ORT=0    Side Effects: Denies    Past Medical History: Medical history form was reviewd today & scanned into the Media tab  Past Medical History:   Diagnosis Date   . Acid reflux    . CAD (  coronary artery disease)    . Cancer [facility])     PROSTATE CANCER   . Chronic pain     neck, shoulder, and back   . Diabetes mellitus (HCC)    . HOH (hard of hearing)     hearing imparied   . Hypertension    . Pancreatitis 10/15/11        REVIEW OF SYSTEMS:   CONSTITUTIONAL: Denies unexplained weight loss, fevers, chills   NEUROLOGIC: Denies tremors or seizures         PHYSICAL EXAM:    Vitals: Blood pressure 125/63, pulse 91, height  (1.778 m), weight 190 lb (86.2 kg).    GENERAL EXAM:   General Apparence: Patient is adequately groomed with no evidence of malnutrition.   Orientation: The patient is oriented to time, place and person.    Mood & Affect:The patient's mood and affect are appropriate   Lymphatic: The lymphatic examination bilaterally reveals all areas to be without enlargement or  induration   Sensation: Sensation is intact without deficit  LUMBAR/SACRAL EXAMINATION:   Inspection: Local inspection shows no step-off or bruising. Mild kyphosis        Palpation:   No evidence of tenderness at the midline.     Range of Motion:  Able to sit forward flexed     Strength:   Strength testing is 5/5 in all muscle groups tested.    Special Tests: Straight leg raise and crossed SLR negative.  Leg length and pelvis level.  0 out of 5 Waddell's signs.         Skin: There are no rashes, ulcerations or lesions.   Reflexes: Reflexes are symmetrically trace-1+at the patellar and ankle tendons.  Clonus absent bilaterally at the feet.   Gait & station: forward flexed unassisted       Additional Examinations:       LOWER EXTREMITY: Inspection/examination of the right lower extremity does not show any tenderness, deformity or injury. Range of motion is full. There is no gross instability.  There are no rashes, ulcerations or lesions. Strength and tone are normal.   LEFT LOWER EXTREMITY:  Inspection/examination of the left lower extremity does not show any tenderness, deformity or injury. Range of motion is full. There is no gross instability. There are no rashes, ulcerations or lesions.  Strength and tone are normal.    Diagnostic Testing:   UDS 03/27/17 + MOR c/w hydrocodone    Updated lumbar MRI 06/23/2017 ordered by Dr. Sande Brothers  Interval postoperative changes of left laminectomy and facetectomy at L2-L3 with enhancing    granulation tissue extending into the operative defect and left ventrolateral epidural space.    There is reduced disc material in the left subarticular zone at this    level.      Multilevel degenerative disc disease and facet arthrosis as detailed, causing moderate central    canal stenosis at L2-L3 and L4-L5 with mild to moderate central canal stenosis at L3-L4.      Multilevel foraminal stenosis, with moderate bilateral L2 and L3 foraminal stenosis, moderate    left and moderate  to severe right L4 foraminal stenosis, and moderate left, severe right L5    foraminal stenosis.        His recent lumbar MRI report reviewed showing significant lumbar stenosis L2-3 and L4-5    8/4/17MRI cervical spine:   IMPRESSION]         Broad-based disc bulging/posterior osteophytic ridging with midline disc protrusions at the  C3-4 and C4-5 levels with moderate to severe narrowing of the central canal.      Facet arthropathy at C5-6 and to a lesser degree C6-7 with neural foraminal narrowings as    described.        UDS June 2016 positive for OxyIR as expected        Impression:  1) Subacute/chronic lbp, bilateral lumbar radiculitis, neurogenic claudication  2) Severe lumbar stenosis L2-3, L4 5, s/p MLL w/Dr. Sande Brothers  3) Chronic neck/trap pain w/underlying spondylosis  4) H/o prostate cancer  5) Opioid maintenance, low risk ORT=0       Plan:    1) Duragesic q72 x3 scripts, Norco 7.5/325 I po TID PRN #90 x 3 scripts---he does not feel he'll be able to further wean his pain medication at this time.  He is able to continue functioning at this dose without side effects.    2) PT for above. His son states he has been experiencing some generalized weakness     3) if symptoms worsen he could consider following up with Dr. Sande Brothers he still has some residual moderate stenosis L2-3    4) Despite higher MED he reports increased functionality with decreased pain for his chronic neck and low back issues without side effects.     5) F/u 59mo w/FCV    Controlled Substances Monitoring:     RX Monitoring 06/19/2017   Attestation The Prescription Monitoring Report for this patient was reviewed today.   Documentation Possible medication side effects, risk of tolerance/dependence & alternative treatments discussed.;No signs of potential drug abuse or diversion identified.;Obtaining appropriate analgesic effect of treatment.   Chronic Pain Treatment objectives documented - patient is progressing  appropriately.;Functional status reviewed - continues with improved or maintaining ADL's.;Reviewed the patient's functional status and documentation.;Dose reduction has been attempted.     The risks and use of maintenance opiate medication were reviewed.  These include the risk of tolerance, addition, and abuse.  Potential side effects were also discussed.   Medications are to be taken as prescribed and not escalated without prior agreement.  OARRS/KASPER reviewed & appropriate.   Opiate medications will only be prescribed through the office of Dr Carmon Ginsberg. Donato Schultz, M. D. unless notified otherwise           Dictated by Marene Lenz, PA-C, also seen & evaluated by Dr. Donato Schultz

## 2017-10-15 ENCOUNTER — Inpatient Hospital Stay: Admit: 2017-10-15 | Payer: MEDICARE | Primary: Internal Medicine

## 2017-10-15 DIAGNOSIS — M48062 Spinal stenosis, lumbar region with neurogenic claudication: Secondary | ICD-10-CM

## 2017-10-15 NOTE — Other (Signed)
[facility], [place]  [address] [place] [address]  Phone: [phone] Fax: [phone]    Physical Therapy Daily Treatment Note  Date:  2017/11/07    Patient Name:  Jordan Gutierrez    DOB:  02-04-1929  MRN: [id number]  Restrictions/Precautions:    Medical/Treatment Diagnosis Information:   Diagnosis: chronic pain syndrome, lumbar stenosis with neurogenic claudication, DDD, chronic neck pain   Treatment Diagnosis: chronic pain syndrome C89.4, lumbar stenosis with neurogenic claudication M48.062, DDD M51.36; chronic neck pain M54.2, lumbar pain M54.5  Insurance/Certification information:  PT Insurance Information: Medicare/Tricare  Physician Information:  Referring Practitioner: Wonda Cerise, PA-C  Plan of care signed (Y/N):     Date of Patient follow up with Physician:     G-Code (if applicable):      Date G-Code Applied:  Nov 07, 2017  PT G-Codes  Functional Assessment Tool Used: ODI  Score: 40% disability  Functional Limitation: Changing and maintaining body position  Changing and Maintaining Body Position Current Status (N6295): At least 40 percent but less than 60 percent impaired, limited or restricted  Changing and Maintaining Body Position Goal Status (M8413): At least 20 percent but less than 40 percent impaired, limited or restricted    Progress Note: [x]   Yes  []   No  Next due by: Visit #10      Latex Allergy:  [x] NO      [] YES  Preferred Language for Healthcare:   [x] English       [] other:    Visit # Insurance Allowable   1 Medicare/Tricare     Pain level:  0-6/10     SUBJECTIVE:  Patient reports that he was having back pain and had MRI on back which showed 2 areas of back which had issues. States he had surgery in December from Dr. Sande Brothers on back which didn't help. States that he had another MRI on back and then had 2 injections in back from Dr. Eduardo Osier- June/July. States injections only helped for a day or so. Reports that he has  most difficulty with standing for greater than 10 minutes and bending over to tend to garden. When he feels pain, he feels it across back first and then down the legs. Has not done any therapy for back before. Has been having this pain for about 6 months to a year.     OBJECTIVE: See eval  Observation:   Test measurements:      RESTRICTIONS/PRECAUTIONS: none    Exercises/Interventions:     Therapeutic Ex: 5 min sets/sec reps notes   Hip Ext      Bridge 2-1      Kneeling Alt Arm-Leg      Side lying LB rot      Front Plank      Side planks       Kneeling hip abd/ext      1/2 kneeling down chop      Std band pull down      SL hip abd/clam      Lateral band pull      Lateral band walk      Bosu Lunge      Slide lunge       Ham string stretch      Hip Flex stretch      Glute Stretch      TA 5sec 10    GS 5sec 10          Manual Intervention :  10 min      DN      Prone PA      GISTM/STM 1 5    Lumbar Manip      SI Manip      Hip belt mobs 1 5    Hip LA distraction            NMR re-education       Mf Activation- re-ed   Prone    TrA Re-ed activation   supine    Glute Max re-ed activation   Prone    Prone froggers      [place]        Therapeutic Exercise and NMR EXR  [x]  [phone]) Provided verbal/tactile cueing for activities related to strengthening, flexibility, endurance, ROM  for improvements in proximal hip and core control with self care, mobility, lifting and ambulation.  [x]  [phone]) Provided verbal/tactile cueing for activities related to improving balance, coordination, kinesthetic sense, posture, motor skill, proprioception  to assist with core control in self care, mobility, lifting, and ambulation.     Therapeutic Activities:    [x]  [phone] or [phone]) Provided verbal/tactile cueing for activities related to improving balance, coordination, kinesthetic sense, posture, motor skill, proprioception and motor activation to allow for proper function  with self care and ADLs  []  [phone]) Provided training and instruction to  the patient for proper core and proximal hip recruitment and positioning with ambulation re-education     Home Exercise Program:    [x]  (46962) Reviewed/Progressed HEP activities related to strengthening, flexibility, endurance, ROM of core, proximal hip and LE for functional self-care, mobility, lifting and ambulation   []  (95284) Reviewed/Progressed HEP activities related to improving balance, coordination, kinesthetic sense, posture, motor skill, proprioception of core, proximal hip and LE for self care, mobility, lifting, and ambulation      Manual Treatments:  PROM / STM / Oscillations-Mobs:  G-I, II, III, IV (PA's, Inf., Post.)  [x]  (97140) Provided manual therapy to mobilize proximal hip and LS spine soft tissue/joints for the purpose of modulating pain, promoting relaxation,  increasing ROM, reducing/eliminating soft tissue swelling/inflammation/restriction, improving soft tissue extensibility and allowing for proper ROM for normal function with self care, mobility, lifting and ambulation.     Modalities:       Charges:  Timed Code Treatment Minutes: 15   Total Treatment Minutes: 40     [x]  EVAL (LOW) 97161 (typically 20 minutes face-to-face)  []  EVAL (MOD) 13244 (typically 30 minutes face-to-face)  []  EVAL (HIGH) 97163 (typically 45 minutes face-to-face)  []  RE-EVAL     []  WN(02725) x      []  IONTO  []  NMR (97112) x      []  VASO  [x]  Manual (97140) x  1    []  Other:  []  TA x       []  Mech Traction (36644)  []  ES(attended) (03474)      []  ES (un) (25956):     GOALS:  Patient stated goal: improve walking/standing    Therapist goals for Patient:   Short Term Goals: To be achieved in: 2 weeks  1. Independent in HEP and progression per patient tolerance, in order to prevent re-injury.   2. Patient will have a decrease in pain to facilitate improvement in movement, function, and ADLs as indicated by Functional Deficits.    Long Term Goals: To be achieved in: 6 weeks  1. Disability index score of 25% or less for  the ODI to assist with reaching prior  level of function.   2. Patient will demonstrate increased AROM to WNL, good LS mobility, good hip ROM to allow for proper joint functioning as indicated by patients Functional Deficits.   3. Patient will demonstrate an increase in Strength to good proximal hip and core activation to allow for proper functional mobility as indicated by patients Functional Deficits.   4. Patient will return to standing/walking for at least 20 minutes without increased symptoms or restriction.     Progression Towards Functional goals:  []  Patient is progressing as expected towards functional goals listed.    []  Progression is slowed due to complexities listed.  []  Progression has been slowed due to co-morbidities.  [x]  Plan just implemented, too soon to assess goals progression  []  Other:     ASSESSMENT:  Reports no pain at conclusion    Treatment/Activity Tolerance:  [x]  Patient tolerated treatment well []  Patient limited by fatique  []  Patient limited by pain  []  Patient limited by other medical complications  []  Other:     Prognosis: [x]  Good []  Fair  []  Poor    Patient Requires Follow-up: [x]  Yes  []  No    PLAN: See eval  []  Continue per plan of care []  Alter current plan (see comments)  [x]  Plan of care initiated []  Hold pending MD visit []  Discharge    Electronically signed by: Lora PaulaStacy White

## 2017-10-15 NOTE — Plan of Care (Signed)
[facility], [place]  [address]    [place], [place] [id number]  Phone: [phone]   Fax:     [phone]                                                       Physical Therapy Certification    Dear Referring Practitioner: Wonda CeriseMegan Marie Stanfield, PA-C,    We had the pleasure of evaluating the following patient for physical therapy services at [facility].  A summary of our findings can be found in the initial assessment below.  This includes our plan of care.  If you have any questions or concerns regarding these findings, please do not hesitate to contact me at the office phone number checked above.  Thank you for the referral.       Physician Signature:_______________________________Date:__________________  By signing above (or electronic signature), therapist's plan is approved by physician      Patient: Jordan Gutierrez   DOB: December 09, 1929   MRN: [id number]  Referring Physician: Referring Practitioner: Wonda CeriseMegan Marie Stanfield, PA-C      Evaluation Date: 10/15/2017      Medical Diagnosis Information:  Diagnosis: chronic pain syndrome, lumbar stenosis with neurogenic claudication, DDD, chronic neck pain   Treatment Diagnosis: chronic pain syndrome C89.4, lumbar stenosis with neurogenic claudication M48.062, DDD M51.36; chronic neck pain M54.2, lumbar pain M54.5                                         Insurance information: PT Insurance Information: Medicare/Tricare     Precautions/ Contra-indications: n/a  Latex Allergy:  [x] NO      [] YES  Preferred Language for Healthcare:   [x] English       [] other:    SUBJECTIVE: Patient stated complaint: Patient reports that he was having back pain and had MRI on back which showed 2 areas of back which had issues. States he had surgery in December from Dr. Sande BrothersFerree on back which didn't help. States that he had another MRI on back and then had 2 injections in back from Dr. Eduardo OsierValentin- June/Gutierrez. States  injections only helped for a day or so. Reports that he has most difficulty with standing for greater than 10 minutes and bending over to tend to garden. When he feels pain, he feels it across back first and then down the legs. Has not done any therapy for back before. Has been having this pain for about 6 months to a year.     Per chart: 12/06/2016 PROCEDURES PERFORMED:   1. Bilateral L2-3 hemilaminotomy, partial facetectomy and foraminotomy   2. Microdissection using the operating room microscope.     Relevant Medical History:prior hx of lumbar surgery    Functional Disability Index/G-Codes:  PT G-Codes  Functional Assessment Tool Used: ODI  Score: 40% disability  Functional Limitation: Changing and maintaining body position  Changing and Maintaining Body Position Current Status (N6295(G8981): At least 40 percent but less than 60 percent impaired, limited or restricted  Changing and Maintaining Body Position Goal Status (M8413(G8982): At least 20 percent but less than 40 percent impaired, limited or restricted    Pain Scale: 6/10  Easing factors: sitting, resting  Provocative factors: standing longer than 10 min, bending over     Type: [x] Constant   [] Intermittent  [] Radiating [] Localized [] other:     Numbness/Tingling: none; radiates     Occupation/School: retired    Engineer, building services Level of Function: Independent with ADLs and IADLs, gardening    OBJECTIVE:     ROM  Comments   Lumbar Flex 45  With pain   Lumbar Ext 10 Increased knee flexion to compensate for lack of extension     ROM LEFT RIGHT Comments   Lumbar Side Bend 15 15 Discomfort with SB   Lumbar Rotation limited limited With pain; compensatory motion with rotation   Hip Flexion 100 105    Hip Abd 44 35    Hip ER 45 34    Hip IR 28 20    Hip Extension limited limited Stands in 15 degrees of trunk flexion   Knee Ext      Knee Flex      Hamstring Flex 70 68    Piriformis limited limited                  Strength LEFT RIGHT Comments   Multifidus      Transverse Ab       Hip Flexors 4-/5 4-/5    Hip Abductors 4-/5 3+/5 Assessed in supine   Hip Extensors 4-/5 3+/5 Assessed in supine                  Myotomes Normal Abnormal Comments   [x] ALL NORMAL            Hip flexion (L1-L2) []  []     Knee extension (L2-L4) []  []     Dorsiflexion (L4-L5) []  []     Great Toe Ext (L5) []  []     Ankle Eversion (S1-S2) []  []     Ankle PF(S1-S2) []  []       Dermatomes Normal Abnormal Comments   [x] ALL NORMAL            inguinal area (L1)  []  []     anterior mid-thigh (L2) []  []     distal ant thigh/med knee (L3) []  []     medial lower leg and foot (L4) []  []     lateral lower leg and foot (L5) []  []     posterior calf (S1) []  []     medial calcaneus (S2) []  []         Reflexes Normal Abnormal Comments               S1-2 Seated achilles []  []     S1-2 Prone knee bend []  []     L3-4 Patellar tendon []  []     C5-6 Biceps []  []     C6 Brachioradialis []  []     C7-8 Triceps []  []     Clonus []  []     Babinski []  []     Hoffman's []  []       Joint mobility: lumbar spine and hip   [] Normal    [x] Hypo   [] Hyper    Palpation: no reports of pain with palpation to hip or lumbar spine (only able to assess spine in SL- patient unable to go prone)    Functional Mobility/Transfers: limited ability to stand, walk or bend forward    Posture: increased lumbar flexion in standing to 15-20 degrees of flexion    Gait: (include devices/WB status) increased trunk flexion; decreased hip extension    Bandages/Dressings/Incisions: NA    Orthopedic Special Tests:    Neural dynamic tension testing Normal Abnormal Comments  Slump Test  - Degree of knee flexion:  []  []     SLR  [x]  []     0-30 []  []     30-70 []  []     Femoral nerve (L2-4) []  []  Unable to go prone      Normal Abnormal N/A Comments   Fwd Bend-aberrant or innominate mvmt) []  []  []     Trendelenburg []  []  []     Kemps/Quadrant []  []  []     Stork []  []  []     FABER/Patrick []  []  []     Hip scour []  []  []     Supine to sit []  []  []     Prone knee bend []  []  []            Hip thrust []  []  []     SI  distraction/compression []  []  []     Sacral [place]/thrust []  []  []                [x]  Patient history, allergies, meds reviewed. Medical chart reviewed. See intake form.     Review Of Systems (ROS):  [x] Performed Review of systems (Integumentary, CardioPulmonary, Neurological) by intake and observation. Intake form has been scanned into medical record. Patient has been instructed to contact their primary care physician regarding ROS issues if not already being addressed at this time.      Co-morbidities/Complexities (which will affect course of rehabilitation):   [] None           Arthritic conditions   [] Rheumatoid arthritis (M05.9)  [] Osteoarthritis (M19.91)   Cardiovascular conditions   [] Hypertension (I10)  [] Hyperlipidemia (E78.5)  [] Angina pectoris (I20)  [] Atherosclerosis (I70)  [] CVA Musculoskeletal conditions   [] Disc pathology   [] Congenital spine pathologies   [] Prior surgical intervention  [] Osteoporosis (M81.8)  [] Osteopenia (M85.8)   Endocrine conditions   [] Hypothyroid (E03.9)  [] Hyperthyroid Gastrointestinal conditions   [] Constipation (K59.00)   Metabolic conditions   [] Morbid obesity (E66.01)  [x] Diabetes type 1(E10.65) or 2 (E11.65)   [] Neuropathy (G60.9)     Pulmonary conditions   [] Asthma (J45)  [] Coughing   [] COPD (J44.9)   Psychological Disorders  [] Anxiety (F41.9)  [] Depression (F32.9)   [] Other:   [] Other:           Barriers to/and or personal factors that will affect rehab potential:              [x] Age  [] Sex    [] Smoker              [] Motivation/Lack of Motivation                        [] Co-Morbidities              [] Cognitive Function, education/learning barriers              [] Environmental, home barriers              [] profession/work barriers  [] past PT/medical experience  [] other:  Justification:     Falls Risk Assessment (30 days): none  [x]  Falls Risk assessed and no intervention required.  []  Falls Risk assessed and Patient requires intervention due to being higher risk   TUG score  (>12s at risk):     []  Falls education provided, including       G-Codes:  PT G-Codes  Functional Assessment Tool Used: ODI  Score: 40% disability  Functional Limitation: Changing and maintaining body position  Changing and Maintaining Body Position Current Status (Z6109): At least 40 percent but less than 60 percent impaired,  limited or restricted  Changing and Maintaining Body Position Goal Status [phone]): At least 20 percent but less than 40 percent impaired, limited or restricted    ASSESSMENT: Patient is an 83 yo male who presents to therapy with reports of low back pain which radiates into legs bilaterally with prolonged standing and walking greater than 10 minutes and bending forward for gardening. Upon assessment, patient with decreased lumbar and hip mobility, as well as decreased soft tissue mobility. Patient tolerated light activation of glutes and TA; as well as hip belt mobilization. Patient will benefit from continued skilled PT services to address deficits associated with lumbar spine and hip.     Functional Impairments:     [x] Noted lumbar/proximal hip hypomobility   [] Noted lumbosacral and/or generalized hypermobility   [x] Decreased Lumbosacral/hip/LE functional ROM   [x] Decreased core/proximal hip strength and neuromuscular control    [x] Decreased LE functional strength    [] Abnormal reflexes/sensation/myotomal/dermatomal deficits  [] Reduced balance/proprioceptive control    [] other:      Functional Activity Limitations (from functional questionnaire and intake)   [x] Reduced ability to tolerate prolonged functional positions   [x] Reduced ability or difficulty with changes of positions or transfers between positions   [] Reduced ability to maintain good posture and demonstrate good body mechanics with sitting, bending, and lifting   [] Reduced ability to sleep   []  Reduced ability or tolerance with driving and/or computer work   [] Reduced ability to perform lifting, reaching, carrying  tasks   [] Reduced ability to squat   [x] Reduced ability to forward bend   [x] Reduced ability to ambulate prolonged functional periods/distances/surfaces   [] Reduced ability to ascend/descend stairs   [] other:       Participation Restrictions   [x] Reduced participation in self care activities   [x] Reduced participation in home management activities   [] Reduced participation in work activities   [] Reduced participation in social activities.   [] Reduced participation in sport/recreational activities.    Classification:   [x] Signs/symptoms consistent with Lumbar instability/stabilization subgroup.      [] Signs/symptoms consistent with Lumbar mobilization/manipulation subgroup, myotomes and dermatomes intact. Meets manipulation criteria.    [x] Signs/symptoms consistent with Lumbar direction specific/centralization subgroup   [] Signs/symptoms consistent with Lumbar traction subgroup       [] Signs/symptoms consistent with lumbar facet dysfunction   [x] Signs/symptoms consistent with lumbar stenosis type dysfunction   [] Signs/symptoms consistent with nerve root involvement including myotome & dermatome dysfunction   [] Signs/symptoms consistent with post-surgical status including: decreased ROM, strength and function.   [x] signs/symptoms consistent with pathology which may benefit from Dry needling     [] other:      Prognosis/Rehab Potential:      [] Excellent   [x] Good    [] Fair   [] Poor    Tolerance of evaluation/treatment:    [] Excellent   [x] Good    [] Fair   [] Poor     Physical Therapy Evaluation Complexity Justification  [x]  A history of present problem with:  [x]  no personal factors and/or comorbidities that impact the plan of care;  [] 1-2 personal factors and/or comorbidities that impact the plan of care  [] 3 personal factors and/or comorbidities that impact the plan of care  [x]  An examination of body systems using standardized tests and measures addressing any of the following: body structures and functions  (impairments), activity limitations, and/or participation restrictions;:  [x]  a total of 1-2 or more elements   [x]  a total of 3 or more elements   []  a total of 4 or more elements   [x]  A clinical presentation  with:  [x]  stable and/or uncomplicated characteristics   []  evolving clinical presentation with changing characteristics  []  unstable and unpredictable characteristics;   [x]  Clinical decision making of [x]  low, []  moderate, []  high complexity using standardized patient assessment instrument and/or measurable assessment of functional outcome.    [x]  EVAL (LOW) 97161 (typically 20 minutes face-to-face)  []  EVAL (MOD) 16109 (typically 30 minutes face-to-face)  []  EVAL (HIGH) 97163 (typically 45 minutes face-to-face)  []  RE-EVAL     PLAN: Begin PT focusing on: proximal hip mobilizations, LB mobs, LB core activation, proximal hip activation, and HEP    Frequency/Duration:  2 days per week for 6 Weeks:  Interventions:  [x]   Therapeutic exercise including: strength training, ROM, for LE, Glutes and core   [x]   NMR activation and proprioception for glutes , LE and Core   [x]   Manual therapy as indicated for Hip complex, LE and spine to include: Dry Needling/IASTM, STM, PROM, Gr I-IV mobilizations, manipulation.   [x]   Modalities as needed that may include: thermal agents, E-stim, Biofeedback, [place], iontophoresis as indicated  [x]   Patient education on joint protection, postural re-education, activity modification, progression of HEP.    HEP instruction: TA and glute sets (see scanned forms)    GOALS:  Patient stated goal: improve walking/standing    Therapist goals for Patient:   Short Term Goals: To be achieved in: 2 weeks  1. Independent in HEP and progression per patient tolerance, in order to prevent re-injury.   2. Patient will have a decrease in pain to facilitate improvement in movement, function, and ADLs as indicated by Functional Deficits.    Long Term Goals: To be achieved in: 6 weeks  1. Disability index  score of 25% or less for the ODI to assist with reaching prior level of function.   2. Patient will demonstrate increased AROM to WNL, good LS mobility, good hip ROM to allow for proper joint functioning as indicated by patients Functional Deficits.   3. Patient will demonstrate an increase in Strength to good proximal hip and core activation to allow for proper functional mobility as indicated by patients Functional Deficits.   4. Patient will return to standing/walking for at least 20 minutes without increased symptoms or restriction.        Electronically signed by:  Lora Paula

## 2017-10-21 ENCOUNTER — Encounter: Payer: MEDICARE | Primary: Internal Medicine

## 2017-10-23 ENCOUNTER — Encounter: Payer: MEDICARE | Primary: Internal Medicine

## 2017-10-28 ENCOUNTER — Inpatient Hospital Stay: Admit: 2017-10-28 | Payer: MEDICARE | Primary: Internal Medicine

## 2017-10-28 NOTE — Other (Signed)
[facility] - [facility], [place]  [address], [place] [address]  Phone: [phone] Fax: [phone]    Physical Therapy Daily Treatment Note  Date:  10/28/2017    Patient Name:  Jordan Gutierrez    DOB:  1929/08/29  MRN: [id number]  Restrictions/Precautions:    Medical/Treatment Diagnosis Information:   Diagnosis: chronic pain syndrome, lumbar stenosis with neurogenic claudication, DDD, chronic neck pain   Treatment Diagnosis: chronic pain syndrome C89.4, lumbar stenosis with neurogenic claudication M48.062, DDD M51.36; chronic neck pain M54.2, lumbar pain M54.5  Insurance/Certification information:  PT Insurance Information: Medicare/Tricare  Physician Information:  Referring Practitioner: Wonda Cerise, PA-C  Plan of care signed (Y/N):     Date of Patient follow up with Physician:     G-Code (if applicable):      Date G-Code Applied:  10/15/2017       Progress Note: [x]   Yes  []   No  Next due by: Visit #10      Latex Allergy:  [x] NO      [] YES  Preferred Language for Healthcare:   [x] English       [] other:    Visit # Insurance Allowable   2 Medicare/Tricare     Pain level:  3-4/10     SUBJECTIVE:  Patient reports that he is having pain in back today with pain down to B knees. Reports that he has been able to do some of the exercises at home.        OBJECTIVE: See eval  Observation:   Test measurements:      RESTRICTIONS/PRECAUTIONS: none    Exercises/Interventions:     Therapeutic Ex: 28 min sets/sec reps notes   Hip Ext      Bridge 2-1 1 15     Kneeling Alt Arm-Leg      Side lying LB rot      Front Plank      Side planks       Kneeling hip abd/ext      1/2 kneeling down chop      Std band pull down      SL hip abd/clam      Lateral band pull      Lateral band walk      Bosu Lunge      Slide lunge       STS (elevated table ht) 1 15    Backward walking at table 1 10    POT hip extension 1 15 bilat   TA 5sec 10    GS 5sec 10    LTR 1 15    Manual Intervention  :15 min      DN      Prone PA      GISTM/STM      Lumbar Manip      SI Manip      Hip belt mobs 1 15 bilat   Hip LA distraction            NMR re-education       Mf Activation- re-ed   Prone    TrA Re-ed activation   supine    Glute Max re-ed activation   Prone    Prone froggers      [place]        Therapeutic Exercise and NMR EXR  [x]  [phone]) Provided verbal/tactile cueing for activities related to strengthening, flexibility, endurance, ROM  for improvements in proximal hip and core control with self care, mobility,  lifting and ambulation.  [x]  [phone](97112) Provided verbal/tactile cueing for activities related to improving balance, coordination, kinesthetic sense, posture, motor skill, proprioception  to assist with core control in self care, mobility, lifting, and ambulation.     Therapeutic Activities:    [x]  [phone](97112 or [phone]) Provided verbal/tactile cueing for activities related to improving balance, coordination, kinesthetic sense, posture, motor skill, proprioception and motor activation to allow for proper function  with self care and ADLs  []  [phone](97116) Provided training and instruction to the patient for proper core and proximal hip recruitment and positioning with ambulation re-education     Home Exercise Program:    [x]  (21308(97110) Reviewed/Progressed HEP activities related to strengthening, flexibility, endurance, ROM of core, proximal hip and LE for functional self-care, mobility, lifting and ambulation   []  (65784(97112) Reviewed/Progressed HEP activities related to improving balance, coordination, kinesthetic sense, posture, motor skill, proprioception of core, proximal hip and LE for self care, mobility, lifting, and ambulation      Manual Treatments:  PROM / STM / Oscillations-Mobs:  G-I, II, III, IV (PA's, Inf., Post.)  [x]  (97140) Provided manual therapy to mobilize proximal hip and LS spine soft tissue/joints for the purpose of modulating pain, promoting relaxation,  increasing ROM, reducing/eliminating soft tissue  swelling/inflammation/restriction, improving soft tissue extensibility and allowing for proper ROM for normal function with self care, mobility, lifting and ambulation.     Modalities:       Charges:  Timed Code Treatment Minutes: 43   Total Treatment Minutes: 45     []  EVAL (LOW) 97161 (typically 20 minutes face-to-face)  []  EVAL (MOD) [id number] (typically 30 minutes face-to-face)  []  EVAL (HIGH) 97163 (typically 45 minutes face-to-face)  []  RE-EVAL     [x]  BM([id number](97110) x  2   []  IONTO  []  NMR (97112) x      []  VASO  [x]  Manual (97140) x  1    []  Other:  []  TA x       []  Mech Traction (44010(97012)  []  ES(attended) (27253(97032)      []  ES (un) (66440(97014):     GOALS:  Patient stated goal: improve walking/standing    Therapist goals for Patient:   Short Term Goals: To be achieved in: 2 weeks  1. Independent in HEP and progression per patient tolerance, in order to prevent re-injury.   2. Patient will have a decrease in pain to facilitate improvement in movement, function, and ADLs as indicated by Functional Deficits.    Long Term Goals: To be achieved in: 6 weeks  1. Disability index score of 25% or less for the ODI to assist with reaching prior level of function.   2. Patient will demonstrate increased AROM to WNL, good LS mobility, good hip ROM to allow for proper joint functioning as indicated by patients Functional Deficits.   3. Patient will demonstrate an increase in Strength to good proximal hip and core activation to allow for proper functional mobility as indicated by patients Functional Deficits.   4. Patient will return to standing/walking for at least 20 minutes without increased symptoms or restriction.     Progression Towards Functional goals:  [x]  Patient is progressing as expected towards functional goals listed.    []  Progression is slowed due to complexities listed.  []  Progression has been slowed due to co-morbidities.  []  Plan just implemented, too soon to assess goals progression  []  Other:     ASSESSMENT:  Patient  reporting no pain in legs at conclusion; only  noting discomfort into low back. Patient did well with exercises and encouraged to perform further activation of glutes. Provided education for working on retro walking at home and STS.     Treatment/Activity Tolerance:  [x]  Patient tolerated treatment well []  Patient limited by fatique  []  Patient limited by pain  []  Patient limited by other medical complications  []  Other:     Prognosis: [x]  Good []  Fair  []  Poor    Patient Requires Follow-up: [x]  Yes  []  No    PLAN: See eval  [x]  Continue per plan of care []  Alter current plan (see comments)  []  Plan of care initiated []  Hold pending MD visit []  Discharge    Electronically signed by: Lora Paula

## 2017-10-30 ENCOUNTER — Inpatient Hospital Stay: Admit: 2017-10-30 | Payer: MEDICARE | Primary: Internal Medicine

## 2017-10-30 DIAGNOSIS — M5136 Other intervertebral disc degeneration, lumbar region: Secondary | ICD-10-CM

## 2017-10-30 NOTE — Other (Signed)
[facility] - [facility], [place]  [address], [place] [address]  Phone: [phone] Fax: [phone]    Physical Therapy Daily Treatment Note  Date:  10/30/2017    Patient Name:  Jordan Gutierrez    DOB:  [birth year]  MRN: [id number]  Restrictions/Precautions:    Medical/Treatment Diagnosis Information:   Diagnosis: chronic pain syndrome, lumbar stenosis with neurogenic claudication, DDD, chronic neck pain   Treatment Diagnosis: chronic pain syndrome C89.4, lumbar stenosis with neurogenic claudication M48.062, DDD M51.36; chronic neck pain M54.2, lumbar pain M54.5  Insurance/Certification information:  PT Insurance Information: Medicare/Tricare  Physician Information:  Referring Practitioner: Wonda Cerise, PA-C  Plan of care signed (Y/N):     Date of Patient follow up with Physician:     G-Code (if applicable):      Date G-Code Applied:  10/15/2017       Progress Note: [x]   Yes  []   No  Next due by: Visit #10      Latex Allergy:  [x] NO      [] YES  Preferred Language for Healthcare:   [x] English       [] other:    Visit # Insurance Allowable   3 Medicare/Tricare     Pain level:  3-4/10     SUBJECTIVE:  Patient reports that he was having less leg pain after last session and into yesterday. Came back slightly into legs. Pain in legs is not as intense as what it was.      OBJECTIVE: See eval  Observation:   Test measurements:      RESTRICTIONS/PRECAUTIONS: none    Exercises/Interventions:     Therapeutic Ex: 28 min sets/sec reps notes   Hip Ext      Bridge 2-1 1 15     Kneeling Alt Arm-Leg      Side lying LB rot      Front Plank      Side planks       Kneeling hip abd/ext      1/2 kneeling down chop      Std band pull down      SL hip abd/clam      Lateral band pull      Lateral band walk      Bosu Lunge      Slide lunge       STS (elevated table ht) 2 12 Cane for eccentric assist   Backward walking at table 1 10    POT hip extension 1 15 bilat   TA 5sec 10    GS  5sec 10    LTR 1 15    Manual Intervention :15 min      DN      Prone PA      GISTM/STM      Lumbar Manip      SI Manip      Hip belt mobs 1 15 Lateral and inferior bilat   Hip LA distraction            NMR re-education       Mf Activation- re-ed   Prone    TrA Re-ed activation   supine    Glute Max re-ed activation   Prone    Prone froggers      [place]        Therapeutic Exercise and NMR EXR  [x]  (97110) Provided verbal/tactile cueing for activities related to strengthening, flexibility, endurance, ROM  for improvements in proximal hip and core  control with self care, mobility, lifting and ambulation.  [x]  [phone](97112) Provided verbal/tactile cueing for activities related to improving balance, coordination, kinesthetic sense, posture, motor skill, proprioception  to assist with core control in self care, mobility, lifting, and ambulation.     Therapeutic Activities:    [x]  [phone](97112 or [phone]) Provided verbal/tactile cueing for activities related to improving balance, coordination, kinesthetic sense, posture, motor skill, proprioception and motor activation to allow for proper function  with self care and ADLs  []  [phone](97116) Provided training and instruction to the patient for proper core and proximal hip recruitment and positioning with ambulation re-education     Home Exercise Program:    [x]  (52841(97110) Reviewed/Progressed HEP activities related to strengthening, flexibility, endurance, ROM of core, proximal hip and LE for functional self-care, mobility, lifting and ambulation   []  (32440(97112) Reviewed/Progressed HEP activities related to improving balance, coordination, kinesthetic sense, posture, motor skill, proprioception of core, proximal hip and LE for self care, mobility, lifting, and ambulation      Manual Treatments:  PROM / STM / Oscillations-Mobs:  G-I, II, III, IV (PA's, Inf., Post.)  [x]  (97140) Provided manual therapy to mobilize proximal hip and LS spine soft tissue/joints for the purpose of modulating pain,  promoting relaxation,  increasing ROM, reducing/eliminating soft tissue swelling/inflammation/restriction, improving soft tissue extensibility and allowing for proper ROM for normal function with self care, mobility, lifting and ambulation.     Modalities:       Charges:  Timed Code Treatment Minutes: 43   Total Treatment Minutes: 45     []  EVAL (LOW) 97161 (typically 20 minutes face-to-face)  []  EVAL (MOD) [id number] (typically 30 minutes face-to-face)  []  EVAL (HIGH) 97163 (typically 45 minutes face-to-face)  []  RE-EVAL     [x]  [id number](97110) x  2   []  IONTO  []  NMR (97112) x      []  VASO  [x]  Manual (97140) x  1    []  Other:  []  TA x       []  Mech T[employer] (34742(97012)  []  ES(attended) (59563(97032)      []  ES (un) (87564(97014):     GOALS:  Patient stated goal: improve walking/standing    Therapist goals for Patient:   Short Term Goals: To be achieved in: 2 weeks  1. Independent in HEP and progression per patient tolerance, in order to prevent re-injury.   2. Patient will have a decrease in pain to facilitate improvement in movement, function, and ADLs as indicated by Functional Deficits.    Long Term Goals: To be achieved in: 6 weeks  1. Disability index score of 25% or less for the ODI to assist with reaching prior level of function.   2. Patient will demonstrate increased AROM to WNL, good LS mobility, good hip ROM to allow for proper joint functioning as indicated by patients Functional Deficits.   3. Patient will demonstrate an increase in Strength to good proximal hip and core activation to allow for proper functional mobility as indicated by patients Functional Deficits.   4. Patient will return to standing/walking for at least 20 minutes without increased symptoms or restriction.     Progression Towards Functional goals:  [x]  Patient is progressing as expected towards functional goals listed.    []  Progression is slowed due to complexities listed.  []  Progression has been slowed due to co-morbidities.  []  Plan just implemented,  too soon to assess goals progression  []  Other:     ASSESSMENT:  Patient reporting no pain  in legs at conclusion; no discomfort into low back. Discussed stenosis and progression with therapy.  Patient did well with exercises and encouraged to perform further activation of glutes. Provided education for working on retro walking at home and STS.     Treatment/Activity Tolerance:  [x]  Patient tolerated treatment well []  Patient limited by fatique  []  Patient limited by pain  []  Patient limited by other medical complications  []  Other:     Prognosis: [x]  Good []  Fair  []  Poor    Patient Requires Follow-up: [x]  Yes  []  No    PLAN: See eval  [x]  Continue per plan of care []  Alter current plan (see comments)  []  Plan of care initiated []  Hold pending MD visit []  Discharge    Electronically signed by: Lora Paula

## 2017-11-04 ENCOUNTER — Inpatient Hospital Stay: Admit: 2017-11-04 | Payer: MEDICARE | Primary: Internal Medicine

## 2017-11-04 NOTE — Other (Signed)
[facility] - [facility], [place]  [address], [place] [address]  Phone: [phone] Fax: [phone]    Physical Therapy Daily Treatment Note  Date:  11/04/2017    Patient Name:  Jordan Gutierrez    DOB:  03/17/1929  MRN: [id number]  Restrictions/Precautions:    Medical/Treatment Diagnosis Information:   Diagnosis: chronic pain syndrome, lumbar stenosis with neurogenic claudication, DDD, chronic neck pain   Treatment Diagnosis: chronic pain syndrome C89.4, lumbar stenosis with neurogenic claudication M48.062, DDD M51.36; chronic neck pain M54.2, lumbar pain M54.5  Insurance/Certification information:  PT Insurance Information: Medicare/Tricare  Physician Information:  Referring Practitioner: Wonda Cerise, PA-C  Plan of care signed (Y/N):     Date of Patient follow up with Physician:     G-Code (if applicable):      Date G-Code Applied:  10/15/2017       Progress Note: [x]   Yes  []   No  Next due by: Visit #10      Latex Allergy:  [x] NO      [] YES  Preferred Language for Healthcare:   [x] English       [] other:    Visit # Insurance Allowable   4 Medicare/Tricare     Pain level:  3/10     SUBJECTIVE:  Patient reports that he has only had pain in the back of the legs once since last session. Having some soreness into quads in both legs.     OBJECTIVE: See eval  Observation:   Test measurements:      RESTRICTIONS/PRECAUTIONS: none    Exercises/Interventions:     Therapeutic Ex: 30 min sets/sec reps notes   Bike 1 6    Bridge 2-1 1 15     Kneeling Alt Arm-Leg      SLR 1 10    Front Plank      Side planks       Kneeling hip abd/ext      1/2 kneeling down chop      Std band pull down      SL hip abd/clam      Lateral band pull      Lateral band walk      Bosu Lunge      Slide lunge       STS (elevated table ht) 2 12 Cane for eccentric assist   Backward walking at table 1 10    POT hip extension 1 20 bilat   Side stepping 1 10    GS 5sec 15    LTR 1 15    Manual  Intervention :15 min      DN      Prone PA      GISTM/STM      Lumbar Manip      SI Manip      Hip belt mobs 1 15 Lateral and inferior bilat   Hip LA distraction            NMR re-education       Mf Activation- re-ed   Prone    TrA Re-ed activation   supine    Glute Max re-ed activation   Prone    Prone froggers      [place]        Therapeutic Exercise and NMR EXR  [x]  [phone]) Provided verbal/tactile cueing for activities related to strengthening, flexibility, endurance, ROM  for improvements in proximal hip and core control with self care, mobility, lifting and ambulation.  [  x] [phone](97112) Provided verbal/tactile cueing for activities related to improving balance, coordination, kinesthetic sense, posture, motor skill, proprioception  to assist with core control in self care, mobility, lifting, and ambulation.     Therapeutic Activities:    [x]  [phone](97112 or [phone]) Provided verbal/tactile cueing for activities related to improving balance, coordination, kinesthetic sense, posture, motor skill, proprioception and motor activation to allow for proper function  with self care and ADLs  []  [phone](97116) Provided training and instruction to the patient for proper core and proximal hip recruitment and positioning with ambulation re-education     Home Exercise Program:    [x]  (44010(97110) Reviewed/Progressed HEP activities related to strengthening, flexibility, endurance, ROM of core, proximal hip and LE for functional self-care, mobility, lifting and ambulation   []  (27253(97112) Reviewed/Progressed HEP activities related to improving balance, coordination, kinesthetic sense, posture, motor skill, proprioception of core, proximal hip and LE for self care, mobility, lifting, and ambulation      Manual Treatments:  PROM / STM / Oscillations-Mobs:  G-I, II, III, IV (PA's, Inf., Post.)  [x]  (97140) Provided manual therapy to mobilize proximal hip and LS spine soft tissue/joints for the purpose of modulating pain, promoting relaxation,  increasing ROM,  reducing/eliminating soft tissue swelling/inflammation/restriction, improving soft tissue extensibility and allowing for proper ROM for normal function with self care, mobility, lifting and ambulation.     Modalities:       Charges:  Timed Code Treatment Minutes: 45   Total Treatment Minutes: 46     []  EVAL (LOW) 97161 (typically 20 minutes face-to-face)  []  EVAL (MOD) [id number] (typically 30 minutes face-to-face)  []  EVAL (HIGH) 97163 (typically 45 minutes face-to-face)  []  RE-EVAL     [x]  HK(74259TE(97110) x  2   []  IONTO  []  NMR (97112) x      []  VASO  [x]  Manual (97140) x  1    []  Other:  []  TA x       []  Mech [employer] (56387(97012)  []  ES(attended) (56433(97032)      []  ES (un) (29518(97014):     GOALS:  Patient stated goal: improve walking/standing    Therapist goals for Patient:   Short Term Goals: To be achieved in: 2 weeks  1. Independent in HEP and progression per patient tolerance, in order to prevent re-injury.   2. Patient will have a decrease in pain to facilitate improvement in movement, function, and ADLs as indicated by Functional Deficits.    Long Term Goals: To be achieved in: 6 weeks  1. Disability index score of 25% or less for the ODI to assist with reaching prior level of function.   2. Patient will demonstrate increased AROM to WNL, good LS mobility, good hip ROM to allow for proper joint functioning as indicated by patients Functional Deficits.   3. Patient will demonstrate an increase in Strength to good proximal hip and core activation to allow for proper functional mobility as indicated by patients Functional Deficits.   4. Patient will return to standing/walking for at least 20 minutes without increased symptoms or restriction.     Progression Towards Functional goals:  [x]  Patient is progressing as expected towards functional goals listed.    []  Progression is slowed due to complexities listed.  []  Progression has been slowed due to co-morbidities.  []  Plan just implemented, too soon to assess goals progression  []   Other:     ASSESSMENT:  Continued limitation in hip IR initially- improved quickly with mobilizations. Patient tolerated increased  strengthening exercises today. Patient reporting no pain in legs at conclusion; overall improving posture and less forward flexion.       Treatment/Activity Tolerance:  [x]  Patient tolerated treatment well []  Patient limited by fatique  []  Patient limited by pain  []  Patient limited by other medical complications  []  Other:     Prognosis: [x]  Good []  Fair  []  Poor    Patient Requires Follow-up: [x]  Yes  []  No    PLAN: See eval  [x]  Continue per plan of care []  Alter current plan (see comments)  []  Plan of care initiated []  Hold pending MD visit []  Discharge    Electronically signed by: Lora PaulaStacy White

## 2017-11-06 ENCOUNTER — Inpatient Hospital Stay: Admit: 2017-11-06 | Payer: MEDICARE | Primary: Internal Medicine

## 2017-11-06 NOTE — Other (Signed)
[facility], [place]  [address] [place], [place] [address]  Phone: [phone] Fax: [phone]    Physical Therapy Daily Treatment Note  Date:  11/06/2017    Patient Name:  Jordan Gutierrez    DOB:  [birth year]  MRN: [id number]  Restrictions/Precautions:    Medical/Treatment Diagnosis Information:   Diagnosis: chronic pain syndrome, lumbar stenosis with neurogenic claudication, DDD, chronic neck pain   Treatment Diagnosis: chronic pain syndrome C89.4, lumbar stenosis with neurogenic claudication M48.062, DDD M51.36; chronic neck pain M54.2, lumbar pain M54.5  Insurance/Certification information:  PT Insurance Information: Medicare/Tricare  Physician Information:  Referring Practitioner: Wonda CeriseMegan Marie Stanfield, PA-C  Plan of care signed (Y/N):     Date of Patient follow up with Physician:     G-Code (if applicable):      Date G-Code Applied:  10/15/2017       Progress Note: [x]   Yes  []   No  Next due by: Visit #10      Latex Allergy:  [x] NO      [] YES  Preferred Language for Healthcare:   [x] English       [] other:    Visit # Insurance Allowable   5 Medicare/Tricare     Pain level:  3/10     SUBJECTIVE:  Patient reports that he has not had any pain in his legs since last session. Having only mild muscle soreness in legs from exercises.     OBJECTIVE: See eval  Observation:   Test measurements:      RESTRICTIONS/PRECAUTIONS: none    Exercises/Interventions:     Therapeutic Ex: 30 min sets/sec reps notes   Bike 1 6    Bridge 2-1 1 20     Kneeling Alt Arm-Leg      SLR 2 10    Hooklying hip abd 2 10 blue   Side planks       Kneeling hip abd/ext      1/2 kneeling down chop      Std band pull down      SL hip abd/clam      Lateral band pull      Lateral band walk      Bosu Lunge      SLS 10sec 10 bilat   STS (elevated table ht) 2 12 Cane for eccentric assist   Backward walking at table 1 10    POT hip extension 1 20 bilat   Side stepping 1 10    GS 5sec 15    LTR 1 15     Manual Intervention :15 min      DN      Prone PA      GISTM/STM      Lumbar Manip      SI Manip      Hip belt mobs 1 15 Lateral and inferior bilat   Hip LA distraction            NMR re-education       Mf Activation- re-ed   Prone    TrA Re-ed activation   supine    Glute Max re-ed activation   Prone    Prone froggers      [place]        Therapeutic Exercise and NMR EXR  [x]  [phone](97110) Provided verbal/tactile cueing for activities related to strengthening, flexibility, endurance, ROM  for improvements in proximal hip and core control with self care, mobility, lifting and ambulation.  [x]  [phone](97112) Provided  verbal/tactile cueing for activities related to improving balance, coordination, kinesthetic sense, posture, motor skill, proprioception  to assist with core control in self care, mobility, lifting, and ambulation.     Therapeutic Activities:    [x]  [phone] or 62130) Provided verbal/tactile cueing for activities related to improving balance, coordination, kinesthetic sense, posture, motor skill, proprioception and motor activation to allow for proper function  with self care and ADLs  []  [phone]) Provided training and instruction to the patient for proper core and proximal hip recruitment and positioning with ambulation re-education     Home Exercise Program:    [x]  (46962) Reviewed/Progressed HEP activities related to strengthening, flexibility, endurance, ROM of core, proximal hip and LE for functional self-care, mobility, lifting and ambulation   []  (95284) Reviewed/Progressed HEP activities related to improving balance, coordination, kinesthetic sense, posture, motor skill, proprioception of core, proximal hip and LE for self care, mobility, lifting, and ambulation      Manual Treatments:  PROM / STM / Oscillations-Mobs:  G-I, II, III, IV (PA's, Inf., Post.)  [x]  (97140) Provided manual therapy to mobilize proximal hip and LS spine soft tissue/joints for the purpose of modulating pain, promoting relaxation,   increasing ROM, reducing/eliminating soft tissue swelling/inflammation/restriction, improving soft tissue extensibility and allowing for proper ROM for normal function with self care, mobility, lifting and ambulation.     Modalities:       Charges:  Timed Code Treatment Minutes: 45   Total Treatment Minutes: 46     []  EVAL (LOW) 97161 (typically 20 minutes face-to-face)  []  EVAL (MOD) 13244 (typically 30 minutes face-to-face)  []  EVAL (HIGH) 97163 (typically 45 minutes face-to-face)  []  RE-EVAL     [x]  WN(02725) x  2   []  IONTO  []  NMR (97112) x      []  VASO  [x]  Manual (97140) x  1    []  Other:  []  TA x       []  Mech Traction (36644)  []  ES(attended) (03474)      []  ES (un) (25956):     GOALS:  Patient stated goal: improve walking/standing    Therapist goals for Patient:   Short Term Goals: To be achieved in: 2 weeks  1. Independent in HEP and progression per patient tolerance, in order to prevent re-injury.   2. Patient will have a decrease in pain to facilitate improvement in movement, function, and ADLs as indicated by Functional Deficits.    Long Term Goals: To be achieved in: 6 weeks  1. Disability index score of 25% or less for the ODI to assist with reaching prior level of function.   2. Patient will demonstrate increased AROM to WNL, good LS mobility, good hip ROM to allow for proper joint functioning as indicated by patients Functional Deficits.   3. Patient will demonstrate an increase in Strength to good proximal hip and core activation to allow for proper functional mobility as indicated by patients Functional Deficits.   4. Patient will return to standing/walking for at least 20 minutes without increased symptoms or restriction.     Progression Towards Functional goals:  [x]  Patient is progressing as expected towards functional goals listed.    []  Progression is slowed due to complexities listed.  []  Progression has been slowed due to co-morbidities.  []  Plan just implemented, too soon to assess goals  progression  []  Other:     ASSESSMENT:  Mild limitation in R hip only today- did well with mobilization. Did well with all strengthening  exercises today. Patient reporting no pain in legs at conclusion; overall improving posture and less forward flexion.       Treatment/Activity Tolerance:  [x]  Patient tolerated treatment well []  Patient limited by fatique  []  Patient limited by pain  []  Patient limited by other medical complications  []  Other:     Prognosis: [x]  Good []  Fair  []  Poor    Patient Requires Follow-up: [x]  Yes  []  No    PLAN: See eval  [x]  Continue per plan of care []  Alter current plan (see comments)  []  Plan of care initiated []  Hold pending MD visit []  Discharge    Electronically signed by: Lora PaulaStacy White

## 2017-11-11 ENCOUNTER — Inpatient Hospital Stay: Admit: 2017-11-11 | Payer: MEDICARE | Primary: Internal Medicine

## 2017-11-11 NOTE — Other (Signed)
[facility] - [facility]opa[facility]ics and [facility] Rehabilitation, [place]  [address] [place] [address]  Phone: [phone] Fax: [phone]    Physical Therapy Daily Treatment Note  Date:  11/11/2017    Patient Name:  Jordan Gutierrez    DOB:  1929/02/10  MRN: [id number]  Restrictions/Precautions:    Medical/Treatment Diagnosis Information:   Diagnosis: chronic pain syndrome, lumbar stenosis with neurogenic claudication, DDD, chronic neck pain   Treatment Diagnosis: chronic pain syndrome C89.4, lumbar stenosis with neurogenic claudication M48.062, DDD M51.36; chronic neck pain M54.2, lumbar pain M54.5  Insurance/Certification information:  PT Insurance Information: Medicare/Tricare  Physician Information:  Referring Practitioner: Wonda Cerise, PA-C  Plan of care signed (Y/N):     Date of Patient follow up with Physician:     G-Code (if applicable):      Date G-Code Applied:  10/15/2017       Progress Note: [x]   Yes  []   No  Next due by: Visit #10      Latex Allergy:  [x] NO      [] YES  Preferred Language for Healthcare:   [x] English       [] other:    Visit # Insurance Allowable   6 Medicare/Tricare     Pain level:  2/10     SUBJECTIVE:  Patient reports that he continues to feel better; feels like therapy is helping. States that he had a bout of his back hurting on Saturday- but did not last.  Less soreness in muscles in leg.     OBJECTIVE: See eval  Observation:   Test measurements:      RESTRICTIONS/PRECAUTIONS: none    Exercises/Interventions:     Therapeutic Ex: 35 min sets/sec reps notes   Bike 1 5    Bridge 2-1 1 20     Kneeling Alt Arm-Leg      SLR 2 10    Hooklying hip abd 2 10 blue   Side planks       Kneeling hip abd/ext      1/2 kneeling down chop      Std band pull down 2 10 red   SL hip abd/clam      Lateral band pull      Lateral band walk      Bosu Lunge      SLS 10sec 10 bilat   STS (elevated table ht) 2 12 Cane for eccentric assist   Backward walking at table 1 10    POT hip  extension 1 20 bilat   Side stepping 1 10    Wall squats 1 15    LTR 1 15    Manual Intervention :13 min      DN      Prone PA      GISTM/STM      Lumbar Manip      SI Manip      Hip belt mobs 1 13  bilat   Hip LA distraction            NMR re-education       Mf Activation- re-ed   Prone    TrA Re-ed activation   supine    Glute Max re-ed activation   Prone    Prone froggers      [place]        Therapeutic Exercise and NMR EXR  [x]  [phone]) Provided verbal/tactile cueing for activities related to strengthening, flexibility, endurance, ROM  for improvements in proximal hip and core control  with self care, mobility, lifting and ambulation.  [x]  [phone](97112) Provided verbal/tactile cueing for activities related to improving balance, coordination, kinesthetic sense, posture, motor skill, proprioception  to assist with core control in self care, mobility, lifting, and ambulation.     Therapeutic Activities:    [x]  [phone](97112 or [phone]) Provided verbal/tactile cueing for activities related to improving balance, coordination, kinesthetic sense, posture, motor skill, proprioception and motor activation to allow for proper function  with self care and ADLs  []  [phone](97116) Provided training and instruction to the patient for proper core and proximal hip recruitment and positioning with ambulation re-education     Home Exercise Program:    [x]  (64403(97110) Reviewed/Progressed HEP activities related to strengthening, flexibility, endurance, ROM of core, proximal hip and LE for functional self-care, mobility, lifting and ambulation   []  (47425(97112) Reviewed/Progressed HEP activities related to improving balance, coordination, kinesthetic sense, posture, motor skill, proprioception of core, proximal hip and LE for self care, mobility, lifting, and ambulation      Manual Treatments:  PROM / STM / Oscillations-Mobs:  G-I, II, III, IV (PA's, Inf., Post.)  [x]  (97140) Provided manual therapy to mobilize proximal hip and LS spine soft tissue/joints for the  purpose of modulating pain, promoting relaxation,  increasing ROM, reducing/eliminating soft tissue swelling/inflammation/restriction, improving soft tissue extensibility and allowing for proper ROM for normal function with self care, mobility, lifting and ambulation.     Modalities:       Charges:  Timed Code Treatment Minutes: 45   Total Treatment Minutes: 46     []  EVAL (LOW) 97161 (typically 20 minutes face-to-face)  []  EVAL (MOD) [id number] (typically 30 minutes face-to-face)  []  EVAL (HIGH) 97163 (typically 45 minutes face-to-face)  []  RE-EVAL     [x]  VF([id number](97110) x  2   []  IONTO  []  NMR (97112) x      []  VASO  [x]  Manual (97140) x  1    []  Other:  []  TA x       []  Mech Traction (95188(97012)  []  ES(attended) (41660(97032)      []  ES (un) (63016(97014):     GOALS:  Patient stated goal: improve walking/standing    Therapist goals for Patient:   Short Term Goals: To be achieved in: 2 weeks  1. Independent in HEP and progression per patient tolerance, in order to prevent re-injury.   2. Patient will have a decrease in pain to facilitate improvement in movement, function, and ADLs as indicated by Functional Deficits.    Long Term Goals: To be achieved in: 6 weeks  1. Disability index score of 25% or less for the ODI to assist with reaching prior level of function.   2. Patient will demonstrate increased AROM to WNL, good LS mobility, good hip ROM to allow for proper joint functioning as indicated by patients Functional Deficits.   3. Patient will demonstrate an increase in Strength to good proximal hip and core activation to allow for proper functional mobility as indicated by patients Functional Deficits.   4. Patient will return to standing/walking for at least 20 minutes without increased symptoms or restriction.     Progression Towards Functional goals:  [x]  Patient is progressing as expected towards functional goals listed.    []  Progression is slowed due to complexities listed.  []  Progression has been slowed due to  co-morbidities.  []  Plan just implemented, too soon to assess goals progression  []  Other:     ASSESSMENT:  Mild limitation in both hips  with R more than L- did well with mobilization. Did well with all strengthening exercises today. Patient reporting no pain in legs at conclusion; overall improving posture and less forward flexion. Added upper body exercises to program. Fatigued with wall squats.     Treatment/Activity Tolerance:  [x]  Patient tolerated treatment well []  Patient limited by fatique  []  Patient limited by pain  []  Patient limited by other medical complications  []  Other:     Prognosis: [x]  Good []  Fair  []  Poor    Patient Requires Follow-up: [x]  Yes  []  No    PLAN: See eval  [x]  Continue per plan of care []  Alter current plan (see comments)  []  Plan of care initiated []  Hold pending MD visit []  Discharge    Electronically signed by: Lora PaulaStacy White

## 2017-11-13 ENCOUNTER — Encounter: Payer: MEDICARE | Primary: Internal Medicine

## 2017-11-18 ENCOUNTER — Inpatient Hospital Stay: Admit: 2017-11-18 | Payer: MEDICARE | Primary: Internal Medicine

## 2017-11-18 NOTE — Other (Signed)
[facility], [place]  [address] [place], [place] [address]  Phone: [phone] Fax: [phone]    Physical Therapy Daily Treatment Note  Date:  11/18/2017    Patient Name:  Jordan Gutierrez    DOB:  10-01-1929  MRN: [id number]  Restrictions/Precautions:    Medical/Treatment Diagnosis Information:   Diagnosis: chronic pain syndrome, lumbar stenosis with neurogenic claudication, DDD, chronic neck pain   Treatment Diagnosis: chronic pain syndrome C89.4, lumbar stenosis with neurogenic claudication M48.062, DDD M51.36; chronic neck pain M54.2, lumbar pain M54.5  Insurance/Certification information:  PT Insurance Information: Medicare/Tricare  Physician Information:  Referring Practitioner: Wonda CeriseMegan Marie Stanfield, PA-C  Plan of care signed (Y/N):     Date of Patient follow up with Physician:     G-Code (if applicable):      Date G-Code Applied:  10/15/2017       Progress Note: [x]   Yes  []   No  Next due by: Visit #10      Latex Allergy:  [x] NO      [] YES  Preferred Language for Healthcare:   [x] English       [] other:    Visit # Insurance Allowable   7 Medicare/Tricare     Pain level:  4/10     SUBJECTIVE:  Patient reports that he went to thrift store with his son who was visiting and walked around for 3 hours. Feels like he is having a bit of a set back as he is having more pain in back and legs.  No soreness after last session.     OBJECTIVE: See eval  Observation:   Test measurements:      RESTRICTIONS/PRECAUTIONS: none    Exercises/Interventions:     Therapeutic Ex: 35 min sets/sec reps notes   Bike 1 5    Bridge 2-1 1 20      Alt Arm-Leg   Over table   SLR 2 10    Hooklying hip abd 2 10 blue   Side planks       Kneeling hip abd/ext      1/2 kneeling down chop      Std band pull down np  red   SL hip abd/clam      Lateral band pull      Lateral band walk      Bosu Lunge      SLS 10sec 10 bilat   STS (elevated table ht) 2 12 Ball in hands   Backward walking at table  1 10    POT hip extension 1 20 bilat   Side stepping 1 10    Squats at wall np     LTR 1 15    Manual Intervention :15 min      DN      Prone PA      GISTM/STM      Lumbar Manip      SI Manip      Hip belt mobs 1 10 R only   Hip LA distraction      PROM hip 1 5    NMR re-education       Mf Activation- re-ed   Prone    TrA Re-ed activation   supine    Glute Max re-ed activation   Prone    Prone froggers      [place]        Therapeutic Exercise and NMR EXR  [x]  [phone](97110) Provided verbal/tactile cueing for activities related to  strengthening, flexibility, endurance, ROM  for improvements in proximal hip and core control with self care, mobility, lifting and ambulation.  [x]  [phone]) Provided verbal/tactile cueing for activities related to improving balance, coordination, kinesthetic sense, posture, motor skill, proprioception  to assist with core control in self care, mobility, lifting, and ambulation.     Therapeutic Activities:    [x]  [phone] or 09811) Provided verbal/tactile cueing for activities related to improving balance, coordination, kinesthetic sense, posture, motor skill, proprioception and motor activation to allow for proper function  with self care and ADLs  []  [phone]) Provided training and instruction to the patient for proper core and proximal hip recruitment and positioning with ambulation re-education     Home Exercise Program:    [x]  (29562) Reviewed/Progressed HEP activities related to strengthening, flexibility, endurance, ROM of core, proximal hip and LE for functional self-care, mobility, lifting and ambulation   []  (13086) Reviewed/Progressed HEP activities related to improving balance, coordination, kinesthetic sense, posture, motor skill, proprioception of core, proximal hip and LE for self care, mobility, lifting, and ambulation      Manual Treatments:  PROM / STM / Oscillations-Mobs:  G-I, II, III, IV (PA's, Inf., Post.)  [x]  (97140) Provided manual therapy to mobilize proximal hip and LS spine  soft tissue/joints for the purpose of modulating pain, promoting relaxation,  increasing ROM, reducing/eliminating soft tissue swelling/inflammation/restriction, improving soft tissue extensibility and allowing for proper ROM for normal function with self care, mobility, lifting and ambulation.     Modalities:       Charges:  Timed Code Treatment Minutes: 50   Total Treatment Minutes: 51     []  EVAL (LOW) 97161 (typically 20 minutes face-to-face)  []  EVAL (MOD) 57846 (typically 30 minutes face-to-face)  []  EVAL (HIGH) 97163 (typically 45 minutes face-to-face)  []  RE-EVAL     [x]  NG(29528) x  2   []  IONTO  []  NMR (97112) x      []  VASO  [x]  Manual (97140) x  1    []  Other:  []  TA x       []  Mech Traction (41324)  []  ES(attended) (40102)      []  ES (un) (72536):     GOALS:  Patient stated goal: improve walking/standing    Therapist goals for Patient:   Short Term Goals: To be achieved in: 2 weeks  1. Independent in HEP and progression per patient tolerance, in order to prevent re-injury.   2. Patient will have a decrease in pain to facilitate improvement in movement, function, and ADLs as indicated by Functional Deficits.    Long Term Goals: To be achieved in: 6 weeks  1. Disability index score of 25% or less for the ODI to assist with reaching prior level of function.   2. Patient will demonstrate increased AROM to WNL, good LS mobility, good hip ROM to allow for proper joint functioning as indicated by patients Functional Deficits.   3. Patient will demonstrate an increase in Strength to good proximal hip and core activation to allow for proper functional mobility as indicated by patients Functional Deficits.   4. Patient will return to standing/walking for at least 20 minutes without increased symptoms or restriction.     Progression Towards Functional goals:  [x]  Patient is progressing as expected towards functional goals listed.    []  Progression is slowed due to complexities listed.  []  Progression has been  slowed due to co-morbidities.  []  Plan just implemented, too soon to assess goals progression  []   Other:     ASSESSMENT:  Mild limitation in R hip today; improved with hip mobs. Patient tolerated all exercises. Did well with all strengthening.  Patient reporting no pain in legs or back at conclusion; overall improving posture and less forward flexion. Added upper body exercises to program. Fatigued with wall squats.     Treatment/Activity Tolerance:  [x]  Patient tolerated treatment well []  Patient limited by fatique  []  Patient limited by pain  []  Patient limited by other medical complications  []  Other:     Prognosis: [x]  Good []  Fair  []  Poor    Patient Requires Follow-up: [x]  Yes  []  No    PLAN: See eval  [x]  Continue per plan of care []  Alter current plan (see comments)  []  Plan of care initiated []  Hold pending MD visit []  Discharge    Electronically signed by: Lora PaulaStacy White

## 2017-11-25 ENCOUNTER — Inpatient Hospital Stay: Admit: 2017-11-25 | Payer: MEDICARE | Primary: Internal Medicine

## 2017-11-25 NOTE — Other (Signed)
[facility] - [facility], [place]  [address] [place], [place] [address]  Phone: [phone] Fax: [phone]    Physical Therapy Daily Treatment Note  Date:  11/25/2017    Patient Name:  Jordan Gutierrez    DOB:  October 05, 1929  MRN: [id number]  Restrictions/Precautions:    Medical/Treatment Diagnosis Information:   Diagnosis: chronic pain syndrome, lumbar stenosis with neurogenic claudication, DDD, chronic neck pain   Treatment Diagnosis: chronic pain syndrome C89.4, lumbar stenosis with neurogenic claudication M48.062, DDD M51.36; chronic neck pain M54.2, lumbar pain M54.5  Insurance/Certification information:  PT Insurance Information: Medicare/Tricare  Physician Information:  Referring Practitioner: Wonda CeriseMegan Marie Stanfield, PA-C  Plan of care signed (Y/N):     Date of Patient follow up with Physician:     G-Code (if applicable):      Date G-Code Applied:  10/15/2017       Progress Note: [x]   Yes  []   No  Next due by: Visit #10      Latex Allergy:  [x] NO      [] YES  Preferred Language for Healthcare:   [x] English       [] other:    Visit # Insurance Allowable   8 Medicare/Tricare     Pain level:  2/10     SUBJECTIVE:  Patient reports that he has not been having any other real discomfort into back or legs since last session. Having some soreness in front of thighs.   OBJECTIVE: See eval  Observation:   Test measurements:      RESTRICTIONS/PRECAUTIONS: none    Exercises/Interventions:     Therapeutic Ex: 35 min sets/sec reps notes   Bike 1 5    Bridge 2-1 1 20     Alt Arm-Leg 2 10 Over table   SLR 2 10    Hooklying hip abd 2 10 blue   Side planks       Kneeling hip abd/ext      1/2 kneeling down chop      Std band pull down 2 10 red   SL hip abd/clam      Lateral band pull      Lateral band walk 1 10 orange   Bosu Lunge      SLS 10sec 10 bilat   STS (elevated table ht) 2 12 Ball in hands   Backward walking at table 1 10 orange   POT hip extension   bilat         Squats at wall np      LTR 1 15    Manual Intervention :15 min      DN      Prone PA      GISTM/STM      Lumbar Manip      SI Manip      Hip belt mobs 1 10 bilat   Hip LA distraction      PROM hip 1 5    NMR re-education       Mf Activation- re-ed   Prone    TrA Re-ed activation   supine    Glute Max re-ed activation   Prone    Prone froggers      [place]        Therapeutic Exercise and NMR EXR  [x]  [phone]) Provided verbal/tactile cueing for activities related to strengthening, flexibility, endurance, ROM  for improvements in proximal hip and core control with self care, mobility, lifting and ambulation.  [x]  [phone](97112) Provided verbal/tactile cueing  for activities related to improving balance, coordination, kinesthetic sense, posture, motor skill, proprioception  to assist with core control in self care, mobility, lifting, and ambulation.     Therapeutic Activities:    [x]  [phone](97112 or [phone]) Provided verbal/tactile cueing for activities related to improving balance, coordination, kinesthetic sense, posture, motor skill, proprioception and motor activation to allow for proper function  with self care and ADLs  []  [phone]) Provided training and instruction to the patient for proper core and proximal hip recruitment and positioning with ambulation re-education     Home Exercise Program:    [x]  (13244(97110) Reviewed/Progressed HEP activities related to strengthening, flexibility, endurance, ROM of core, proximal hip and LE for functional self-care, mobility, lifting and ambulation   []  (01027(97112) Reviewed/Progressed HEP activities related to improving balance, coordination, kinesthetic sense, posture, motor skill, proprioception of core, proximal hip and LE for self care, mobility, lifting, and ambulation      Manual Treatments:  PROM / STM / Oscillations-Mobs:  G-I, II, III, IV (PA's, Inf., Post.)  [x]  (97140) Provided manual therapy to mobilize proximal hip and LS spine soft tissue/joints for the purpose of modulating pain, promoting relaxation,   increasing ROM, reducing/eliminating soft tissue swelling/inflammation/restriction, improving soft tissue extensibility and allowing for proper ROM for normal function with self care, mobility, lifting and ambulation.     Modalities:       Charges:  Timed Code Treatment Minutes: 50   Total Treatment Minutes: 51     []  EVAL (LOW) 97161 (typically 20 minutes face-to-face)  []  EVAL (MOD) [id number] (typically 30 minutes face-to-face)  []  EVAL (HIGH) 97163 (typically 45 minutes face-to-face)  []  RE-EVAL     [x]  YQ([id number](97110) x  2   []  IONTO  []  NMR (97112) x      []  VASO  [x]  Manual (97140) x  1    []  Other:  []  TA x       []  Mech Traction (25956(97012)  []  ES(attended) (38756(97032)      []  ES (un) (43329(97014):     GOALS:  Patient stated goal: improve walking/standing    Therapist goals for Patient:   Short Term Goals: To be achieved in: 2 weeks  1. Independent in HEP and progression per patient tolerance, in order to prevent re-injury.   2. Patient will have a decrease in pain to facilitate improvement in movement, function, and ADLs as indicated by Functional Deficits.    Long Term Goals: To be achieved in: 6 weeks  1. Disability index score of 25% or less for the ODI to assist with reaching prior level of function.   2. Patient will demonstrate increased AROM to WNL, good LS mobility, good hip ROM to allow for proper joint functioning as indicated by patients Functional Deficits.   3. Patient will demonstrate an increase in Strength to good proximal hip and core activation to allow for proper functional mobility as indicated by patients Functional Deficits.   4. Patient will return to standing/walking for at least 20 minutes without increased symptoms or restriction.     Progression Towards Functional goals:  [x]  Patient is progressing as expected towards functional goals listed.    []  Progression is slowed due to complexities listed.  []  Progression has been slowed due to co-morbidities.  []  Plan just implemented, too soon to assess goals  progression  []  Other:     ASSESSMENT:  Mild limitation in B hip today; improved with hip mobs. Needing less mobilization to achieve improved ROM.  Patient tolerated all exercises. Did well with all strengthening.  Patient reporting no pain in legs or back at conclusion; overall improving posture and less forward flexion. Added upper body exercises to program.     Treatment/Activity Tolerance:  [x]  Patient tolerated treatment well []  Patient limited by fatique  []  Patient limited by pain  []  Patient limited by other medical complications  []  Other:     Prognosis: [x]  Good []  Fair  []  Poor    Patient Requires Follow-up: [x]  Yes  []  No    PLAN: See eval  [x]  Continue per plan of care []  Alter current plan (see comments)  []  Plan of care initiated []  Hold pending MD visit []  Discharge    Electronically signed by: Lora Paula

## 2017-12-02 ENCOUNTER — Encounter: Payer: MEDICARE | Primary: Internal Medicine

## 2017-12-09 ENCOUNTER — Encounter: Payer: MEDICARE | Primary: Internal Medicine

## 2017-12-11 ENCOUNTER — Ambulatory Visit
Admit: 2017-12-11 | Discharge: 2017-12-11 | Payer: MEDICARE | Attending: Physical Medicine & Rehabilitation | Primary: Internal Medicine

## 2017-12-11 DIAGNOSIS — M48062 Spinal stenosis, lumbar region with neurogenic claudication: Secondary | ICD-10-CM

## 2017-12-11 MED ORDER — HYDROCODONE-ACETAMINOPHEN 7.5-325 MG PO TABS
7.5-325 MG | ORAL_TABLET | Freq: Three times a day (TID) | ORAL | 0 refills | Status: AC | PRN
Start: 2017-12-11 — End: 2018-03-09

## 2017-12-11 MED ORDER — FENTANYL 25 MCG/HR TD PT72
25 MCG/HR | MEDICATED_PATCH | TRANSDERMAL | 0 refills | Status: AC | PRN
Start: 2017-12-11 — End: 2018-03-09

## 2017-12-11 MED ORDER — HYDROCODONE-ACETAMINOPHEN 7.5-325 MG PO TABS
7.5-325 MG | ORAL_TABLET | Freq: Three times a day (TID) | ORAL | 0 refills | Status: AC | PRN
Start: 2017-12-11 — End: 2018-01-10

## 2017-12-11 MED ORDER — FENTANYL 25 MCG/HR TD PT72
25 MCG/HR | MEDICATED_PATCH | TRANSDERMAL | 0 refills | Status: AC | PRN
Start: 2017-12-11 — End: 2018-02-08

## 2017-12-11 MED ORDER — FENTANYL 25 MCG/HR TD PT72
25 MCG/HR | MEDICATED_PATCH | TRANSDERMAL | 0 refills | Status: DC | PRN
Start: 2017-12-11 — End: 2017-12-11

## 2017-12-11 MED ORDER — FENTANYL 25 MCG/HR TD PT72
25 MCG/HR | MEDICATED_PATCH | TRANSDERMAL | 0 refills | Status: AC | PRN
Start: 2017-12-11 — End: 2018-01-10

## 2017-12-11 MED ORDER — HYDROCODONE-ACETAMINOPHEN 7.5-325 MG PO TABS
7.5-325 MG | ORAL_TABLET | Freq: Three times a day (TID) | ORAL | 0 refills | Status: AC | PRN
Start: 2017-12-11 — End: 2018-02-08

## 2017-12-11 NOTE — Progress Notes (Addendum)
 Follow up: SPINE    CHIEF COMPLAINT:    Chief Complaint   Patient presents with   . Back Pain       HISTORY OF PRESENT ILLNESS:                The patient is a 83 y.o. male s/p MLL L2-3 with Dr. Jefm PettyFeree from 11-2016, here to follow up medication maintenance for history of chronic aching neck/trap pain in addition to low back and radiating bilateral posterior leg pain (L>R).  His low back symptoms have been more severe over the last 8 months--he does note some improvement with PT. Pain is worsened w/walking or standing. Some relief with sitting or resting. Prior L ESIs w/out much improvement.    He still reports good improvement with (now weaned) Duragesic 25g & Norco 7.5/325 TID PRN per pt no side effects. This current dose of medication does allow him some better function but still limited with prolonged walking/standing. He is able to perform ADLs independently. Reports improvement of leg weakness following PT. He is able to sleep 6hrs/night but up intermittently to use the commode.  He dnies any progressive numbness tingling. Prior sx re-evaluation with Dr. Sande BrothersFerree rec ESIs prior to further surgery.     Pain Assessment  Location of Pain: Back  Quality of Pain: Sharp, Dull  Duration of Pain: Persistent  Frequency of Pain: Constant  Aggravating Factors: Stairs, Walking, Standing, Squatting, Kneeling, Exercise, Straightening, Stretching, Bending  Limiting Behavior: Yes  Relieving Factors: Rest  Result of Injury: No  Work-Related Injury: No  Are there other pain locations you wish to document?: No    Past/Current Treatment:   PT: Yes  Meds: WEANED to Duragesic 25 g q.72 hours, hydrocodone 7.5 mg TID PRN    Prior: Oral steroids, Gabapentin   Injection:   01/03/15: Lt C7-T1 CESI  12/04/15: Lt C7-T1 CESI   08/16/16: Lt C7-T1 CESI--60%  -B L3 TX ESI--Dr. Arlington CalixSingla  09/10/16 Bil L4-5 TX ESI--50%  02/17/17 Lt C7/T1 CESI   07/15/17 Bilateral L4 5 TX ESI #3 --no significant improvement   Sx: Shoulder, Dr. Paulina FusiHess; s/p MLL Dr.  Sande BrothersFerree December 2017 with recent consult recommending ESI prior to further surgery    Function-Does the pain medication improve your ability to do:    Personal care: Yes   Housework: Yes    Physical activity: Yes, short community distances    Social activity: Yes    Pain Scale: 1-10  With Meds:  3-4/10  Pain Scale: 1-10 Without Meds: 9/10    Potential aberrant drug-related behavior:   Aberrant behavior identified? NO   Potential aberrant behavior identified? NO   Reports loss are stolen prescriptions? NO   Insist on certain medications by name? NO   Purposeful oversedation? NO   Increased dose without authorization? NO   MED: weaned to 82.5   Last UDS: 03/27/17 + MOR c/w hydrocodone--per note, not scanned   ORT/pain contract: 03-27-17  ORT=0    Side Effects: Denies    Past Medical History: Medical history form was reviewd today & scanned into the Media tab  Past Medical History:   Diagnosis Date   . Acid reflux    . CAD (coronary artery disease)    . Cancer [facility](HCC)     PROSTATE CANCER   . Chronic pain     neck, shoulder, and back   . Diabetes mellitus (HCC)    . HOH (hard of hearing)     hearing imparied   .  Hypertension    . Pancreatitis 10/15/11        REVIEW OF SYSTEMS:   CONSTITUTIONAL: Denies unexplained weight loss, fevers, chills   NEUROLOGIC: Denies tremors or seizures         PHYSICAL EXAM:    Vitals: Blood pressure (!) 120/59, pulse 111, height 5' 11.58" (1.818 m), weight 190 lb 0.6 oz (86.2 kg).    GENERAL EXAM:   General Apparence: Patient is adequately groomed with no evidence of malnutrition.   Orientation: The patient is oriented to time, place and person.    Mood & Affect:The patient's mood and affect are appropriate   Lymphatic: The lymphatic examination bilaterally reveals all areas to be without enlargement or induration   Sensation: Sensation is intact without deficit  LUMBAR/SACRAL EXAMINATION:   Inspection: Local inspection shows no step-off or bruising. Mild kyphosis         Palpation:   No evidence of tenderness at the midline.     Range of Motion: Full flexion today, 5-10 degrees extension     Strength:   Strength testing is 5/5 in all muscle groups tested.    Special Tests: Straight leg raise and crossed SLR negative.  Leg length and pelvis level.  0 out of 5 Waddell's signs.         Skin: There are no rashes, ulcerations or lesions.   Reflexes: Reflexes are symmetrically 1+at the patellar and ankle tendons.  Clonus absent bilaterally at the feet.   Gait & station: forward flexed unassisted       Additional Examinations:    LOWER EXTREMITY: Inspection/examination of the right lower extremity does not show any tenderness, deformity or injury. Range of motion is full. There is no gross instability.  There are no rashes, ulcerations or lesions. Strength and tone are normal.   LEFT LOWER EXTREMITY:  Inspection/examination of the left lower extremity does not show any tenderness, deformity or injury. Range of motion is full. There is no gross instability. There are no rashes, ulcerations or lesions.  Strength and tone are normal.    Diagnostic Testing:   UDS 03/27/17 + MOR c/w hydrocodone  OARRs: he did have a short RX for norco from a dentist following dental work     Updated lumbar MRI 06/23/2017 ordered by Dr. Sande Brothers  Interval postoperative changes of left laminectomy and facetectomy at L2-L3 with enhancing    granulation tissue extending into the operative defect and left ventrolateral epidural space.    There is reduced disc material in the left subarticular zone at this    level.      Multilevel degenerative disc disease and facet arthrosis as detailed, causing moderate central    canal stenosis at L2-L3 and L4-L5 with mild to moderate central canal stenosis at L3-L4.      Multilevel foraminal stenosis, with moderate bilateral L2 and L3 foraminal stenosis, moderate    left and moderate to severe right L4 foraminal stenosis, and moderate left, severe right L5     foraminal stenosis.        His recent lumbar MRI report reviewed showing significant lumbar stenosis L2-3 and L4-5    8/4/17MRI cervical spine:   IMPRESSION]         Broad-based disc bulging/posterior osteophytic ridging with midline disc protrusions at the    C3-4 and C4-5 levels with moderate to severe narrowing of the central canal.      Facet arthropathy at C5-6 and to a lesser degree C6-7 with neural foraminal  narrowings as    described.        UDS June 2016 positive for OxyIR as expected        Impression:  1) Chronic lbp, bilateral lumbar radiculitis, neurogenic claudication  2) Severe lumbar stenosis L2-3, L4 5, s/p MLL w/Dr. Sande BrothersFerree  3) Chronic neck/trap pain w/underlying spondylosis  4) H/o prostate cancer  5) Opioid maintenance, low risk ORT=0       Plan:    1) Duragesic 25mcg q72 #10 x3 scripts, Norco 7.5/325 I po TID PRN #90 x 3 scripts    2) Cont PT    4) Despite higher MED he reports increased functionality with decreased pain for his chronic neck and low back issues without side effects.      5) F/u 39mo     Controlled Substances Monitoring:     RX Monitoring 06/19/2017   Attestation The Prescription Monitoring Report for this patient was reviewed today.   Documentation Possible medication side effects, risk of tolerance/dependence & alternative treatments discussed.;No signs of potential drug abuse or diversion identified.;Obtaining appropriate analgesic effect of treatment.   Chronic Pain Treatment objectives documented - patient is progressing appropriately.;Functional status reviewed - continues with improved or maintaining ADL's.;Reviewed the patient's functional status and documentation.;Dose reduction has been attempted.     The risks and use of maintenance opiate medication were reviewed.  These include the risk of tolerance, addition, and abuse.  Potential side effects were also discussed.   Medications are to be taken as prescribed and not escalated without prior  agreement.  OARRS/KASPER reviewed & appropriate.   Opiate medications will only be prescribed through the office of Dr Carmon GinsbergF. Donato Schultzlifford Valentin, M. D. unless notified otherwise           Dictated by Marene LenzMegan Donnita Farina, PA-C, also seen & evaluated by Dr. Donato Schultzlifford  Valentin

## 2017-12-17 ENCOUNTER — Encounter: Payer: MEDICARE | Primary: Internal Medicine

## 2018-01-05 NOTE — Telephone Encounter (Signed)
L/M FOR PATIENTS SON TO CALL ME ABOUT THE ESI REQUESTED

## 2018-01-05 NOTE — Telephone Encounter (Signed)
AFTER SPEAKING WITH DR. Eduardo OsierVALENTIN I CALLED AND SPOKE WITH PATIENT AND HIS SON.   SINCE THE PAIN IS ONLY IN THE LOW BACK I LET THEM KNOW THAT DR. VALENTIN DOES NOT FEEL FURTHER INJECTIONS WILL HELP.    HE SUGGESTS PT, BRACE, OR COMPOUNDING MEDICATION(CREAM).    HIS SON STATES THEY WILL DISCUSS THINGS AND LET US KNOW WHAT THEY  WISH TO DO

## 2018-01-20 ENCOUNTER — Encounter

## 2018-01-20 NOTE — Telephone Encounter (Signed)
SINCE PATIENT IS OUT OF TOWN FOR 2 MONTHS SCRIPT FOR PT FAXED TO [facility] ATTN JULIE AT [phone] PER SONS REQUEST

## 2018-01-28 NOTE — Telephone Encounter (Signed)
FAXED [facility] MEDICAL RECORDS FOR FCV FOR THE LAST 18 MONTHS TO [facility] @ [phone]

## 2018-03-05 ENCOUNTER — Encounter: Attending: Physician Assistant | Primary: Internal Medicine

## 2018-04-02 ENCOUNTER — Ambulatory Visit
Admit: 2018-04-02 | Discharge: 2018-04-02 | Payer: MEDICARE | Attending: Physical Medicine & Rehabilitation | Primary: Internal Medicine

## 2018-04-02 DIAGNOSIS — M48062 Spinal stenosis, lumbar region with neurogenic claudication: Secondary | ICD-10-CM

## 2018-04-02 MED ORDER — FENTANYL 25 MCG/HR TD PT72
25 MCG/HR | MEDICATED_PATCH | TRANSDERMAL | 0 refills | Status: AC
Start: 2018-04-02 — End: 2018-07-03

## 2018-04-02 MED ORDER — HYDROCODONE-ACETAMINOPHEN 7.5-325 MG PO TABS
7.5-325 MG | ORAL_TABLET | Freq: Three times a day (TID) | ORAL | 0 refills | Status: AC
Start: 2018-04-02 — End: 2018-06-02

## 2018-04-02 MED ORDER — HYDROCODONE-ACETAMINOPHEN 7.5-325 MG PO TABS
7.5-325 MG | ORAL_TABLET | Freq: Three times a day (TID) | ORAL | 0 refills | Status: AC
Start: 2018-04-02 — End: 2018-07-02

## 2018-04-02 MED ORDER — HYDROCODONE-ACETAMINOPHEN 7.5-325 MG PO TABS
7.5-325 MG | ORAL_TABLET | Freq: Three times a day (TID) | ORAL | 0 refills | Status: AC
Start: 2018-04-02 — End: 2018-05-05

## 2018-04-02 MED ORDER — FENTANYL 25 MCG/HR TD PT72
25 MCG/HR | MEDICATED_PATCH | TRANSDERMAL | 0 refills | Status: AC
Start: 2018-04-02 — End: 2018-05-07

## 2018-04-02 MED ORDER — FENTANYL 25 MCG/HR TD PT72
25 MCG/HR | MEDICATED_PATCH | TRANSDERMAL | 0 refills | Status: AC
Start: 2018-04-02 — End: 2018-06-04

## 2018-04-02 NOTE — Progress Notes (Signed)
 Follow up: SPINE    CHIEF COMPLAINT:    Chief Complaint   Patient presents with   ??? Back Pain       HISTORY OF PRESENT ILLNESS:                The patient is a 84 y.o. male s/p MLL L2-3 with Dr. Jefm PettyFeree from 11-2016,, chronic neck pain follow-up for medication maintenance.  He is here with his daughter-in-law.  His son who is normally with him unfortunately passed away recent traffic accident.  Jordan Gutierrez [place] been with [place] in [place] the last 2 months and is opioid prescriptions were filled there last 2 months.  Symptoms are recently worse    Reports neck pain radiating to his left shoulder with decreased range of motion.  He has axial back pain and some referred pain in his left leg.  He reports no progressive weakness.  He sleeping up to 6 hours per night    Pain Assessment  Location of Pain: Back  Severity of Pain: 4  Quality of Pain: Sharp, Dull, Aching  Duration of Pain: Persistent  Frequency of Pain: Constant  Aggravating Factors: Walking, Stairs, Standing, Squatting, Kneeling, Exercise, Straightening, Stretching, Bending  Limiting Behavior: Yes  Relieving Factors: Rest  Result of Injury: No  Work-Related Injury: No  Are there other pain locations you wish to document?: No    Past/Current Treatment:   PT: Yes  Meds: WEANED to Duragesic 25 ??g q.72 hours, hydrocodone 7.5 mg TID PRN    Prior: Oral steroids, Gabapentin   Injection:   01/03/15: Lt C7-T1 CESI  12/04/15: Lt C7-T1 CESI   08/16/16: Lt C7-T1 CESI--60%  -B L3 TX ESI--Dr. [place] CalixSingla  09/10/16 Bil L4-5 TX ESI--50%  02/17/17 Lt C7/T1 CESI   07/15/17 Bilateral L4 5 TX ESI #3 --no significant improvement   Sx: Shoulder, Dr. Paulina FusiHess; s/p MLL Dr. Sande BrothersFerree December 2017 with recent consult recommending ESI prior to further surgery    Function-Does the pain medication improve your ability to do:   ?? Personal care: Yes  ?? Housework: Yes   ?? Physical activity: Yes, short community distances   ?? Social activity: Yes    Pain Scale: 1-10  With Meds:4/10  Pain  Scale: 1-10 Without Meds: 9/10    Potential aberrant drug-related behavior:  ?? Aberrant behavior identified? NO  ?? Potential aberrant behavior identified? NO  ?? Reports loss are stolen prescriptions? NO  ?? Insist on certain medications by name? NO  ?? Purposeful oversedation? NO  ?? Increased dose without authorization? NO  ?? MED: weaned to 82.5  ?? Last UDS: 03/27/17 + MOR c/w hydrocodone--per note, not scanned  ?? ORT/pain contract: 04-02-18  ORT=0    Side Effects: Denies    Past Medical History: Medical history form was reviewd today & scanned into the Media tab  Past Medical History:   Diagnosis Date   ??? Acid reflux    ??? CAD (coronary artery disease)    ??? Cancer (HCC)     PROSTATE CANCER   ??? Chronic pain     neck, shoulder, and back   ??? Diabetes mellitus (HCC)    ??? HOH (hard of hearing)     hearing imparied   ??? Hypertension    ??? Pancreatitis 10/15/11        REVIEW OF SYSTEMS:   CONSTITUTIONAL: Denies unexplained weight loss, fevers, chills   NEUROLOGIC: Denies tremors or seizures  PHYSICAL EXAM:    Vitals: Blood pressure (!) 144/79, pulse 94, height 5' 11.58" (1.818 m), weight 190 lb 0.6 oz (86.2 kg).    GENERAL EXAM:  ?? General Apparence: Patient is adequately groomed with no evidence of malnutrition.  ?? Orientation: The patient is oriented to time, place and person.   ?? Mood & Affect:The patient's mood and affect are appropriate  ?? Lymphatic: The lymphatic examination bilaterally reveals all areas to be without enlargement or induration  ?? Sensation: Sensation is intact without deficit  LUMBAR/SACRAL EXAMINATION:  ?? Inspection: Local inspection shows no step-off or bruising. Mild kyphosis       ?? Palpation:   No evidence of tenderness at the midline.    ?? Range of Motion: Full flexion today, 5-10 degrees extension    ?? Strength:   Strength testing is 5/5 in all muscle groups tested.   ?? Special Tests: Straight leg raise and crossed SLR negative.  Leg length and pelvis level.  0 out of 5 Waddell's signs.         ?? Skin: There are no rashes, ulcerations or lesions.  ?? Reflexes: Reflexes are symmetrically 1+at the patellar and ankle tendons.  Clonus absent bilaterally at the feet.  ?? Gait & station: forward flexed unassisted      ?? Additional Examinations: His neck exam shows 50% loss of rotation left and right, 60-70% loss of extension, 50% loss of flexion, 5 out of 5 upper show me strength without hyperreflexia  ?? LOWER EXTREMITY: Inspection/examination of the right lower extremity does not show any tenderness, deformity or injury. Range of motion is full. There is no gross instability.  There are no rashes, ulcerations or lesions. Strength and tone are normal.  ?? LEFT LOWER EXTREMITY:  Inspection/examination of the left lower extremity does not show any tenderness, deformity or injury. Range of motion is full. There is no gross instability. There are no rashes, ulcerations or lesions.  Strength and tone are normal.    Diagnostic Testing:   UDS 03/27/17 + MOR c/w hydrocodone  OARRs: he did have a short RX for norco from a dentist following dental work     Updated lumbar MRI 06/23/2017 ordered by Dr. Sande Brothers  Interval postoperative changes of left laminectomy and facetectomy at L2-L3 with enhancing    granulation tissue extending into the operative defect and left ventrolateral epidural space.    There is reduced disc material in the left subarticular zone at this    level.   ??   Multilevel degenerative disc disease and facet arthrosis as detailed, causing moderate central    canal stenosis at L2-L3 and L4-L5 with mild to moderate central canal stenosis at L3-L4.   ??   Multilevel foraminal stenosis, with moderate bilateral L2 and L3 foraminal stenosis, moderate    left and moderate to severe right L4 foraminal stenosis, and moderate left, severe right L5    foraminal stenosis.   ??     His recent lumbar MRI report reviewed showing significant lumbar stenosis L2-3 and L4-5  ??  08/02/16??MRI cervical spine: ??  IMPRESSION]   ????   ????    Broad-based disc bulging/posterior osteophytic ridging with midline disc protrusions at the    C3-4 and C4-5 levels with moderate to severe narrowing of the central canal.   ????   Facet arthropathy at C5-6 and to a lesser degree C6-7 with neural foraminal narrowings as    described.   ????   ??  ??  UDS June 2016 positive for OxyIR as expected      ??  Impression:  1) Chronic lbp, bilateral lumbar radiculitis, neurogenic claudication  2) Severe lumbar stenosis L2-3, L4 5, s/p MLL w/Dr. Sande Brothers  3) Chronic neck/trap pain w/underlying spondylosis  4) H/o prostate cancer  5) Opioid maintenance, low risk ORT=0       Plan:  Clinical course is worse.      1) Duragesic q72 #10 x3 scripts, Norco 7.5/325 I po TID PRN #90 x 3 scripts    2) left C7-T1 intralaminar cervical epidural, 1 new series    4) Despite higher MED he reports increased functionality with decreased pain for his chronic neck and low back issues without side effects.      5) Con Arganbright/u 103mo       Objective Goals: Physically and mentally function, carry on ADLs and achieve quality of life     Rationale for medication choice and dosage: To improve physical functionality, perform ADLS and achieve quality of life.    The risks and use of maintenance opiate medication were reviewed.  These include the risk of tolerance, addition, and abuse.  Potential side effects were also discussed. Informed verbal consent was obtained.   Medications are to be taken as prescribed and not escalated without prior agreement.  OARRS/KASPER reviewed & appropriate.   Opiate medications will only be prescribed through the office of Dr Carmon Ginsberg. Donato Schultz, M. D. unless notified otherwise         Goals of the current treatment regimen include: improvement in pain, improvement in overall in physical performance, ability to perform daily activities, work or disability, emotional distress, health care utilization and decreased medication consumption.     Progress will be monitored towards  achieving/maintaining therapeutic goals:    1. Improving perceived interference of pain with ADL's, ability to perform HEP, continue to improve in overall flexibility, ROM, strength and endurance, ability to do household activities indoor and/or outdoor, work and social/leisure activities. Improve psychosocial and physical functioning.    2. Improving sleep to 6-7 hours a night. Improve mood/ anxiety and depression symptoms    3. Reduction of reliance on opioid analgesia/more appropriate opioid use. Utilization of adjuvant medications as appropriate.     Risks and benefits of the medications and alternative treatments have been discussed with the patient. The patient was advised against drinking alcohol with the opioid pain medicines, advised against driving or handling machinery when starting or adjusting the dose of medicines, feeling groggy or drowsy, or if having any cognitive issues related to the current medications. The patient is fully aware of the risk of overdose and death, if medicines are misused and not taken as prescribed.  Discussed patient's responsibility to safely store and appropriately dispose up medication.  Prescription for Narcan offered. If the patient develops new symptoms or if the symptoms worsen, the patient was told to call the office.         Dr. Donato Schultz

## 2018-04-03 NOTE — Telephone Encounter (Signed)
CPT 1610,96045 NPR DOS 04/20/2018

## 2018-04-20 ENCOUNTER — Inpatient Hospital Stay: Payer: MEDICARE

## 2018-04-20 MED ORDER — DEXAMETHASONE SOD PHOSPHATE PF 10 MG/ML IJ SOLN
10 MG/ML | INTRAMUSCULAR | Status: DC | PRN
Start: 2018-04-20 — End: 2018-04-20
  Administered 2018-04-20: 14:00:00 10

## 2018-04-20 MED ORDER — MIDAZOLAM HCL 2 MG/2ML IJ SOLN
2 | INTRAMUSCULAR | Status: AC
Start: 2018-04-20 — End: 2018-04-20

## 2018-04-20 MED ORDER — LIDOCAINE HCL 1 % (PF) IJ SOLN 2 ML
1 % (PF) | Freq: Once | INTRAMUSCULAR | Status: AC
Start: 2018-04-20 — End: 2018-04-20
  Administered 2018-04-20: 14:00:00 0.1 mL via INTRADERMAL

## 2018-04-20 MED ORDER — MIDAZOLAM HCL 5 MG/ML IJ SOLN
5 MG/ML | INTRAMUSCULAR | Status: DC | PRN
Start: 2018-04-20 — End: 2018-04-20
  Administered 2018-04-20: 14:00:00 1 via INTRAVENOUS

## 2018-04-20 MED ORDER — IOHEXOL 240 MG/ML IJ SOLN
240 | INTRAMUSCULAR | Status: AC
Start: 2018-04-20 — End: 2018-04-20

## 2018-04-20 MED ORDER — DEXAMETHASONE SOD PHOSPHATE PF 10 MG/ML IJ SOLN
10 | INTRAMUSCULAR | Status: AC
Start: 2018-04-20 — End: 2018-04-20

## 2018-04-20 MED ORDER — LIDOCAINE HCL (PF) 1 % IJ SOLN
1 % | INTRAMUSCULAR | Status: DC | PRN
Start: 2018-04-20 — End: 2018-04-20
  Administered 2018-04-20: 14:00:00 3 via INTRADERMAL

## 2018-04-20 MED ORDER — SODIUM CHLORIDE 0.9 % IJ SOLN
0.9 % | INTRAMUSCULAR | Status: DC | PRN
Start: 2018-04-20 — End: 2018-04-20
  Administered 2018-04-20: 14:00:00 2

## 2018-04-20 MED ORDER — LACTATED RINGERS IV SOLN
INTRAVENOUS | Status: DC
Start: 2018-04-20 — End: 2018-04-20
  Administered 2018-04-20: 14:00:00 via INTRAVENOUS

## 2018-04-20 MED ORDER — SODIUM CHLORIDE 0.9 % IJ SOLN
0.9 | INTRAMUSCULAR | Status: AC
Start: 2018-04-20 — End: 2018-04-20

## 2018-04-20 MED ORDER — IOHEXOL 240 MG/ML IJ SOLN
240 MG/ML | INTRAMUSCULAR | Status: DC | PRN
Start: 2018-04-20 — End: 2018-04-20
  Administered 2018-04-20: 14:00:00 1 via EPIDURAL

## 2018-04-20 MED FILL — MIDAZOLAM HCL 2 MG/2ML IJ SOLN: 2 mg/mL | INTRAMUSCULAR | Qty: 2

## 2018-04-20 MED FILL — SODIUM CHLORIDE 0.9 % IJ SOLN: 0.9 % | INTRAMUSCULAR | Qty: 10

## 2018-04-20 MED FILL — LIDOCAINE HCL (PF) 1 % IJ SOLN: 1 % | INTRAMUSCULAR | Qty: 2

## 2018-04-20 MED FILL — LACTATED RINGERS IV SOLN: INTRAVENOUS | Qty: 1000

## 2018-04-20 MED FILL — DEXAMETHASONE SOD PHOSPHATE PF 10 MG/ML IJ SOLN: 10 mg/mL | INTRAMUSCULAR | Qty: 1

## 2018-04-20 MED FILL — OMNIPAQUE 240 MG/ML IJ SOLN: 240 mg/mL | INTRAMUSCULAR | Qty: 50

## 2018-04-20 NOTE — Progress Notes (Signed)
Pt arrived to PACU,A&O,no c/o pain and/or numbness or tingling, vitals stable, site unremarkable

## 2018-04-20 NOTE — Discharge Instructions (Signed)
[facility]          [phone]  POST EPIDURAL STEROID INJECTION    PATIENT INSTRUCTIONS:  1)  DIET   .  RESUME NORMAL DIET   .  RESUME ALL PRIOR MEDICATIONS  2)  ACTIVITY  .   DO NOT STAY ALONE FOR 4-6 HOURS AFTER THE PROCEDURE  .  REST TODAY  .  DO NOT DRIVE TODAY AND 24 HOURS IF YOU RECEIVED SEDATION.  IF YOU ARE   SEEN DRIVING DURING THIS TIME THE PROPER AUTHORITIES WILL BE NOTIFIED.  .  DO NOT SIGN ANY LEGAL DOCUMENTS, MAKE ANY MAJOR DECISIONS, OR BE INVOLVED IN WORK DECISIONS FOR THE REMAINDER OF THE DAY.   .  RESUME NORMAL ACTIVITIES.  DO NOT OVER EXERT YOURSELF.  .  SHOWER OR BATHE AS NORMAL  3)  SITE CARE   .  MAY USE ICE TO SITE IF NEEDED              .  KEEP SITE DRY FOR 12 HOURS.  IF A BAND-AID WAS APPLIED TO THE                 INJECTION  SITE REMOVE BAND-AID THE FOLLOWING DAY  .  OBSERVE PUNCTURE SITE FOR SIGNS OF INFECTION (REDNESS, WARMTH, SWELLING, PURULENT DRAINAGE, INCREASED TENDERNESS OR FEVER GREATER THAN 101)  .  REPORT SIGNS OF INFECTION TO THE PHYSICIAN  4)  EXPECTED SIDE EFFECTS   .  FLUID RETENTION   .  NERVOUS ENERGY   .  SLEEPLESSNESS   .  MUSCLE SPASMS  . TEMPORARY WEAKNESS/TINGLING/NUMBNESS IN THE EXTREMITIES   . PAIN AT INJECTION SITE   .  DROWSINESS  .  POSSIBLE ELEVATION OF BLOOD SUGARS IF YOU ARE DIABETIC  5)  TO REACH DR. VALENTIN  .  CALL IF SYMPTOMS WORSEN SEVERELY OR FOR A SEVERE HEADACHE THAT WORSENS WHEN UPRIGHT     .  CALL IF YOU DEVELOP A FEVER GREATER THAN 101  .  CALL [phone] TO MAKE A FOLLOW UP APPOINTMENT WITH DR. VALENTIN   .  CALL REFERRING DOCTOR IF ANY PROBLEMS   6)  ADDITIONAL INSTRUCTIONS  .  IT MAY TAKE 24 TO 36 HOURS TO FEEL IMPROVEMENT

## 2018-04-20 NOTE — Progress Notes (Signed)
Discharge instructions given to pt/family and verbalized understanding, states pain is at a tolerable level, no numbness or weakness noted, pt wheeled out to car without complications

## 2018-04-20 NOTE — H&P (Addendum)
HISTORY AND PHYSICAL/PRE-SEDATION ASSESSMENT    Patient:  Jordan Gutierrez, Jordan Gutierrez   DOB:   Apr 27, 1929  Medical Record No.:  [id number]   Date:  04/20/2018  Physician:  Pierre Bali, M.D.  Facility: [facility]    Chief Complaint:  neck pain     History of Present Illness:     The patient is a 84 y.o. male who presents with chronic achy neck pain radiating to the left shoulder with limited ROM. Denies any progressive weakness or recent trauma.     Past Surgical History:    Past Surgical History:   Procedure Laterality Date   ??? CARDIAC SURGERY  2012    CABG   ??? CORONARY ANGIOPLASTY WITH STENT PLACEMENT  2012   ??? MANDIBLE FRACTURE SURGERY  10/15/11   ??? OTHER SURGICAL HISTORY  12/06/2016    Bilateral L2-3 hemilaminotomy, partial facetectomy and foraminotomy   ??? PATELLA FRACTURE SURGERY  10/15/11   ??? PR NJX DX/THER SBST INTRLMNR CRV/THRC W/IMG GDN Bilateral 07/15/2017    BILATERAL LUMBAR FOUR FIVE EPIDURAL STEROID INJECTION SITE CONFIRMED BY FLUOROSCOPY performed by Pierre Bali, MD at [facility] [place] [facility]   ??? PROSTATE SURGERY      cancer   ??? ROTATOR CUFF REPAIR Bilateral        Past Medical History:  Past Medical History:   Diagnosis Date   ??? Acid reflux    ??? CAD (coronary artery disease)    ??? Cancer (HCC)     PROSTATE CANCER   ??? Chronic pain     neck, shoulder, and back   ??? Diabetes mellitus (HCC)    ??? HOH (hard of hearing)     hearing imparied   ??? Hypertension    ??? Pancreatitis 10/15/11       Allergies: No Known Allergies    Home Medications:    Prior to Admission medications    Medication Sig Start Date End Date Taking? Authorizing Provider   fentaNYL (DURAGESIC) 25 MCG/HR Place 1 patch onto the skin every 3 days for 30 days. Intended supply: 30 days 04/07/18 05/07/18  Pierre Bali, MD   fentaNYL (DURAGESIC) 25 MCG/HR Place 1 patch onto the skin every 3 days for 30 days. Intended supply: 30 days 05/05/18 06/04/18  Pierre Bali, MD   fentaNYL (DURAGESIC) 25 MCG/HR Place 1 patch onto the  skin every 3 days for 30 days. Intended supply: 30 days 06/03/18 07/03/18  Pierre Bali, MD   HYDROcodone-acetaminophen (NORCO) 7.5-325 MG per tablet Take 1 tablet by mouth three times daily for 30 days. Intended supply: 30 days 04/05/18 05/05/18  Pierre Bali, MD   HYDROcodone-acetaminophen [facility]) 7.5-325 MG per tablet Take 1 tablet by mouth three times daily for 30 days. Intended supply: 30 days 05/03/18 06/02/18  Pierre Bali, MD   HYDROcodone-acetaminophen (NORCO) 7.5-325 MG per tablet Take 1 tablet by mouth 3 times daily for 30 days. Intended supply: 30 days 06/02/18 07/02/18  Pierre Bali, MD   docusate sodium (COLACE) 100 MG capsule Take 1 capsule by mouth 2 times daily as needed for Constipation 12/06/16   Mariane Baumgarten, PA   cyanocobalamin 1000 MCG/ML injection every 14 days  07/29/16   Historical Provider, MD   escitalopram (LEXAPRO) 20 MG tablet 20 mg daily  05/08/16   Historical Provider, MD   NEXIUM 40 MG delayed release capsule every morning (before breakfast)  04/29/16   Historical Provider, MD   lisinopril (PRINIVIL;ZESTRIL)  5 MG tablet Take 5 mg by mouth daily  05/10/16   Historical Provider, MD   TOPROL XL 25 MG extended release tablet Take 25 mg by mouth daily  05/10/16   Historical Provider, MD   tamsulosin (FLOMAX) 0.4 MG capsule Take 0.4 mg by mouth daily  05/28/16   Historical Provider, MD   aspirin 81 MG tablet Take 81 mg by mouth daily    Historical Provider, MD   pregabalin (LYRICA) 50 MG capsule Take 50 mg by mouth 2 times daily.    Historical Provider, MD   finasteride (PROSCAR) 5 MG tablet Take 5 mg by mouth daily.    Historical Provider, MD   METFORMIN HCL PO Take  by mouth. TAKE 2 TABLETS TWICE A DAY     Historical Provider, MD   ZOLPIDEM TARTRATE PO Take  by mouth nightly.      Historical Provider, MD       Vitals:   Vitals:    04/20/18 1051   BP: 116/70   Pulse: 99   Resp: 17   Temp:    SpO2: 97%     PHYSICAL EXAM:  HENT: Airway patent and reviewed  Cardiovascular: Normal rate,  regular rhythm, normal heart sounds.   Pulmonary/Chest: No wheezes. No rhonchi. No rales.   Abdominal: Soft. Bowel sounds are normal. No distension.    MALLAMPATI:           []    I.   Complete visualization of the soft palate           [x]    II.  Complete visualization of the uvula            []    III.  Visualization of only the base of the uvula           []    IV. Soft palate is not visible     ASA CLASS:         []    I. Normal, healthy adult           [x]    II.  Mild systemic disease            []    III.  Severe systemic disease    Impression:  1) Chronic lbp, bilateral lumbar radiculitis, neurogenic claudication  2) Severe lumbar stenosis L2-3, L4 5, s/p MLL w/Dr. Sande BrothersFerree  3) Chronic neck/trap pain w/underlying spondylosis  4) H/o prostate cancer  5) Opioid maintenance, low risk ORT=0   ??  ??  Plan:  ??  1) Left C7-T1 intralaminar cervical epidural, 1 new series  ??  Sedation plan:   [x]   Local              [x]   Minimal                  []   General anesthesia    Patient's condition acceptable for planned procedure/sedation.         Post Procedure Plan              Return to same level of care              ______________________                The risks and benefits as well as alternatives to the procedure have been discussed with the patient and or family.  The patient and or next of kin understands and agrees to proceed.    Pierre BaliF Clifford Jeffery Bachmeier, M.D.

## 2018-04-20 NOTE — Op Note (Signed)
Patient:  Jordan Gutierrez, Jordan Gutierrez   Medical Record #:  [id number]   Date:  04/20/2018  Physician:  Pierre BaliF Clifford Christe Tellez, M.D.  [facility]     Pre-op diagnosis:  Cervical radiculitis, cervical spondylosis  Post-op diagnosis:  same  Procedure: Left C7/T1 cervical interlaminar epidural injection #1 with flouroscopic guidance  Anesthesia: Conscious sedation with 1mg  Versed   Procedure Note:    The patient was admitted through pre-op and written consent was obtained.  The patient was advised of the risks and benefits of the procedure, including but not limited to the following: bleeding, pain, infection, temporary paralysis, nerve damage and spinal headache.  The patient was given the opportunity to ask questions.  There were no contraindications for this procedure.    The appropriate area was prepped and draped in a sterile fashion.  Landmarks were identified and marked.  The skin and soft tissues were anesthetized with 1% lidocaine.  A 22G 3.5inch Touhy needle was advanced to the left C7/T1 interlaminar space using fluoroscopic guidance confirmed by multiple views showing appropriate needle placement.  Attempted C6 7 level soft tissue and epidural flow noted.  Injection of contrast showed epidural flow.  There were no signs of intravascular or intrathecal injection.    10 mg Dexamethasone and 2 mL normal saline solution were then injected.     There were no complications and the patient tolerated the procedure well.  The patient was transferred to the recovery area and monitored.  Discharge instructions were given.  The patient is to contact me for any post-procedure concerns.  The patient is to follow up as scheduled.    LOR: 3cm    Commodore Bellew Donato Schultzlifford Antonia Culbertson, MD

## 2018-04-20 NOTE — Op Note (Signed)
POST SEDATION ASSESSMENT      Patient:  Jordan Gutierrez, Jordan Gutierrez   DOB:  [birth year]  Medical Record No.:  [id number]   Date:  04/20/2018  Physician:  Pierre BaliF Clifford Julisa Flippo, M.D.      Patient location: Recovery  Level of consciousness: Awake, Alert, Oriented  Pain Control: Good  Respiration: Adequate  Post-op assessment: No sedation complications    Last Vitals:   Vitals:    04/20/18 1026   BP: 129/80   Pulse: 97   Resp: 16   Temp:    SpO2: 98%     Post-op Vitals: Stable    Jordan Gutierrez  10:35 AM

## 2018-05-07 ENCOUNTER — Ambulatory Visit
Admit: 2018-05-07 | Discharge: 2018-05-07 | Payer: MEDICARE | Attending: Physical Medicine & Rehabilitation | Primary: Internal Medicine

## 2018-05-07 DIAGNOSIS — M48062 Spinal stenosis, lumbar region with neurogenic claudication: Secondary | ICD-10-CM

## 2018-05-07 NOTE — Progress Notes (Signed)
 Follow up: SPINE    CHIEF COMPLAINT:    Chief Complaint   Patient presents with   ??? Back Pain       HISTORY OF PRESENT ILLNESS:                The patient is a 84 y.o. male s/p MLL L2-3 with Dr. Jefm Petty from 11-2016,,    His neck pain is significantly improved after his recent left cervical epidural injection.  He continues with axial back pain.  He notes therapy has helped significantly previously.  He had a decompression at L2-3 in December 2017.  His MRI also noted central stenosis at L4 5.  His leg pain has been resolved to surgery and physical therapy    Pain Assessment  Location of Pain: Neck  Severity of Pain: 4  Quality of Pain: Sharp, Dull, Aching  Duration of Pain: Persistent  Frequency of Pain: Constant  Aggravating Factors: Stairs, Walking, Standing, Squatting, Kneeling, Exercise, Straightening, Stretching, Bending  Limiting Behavior: Yes  Relieving Factors: Rest  Result of Injury: No  Work-Related Injury: No  Are there other pain locations you wish to document?: No]      Past/Current Treatment:   PT: Yes  Meds: WEANED to Duragesic 25 ??g q.72 hours, hydrocodone 7.5 mg TID PRN    Prior: Oral steroids, Gabapentin   Injection:   01/03/15: Lt C7-T1 CESI  12/04/15: Lt C7-T1 CESI   08/16/16: Lt C7-T1 CESI--60%  -B L3 TX ESI--Dr. Arlington Calix  09/10/16 Bil L4-5 TX ESI--50%  02/17/17 Lt C7/T1 CESI   07/15/17 Bilateral L4 5 TX ESI #3 --no significant improvement   Sx: Shoulder, Dr. Paulina Fusi; s/p MLL Dr. Sande Brothers December 2017 with recent consult recommending ESI prior to further surgery    Function-Does the pain medication improve your ability to do:   ?? Personal care: Yes  ?? Housework: Yes   ?? Physical activity: Yes, short community distances   ?? Social activity: Yes    Pain Scale: 1-10  With Meds:4/10  Pain Scale: 1-10 Without Meds: 9/10    Potential aberrant drug-related behavior:  ?? Aberrant behavior identified? NO  ?? Potential aberrant behavior identified? NO  ?? Reports loss are stolen prescriptions? NO  ?? Insist on  certain medications by name? NO  ?? Purposeful oversedation? NO  ?? Increased dose without authorization? NO  ?? MED: weaned to 82.5  ?? Last UDS: 03/27/17 + MOR c/w hydrocodone--per note, not scanned  ?? ORT/pain contract: 04-02-18  ORT=0    Side Effects: Denies    Past Medical History: Medical history form was reviewd today & scanned into the Media tab  Past Medical History:   Diagnosis Date   ??? Acid reflux    ??? CAD (coronary artery disease)    ??? Cancer (HCC)     PROSTATE CANCER   ??? Chronic pain     neck, shoulder, and back   ??? Diabetes mellitus (HCC)    ??? HOH (hard of hearing)     hearing imparied   ??? Hypertension    ??? Pancreatitis 10/15/11        REVIEW OF SYSTEMS:   CONSTITUTIONAL: Denies unexplained weight loss, fevers, chills   NEUROLOGIC: Denies tremors or seizures         PHYSICAL EXAM:    Vitals: Blood pressure (!) 144/79, pulse 94, height 5' 11.58" (1.818 m), weight 190 lb 0.6 oz (86.2 kg).    GENERAL EXAM:  ?? General Apparence: Patient is adequately groomed with  no evidence of malnutrition.  ?? Orientation: The patient is oriented to time, place and person.   ?? Mood & Affect:The patient's mood and affect are appropriate  ?? Lymphatic: The lymphatic examination bilaterally reveals all areas to be without enlargement or induration  ?? Sensation: Sensation is intact without deficit  LUMBAR/SACRAL EXAMINATION:  ?? Inspection: Local inspection shows no step-off or bruising. Mild kyphosis       ?? Palpation:   No evidence of tenderness at the midline.    ?? Range of Motion: Full flexion today, 5-10 degrees extension    ?? Strength:   Strength testing is 5/5 in all muscle groups tested.   ?? Special Tests: Straight leg raise and crossed SLR negative.  Leg length and pelvis level.  0 out of 5 Waddell's signs.        ?? Skin: There are no rashes, ulcerations or lesions.  ?? Reflexes: Reflexes are symmetrically 1+at the patellar and ankle tendons.  Clonus absent bilaterally at the feet.  ?? Gait & station: forward flexed unassisted       ?? Additional Examinations: His neck exam shows loss of rotation and flexion and extension by 30%  ?? LOWER EXTREMITY: Inspection/examination of the right lower extremity does not show any tenderness, deformity or injury. Range of motion is full. There is no gross instability.  There are no rashes, ulcerations or lesions. Strength and tone are normal.  ?? LEFT LOWER EXTREMITY:  Inspection/examination of the left lower extremity does not show any tenderness, deformity or injury. Range of motion is full. There is no gross instability. There are no rashes, ulcerations or lesions.  Strength and tone are normal.    Diagnostic Testing:   UDS 03/27/17 + MOR c/w hydrocodone  OARRs: he did have a short RX for norco from a dentist following dental work     Updated lumbar MRI 06/23/2017 ordered by Dr. Sande Brothers  Interval postoperative changes of left laminectomy and facetectomy at L2-L3 with enhancing    granulation tissue extending into the operative defect and left ventrolateral epidural space.    There is reduced disc material in the left subarticular zone at this    level.   ??   Multilevel degenerative disc disease and facet arthrosis as detailed, causing moderate central    canal stenosis at L2-L3 and L4-L5 with mild to moderate central canal stenosis at L3-L4.   ??   Multilevel foraminal stenosis, with moderate bilateral L2 and L3 foraminal stenosis, moderate    left and moderate to severe right L4 foraminal stenosis, and moderate left, severe right L5    foraminal stenosis.   ??     His recent lumbar MRI report reviewed showing significant lumbar stenosis L2-3 and L4-5  ??  08/02/16??MRI cervical spine: ??  IMPRESSION]   ????   ????   Broad-based disc bulging/posterior osteophytic ridging with midline disc protrusions at the    C3-4 and C4-5 levels with moderate to severe narrowing of the central canal.   ????   Facet arthropathy at C5-6 and to a lesser degree C6-7 with neural foraminal narrowings as    described.   ????   ??  ??UDS June  2016 positive for OxyIR as expected      ??  Impression:  1) Chronic lbp,2) Severe lumbar stenosis L2-3,  s/p MLL w/Dr. Sande Brothers  3) Chronic neck/trap pain w/underlying spondylosis  4) H/o prostate cancer  5) Opioid maintenance, low risk ORT=0  Plan:      He'll restart physical therapy for his back which it helped him previously    If his back pain persists he can call directly to schedule bilateral L4 5 transforaminal epidural    He'll otherwise follow up for routine medication maintenance     Dr. Donato Schultz

## 2018-06-25 ENCOUNTER — Ambulatory Visit
Admit: 2018-06-25 | Discharge: 2018-06-25 | Payer: MEDICARE | Attending: Physical Medicine & Rehabilitation | Primary: Internal Medicine

## 2018-06-25 DIAGNOSIS — G894 Chronic pain syndrome: Secondary | ICD-10-CM

## 2018-06-25 MED ORDER — HYDROCODONE-ACETAMINOPHEN 7.5-325 MG PO TABS
7.5-325 MG | ORAL_TABLET | Freq: Three times a day (TID) | ORAL | 0 refills | Status: AC | PRN
Start: 2018-06-25 — End: 2018-08-15

## 2018-06-25 MED ORDER — FENTANYL 25 MCG/HR TD PT72
25 MCG/HR | MEDICATED_PATCH | TRANSDERMAL | 0 refills | Status: AC
Start: 2018-06-25 — End: 2018-08-15

## 2018-06-25 MED ORDER — HYDROCODONE-ACETAMINOPHEN 7.5-325 MG PO TABS
7.5-325 MG | ORAL_TABLET | Freq: Three times a day (TID) | ORAL | 0 refills | Status: AC | PRN
Start: 2018-06-25 — End: 2018-09-13

## 2018-06-25 MED ORDER — FENTANYL 25 MCG/HR TD PT72
25 MCG/HR | MEDICATED_PATCH | TRANSDERMAL | 0 refills | Status: AC
Start: 2018-06-25 — End: 2018-09-13

## 2018-06-25 NOTE — Progress Notes (Signed)
 Follow up: SPINE    CHIEF COMPLAINT:    Chief Complaint   Patient presents with   ??? Back Pain       HISTORY OF PRESENT ILLNESS:                The patient is a 84 y.o. male s/p MLL L2-3 with Dr. Jefm Petty from 11-2016,,    He recently had a melanoma resection from his back that we had noticed his last epidural.  He still has some stiffness in his neck and shoulders.  He has low back pain as well.  He has no extremity rating pain is his arms or leg.  Functionally he is able to be active in the community with his family members and travel    He is leaving for [place] for at least 1 month with his son who is with him today    Pain Assessment  Location of Pain: Neck  Severity of Pain: 4  Quality of Pain: Sharp, Dull, Aching  Duration of Pain: Persistent  Frequency of Pain: Constant  Aggravating Factors: Stairs, Walking, Standing, Squatting, Kneeling, Exercise, Straightening, Stretching, Bending  Limiting Behavior: Yes  Relieving Factors: Rest  Result of Injury: No  Work-Related Injury: No  Are there other pain locations you wish to document?: No]      Past/Current Treatment:   PT: Yes  Meds: Current Duragesic 25 ??g q.72 hours, hydrocodone 7.5 mg TID PRN    Prior: Oral steroids, Gabapentin   Injection:   01/03/15: Lt C7-T1 CESI  12/04/15: Lt C7-T1 CESI   08/16/16: Lt C7-T1 CESI--60%  -B L3 TX ESI--Dr. Arlington Calix  09/10/16 Bil L4-5 TX ESI--50%  02/17/17 Lt C7/T1 CESI   07/15/17 Bilateral L4 5 TX ESI #3 --no significant improvement   Sx: Shoulder, Dr. Paulina Fusi; s/p MLL Dr. Sande Brothers December 2017 with recent consult recommending ESI prior to further surgery    Function-Does the pain medication improve your ability to do:   ?? Personal care: Yes  ?? Housework: Yes   ?? Physical activity: Yes, short community distances   ?? Social activity: Yes    Pain Scale: 1-10  With Meds:4/10  Pain Scale: 1-10 Without Meds: 9/10    Potential aberrant drug-related behavior:  ?? Aberrant behavior identified? NO  ?? Potential aberrant behavior  identified? NO  ?? Reports loss are stolen prescriptions? NO  ?? Insist on certain medications by name? NO  ?? Purposeful oversedation? NO  ?? Increased dose without authorization? NO  ?? MED: weaned to 82.5  ?? Last UDS: 03/27/17 + MOR c/w hydrocodone-  ?? ORT/pain contract: 04-02-18  ORT=0    Side Effects: Denies    Past Medical History: Medical history form was reviewd today & scanned into the Media tab  Past Medical History:   Diagnosis Date   ??? Acid reflux    ??? CAD (coronary artery disease)    ??? Cancer (HCC)     PROSTATE CANCER   ??? Chronic pain     neck, shoulder, and back   ??? Diabetes mellitus (HCC)    ??? HOH (hard of hearing)     hearing imparied   ??? Hypertension    ??? Pancreatitis 10/15/11        REVIEW OF SYSTEMS:   CONSTITUTIONAL: Denies unexplained weight loss, fevers, chills   NEUROLOGIC: Denies tremors or seizures         PHYSICAL EXAM:    Vitals: Blood pressure (!) 144/79, pulse 94, height 5' 11.58" (1.818  m), weight 190 lb 0.6 oz (86.2 kg).    GENERAL EXAM:  ?? General Apparence: Patient is adequately groomed with no evidence of malnutrition.  ?? Orientation: The patient is oriented to time, place and person.   ?? Mood & Affect:The patient's mood and affect are appropriate  ?? Lymphatic: The lymphatic examination bilaterally reveals all areas to be without enlargement or induration  ?? Sensation: Sensation is intact without deficit  CERVICAL EXAMINATION:  ?? Inspection: Local inspection shows no step-off or bruising.  Cervical alignment is normal.     ?? Palpation: No evidence of tenderness at the midline, and trapezius.  Paraspinal tenderness is present. There is no step-off or paraspinal spasm.   ?? Range of Motion: He has at least 50% loss of flexion extension also rotation left and right  ?? Strength: 5/5 bilateral upper extremities   ?? Special Tests:    ??   Spurling's, L'Hermitte's & Hoffman's negative bilaterally.   ??   Hawkins and Impingement tests are positive bilaterally left greater than right  ??  .       ?? Skin:There are no rashes, ulcerations or lesions in right & left upper extremities.  ?? Reflexes: Bilaterally triceps, biceps and brachioradialis are 2+.  Clonus absent bilaterally at the feet.    ?? Additional Examinations:       ?? RIGHT UPPER EXTREMITY:  Inspection/examination of the right upper extremity does not show any tenderness, deformity or injury. Range of motion is diminished by 30% of the shoulder there is no gross instability.  There are no rashes, ulcerations or lesions. Strength and tone are normal.  ?? LEFT UPPER EXTREMITY: Inspection/examination of the left upper extremity does not show any tenderness, deformity or injury. Range of motion is minutes 20 to 30% at the there is no gross instability.  There are no rashes, ulcerations or lesions. Strength and tone are normal.  ??     Diagnostic Testing:   UDS 03/27/17 + MOR c/w hydrocodone  OARRs: he did have a short RX for norco from a dentist following dental work     Updated lumbar MRI 06/23/2017 ordered by Dr. Sande Brothers  Interval postoperative changes of left laminectomy and facetectomy at L2-L3 with enhancing    granulation tissue extending into the operative defect and left ventrolateral epidural space.    There is reduced disc material in the left subarticular zone at this    level.   ??   Multilevel degenerative disc disease and facet arthrosis as detailed, causing moderate central    canal stenosis at L2-L3 and L4-L5 with mild to moderate central canal stenosis at L3-L4.   ??   Multilevel foraminal stenosis, with moderate bilateral L2 and L3 foraminal stenosis, moderate    left and moderate to severe right L4 foraminal stenosis, and moderate left, severe right L5    foraminal stenosis.   ??     His recent lumbar MRI report reviewed showing significant lumbar stenosis L2-3 and L4-5  ??  08/02/16??MRI cervical spine: ??  IMPRESSION]   ????   ????   Broad-based disc bulging/posterior osteophytic ridging with midline disc protrusions at the    C3-4 and C4-5 levels  with moderate to severe narrowing of the central canal.   ????   Facet arthropathy at C5-6 and to a lesser degree C6-7 with neural foraminal narrowings as    described.   ????   ??  ??UDS June 2016 positive for OxyIR as expected      ??  Impression:  1) Chronic lbp,  2) Severe lumbar stenosis L2-3,  s/p MLL w/Dr. Sande BrothersFerree  3) Chronic neck/trap pain w/underlying spondylosis  4) H/o prostate cancer  5) Opioid maintenance, low risk ORT=0       Plan:      He is here early for his scripts and he is provided 2 months of the Duragesic 25 mcg every 72 hours #10 and hydrocodone 7.5 3 times daily #90.  [place]  Despite his higher MED dosing at 84, remains functional with adequate pain control without side effects.  He is a compliant patient      Objective Goals: Physically and mentally function, carry on ADLs and achieve quality of life     Rationale for medication choice and dosage: To improve physical functionality, perform ADLS and achieve quality of life.    The risks and use of maintenance opiate medication were reviewed.  These include the risk of tolerance, addition, and abuse.  Potential side effects were also discussed. Informed verbal consent was obtained.   Medications are to be taken as prescribed and not escalated without prior agreement.  OARRS/KASPER reviewed & appropriate.   Opiate medications will only be prescribed through the office of Dr Carmon GinsbergF. Donato Schultzlifford Zerina Hallinan, M. D. unless notified otherwise         Goals of the current treatment regimen include: improvement in pain, improvement in overall in physical performance, ability to perform daily activities, work or disability, emotional distress, health care utilization and decreased medication consumption.     Progress will be monitored towards achieving/maintaining therapeutic goals:    1. Improving perceived interference of pain with ADL's, ability to perform HEP, continue to improve in overall flexibility, ROM, strength and endurance,  ability to do household activities indoor and/or outdoor, work and social/leisure activities. Improve psychosocial and physical functioning.    2. Improving sleep to 6-7 hours a night. Improve mood/ anxiety and depression symptoms    3. Reduction of reliance on opioid analgesia/more appropriate opioid use. Utilization of adjuvant medications as appropriate.     Risks and benefits of the medications and alternative treatments have been discussed with the patient. The patient was advised against drinking alcohol with the opioid pain medicines, advised against driving or handling machinery when starting or adjusting the dose of medicines, feeling groggy or drowsy, or if having any cognitive issues related to the current medications. The patient is fully aware of the risk of overdose and death, if medicines are misused and not taken as prescribed.  Discussed patient's responsibility to safely store and appropriately dispose up medication.  Prescription for Narcan offered. If the patient develops new symptoms or if the symptoms worsen, the patient was told to call the office.         Dr. Donato Schultzlifford  [place]

## 2018-09-03 ENCOUNTER — Encounter: Attending: Physical Medicine & Rehabilitation | Primary: Internal Medicine

## 2018-09-10 ENCOUNTER — Ambulatory Visit
Admit: 2018-09-10 | Discharge: 2018-09-10 | Payer: MEDICARE | Attending: Physical Medicine & Rehabilitation | Primary: Internal Medicine

## 2018-09-10 DIAGNOSIS — G894 Chronic pain syndrome: Secondary | ICD-10-CM

## 2018-09-10 MED ORDER — HYDROCODONE-ACETAMINOPHEN 7.5-325 MG PO TABS
7.5-325 MG | ORAL_TABLET | Freq: Three times a day (TID) | ORAL | 0 refills | Status: AC
Start: 2018-09-10 — End: 2018-12-05

## 2018-09-10 MED ORDER — FENTANYL 25 MCG/HR TD PT72
25 MCG/HR | MEDICATED_PATCH | TRANSDERMAL | 0 refills | Status: AC
Start: 2018-09-10 — End: 2018-11-07

## 2018-09-10 MED ORDER — HYDROCODONE-ACETAMINOPHEN 7.5-325 MG PO TABS
7.5-325 MG | ORAL_TABLET | Freq: Three times a day (TID) | ORAL | 0 refills | Status: AC
Start: 2018-09-10 — End: 2018-10-10

## 2018-09-10 MED ORDER — FENTANYL 25 MCG/HR TD PT72
25 MCG/HR | MEDICATED_PATCH | TRANSDERMAL | 0 refills | Status: AC
Start: 2018-09-10 — End: 2018-10-10

## 2018-09-10 MED ORDER — FENTANYL 25 MCG/HR TD PT72
25 MCG/HR | MEDICATED_PATCH | TRANSDERMAL | 0 refills | Status: AC
Start: 2018-09-10 — End: 2018-12-05

## 2018-09-10 MED ORDER — HYDROCODONE-ACETAMINOPHEN 7.5-325 MG PO TABS
7.5-325 MG | ORAL_TABLET | Freq: Three times a day (TID) | ORAL | 0 refills | Status: AC
Start: 2018-09-10 — End: 2018-11-07

## 2018-09-10 NOTE — Progress Notes (Addendum)
 Follow up: SPINE    CHIEF COMPLAINT:    Chief Complaint   Patient presents with   ??? Back Pain       HISTORY OF PRESENT ILLNESS:                The patient is a 84 y.o. male s/p MLL L2-3 with Dr. Jefm Petty from 11-2016,    : For chronic neck pain and chronic back pain.  Feels his pain is tolerable.  He spent the last few months with his son in [place].  His last 2 prescriptions filled there.  His oarrs reflects this.    Reports neck pain and back pain both worse with activity.  He denies any new or worsening radiating pain.  He reports no side effects effects.  He sleeps 6 hours a night.  He is considering a move more permanent [place]    Pain Assessment  Location of Pain: Neck  Severity of Pain: 4  Quality of Pain: Sharp, Dull, Aching  Duration of Pain: Persistent  Frequency of Pain: Constant  Aggravating Factors: Stairs, Walking, Standing, Squatting, Kneeling, Exercise, Straightening, Stretching, Bending  Limiting Behavior: Yes  Relieving Factors: Rest  Result of Injury: No  Work-Related Injury: No  Are there other pain locations you wish to document?: No]      Past/Current Treatment:   PT: Yes  Meds: Current Duragesic 25 ??g q.72 hours, hydrocodone 7.5 mg TID PRN    Prior: Oral steroids, Gabapentin   Injection:   01/03/15: Lt C7-T1 CESI  12/04/15: Lt C7-T1 CESI   08/16/16: Lt C7-T1 CESI--60%  -B L3 TX ESI--Dr. Arlington Calix  09/10/16 Bil L4-5 TX ESI--50%  02/17/17 Lt C7/T1 CESI   07/15/17 Bilateral L4 5 TX ESI #3 --no significant improvement   Sx: Shoulder, Dr. Paulina Fusi; s/p MLL Dr. Sande Brothers December 2017 with recent consult recommending ESI prior to further surgery    Function-Does the pain medication improve your ability to do:   ?? Personal care: Yes  ?? Housework: Yes   ?? Physical activity: Yes, short community distances   ?? Social activity: Yes    Pain Scale: 1-10  With Meds:4/10  Pain Scale: 1-10 Without Meds: 9/10    Potential aberrant drug-related behavior:  ?? Aberrant behavior identified? NO  ?? Potential  aberrant behavior identified? NO  ?? Reports loss are stolen prescriptions? NO  ?? Insist on certain medications by name? NO  ?? Purposeful oversedation? NO  ?? Increased dose without authorization? NO  ?? MED: weaned to 82.5  ?? Last UDS: 03/27/17 + MOR c/w hydrocodone-  ?? ORT/pain contract: 04-02-18  ORT=0    Side Effects: Denies    Past Medical History: Medical history form was reviewd today & scanned into the Media tab  Past Medical History:   Diagnosis Date   ??? Acid reflux    ??? CAD (coronary artery disease)    ??? Cancer (HCC)     PROSTATE CANCER   ??? Chronic pain     neck, shoulder, and back   ??? Diabetes mellitus (HCC)    ??? HOH (hard of hearing)     hearing imparied   ??? Hypertension    ??? Pancreatitis 10/15/11        REVIEW OF SYSTEMS:   CONSTITUTIONAL: Denies unexplained weight loss, fevers, chills   NEUROLOGIC: Denies tremors or seizures         PHYSICAL EXAM:    Vitals: Blood pressure (!) 144/79, pulse 94, height 5' 11.58" (1.818  m), weight 190 lb 0.6 oz (86.2 kg).    GENERAL EXAM:  ?? General Apparence: Patient is adequately groomed with no evidence of malnutrition.  ?? Orientation: The patient is oriented to time, place and person.   ?? Mood & Affect:The patient's mood and affect are appropriate  ?? Lymphatic: The lymphatic examination bilaterally reveals all areas to be without enlargement or induration  ?? Sensation: Sensation is intact without deficit  CERVICAL EXAMINATION:  ?? Inspection: Local inspection shows no step-off or bruising.  Cervical alignment is normal.     ?? Palpation: No evidence of tenderness at the midline, and trapezius.  Paraspinal tenderness is present. There is no step-off or paraspinal spasm.   ?? Range of Motion: He has at least 50% loss of flexion extension also rotation left and right  ?? Strength: 5/5 bilateral upper extremities   ?? Special Tests:    ??   Spurling's, L'Hermitte's & Hoffman's negative bilaterally.   ??   Hawkins and Impingement tests are positive bilaterally left greater than  right  ??  .      ?? Skin:There are no rashes, ulcerations or lesions in right & left upper extremities.  ?? Reflexes: Bilaterally triceps, biceps and brachioradialis are 2+.  Clonus absent bilaterally at the feet.    ?? Additional Examinations:       ?? RIGHT UPPER EXTREMITY:  Inspection/examination of the right upper extremity does not show any tenderness, deformity or injury. Range of motion is diminished by 30% of the shoulder there is no gross instability.  There are no rashes, ulcerations or lesions. Strength and tone are normal.  ?? LEFT UPPER EXTREMITY: Inspection/examination of the left upper extremity does not show any tenderness, deformity or injury. Range of motion is minutes 20 to 30% at the there is no gross instability.  There are no rashes, ulcerations or lesions. Strength and tone are normal.  ?? He has intact 5 out of 5 lower extremity strength, he has at least 50% loss of lumbar range of motion    Diagnostic Testing:   UDS 03/27/17 + MOR c/w hydrocodone  OARRs: he did have a short RX for norco from a dentist following dental work     Updated lumbar MRI 06/23/2017 ordered by Dr. Sande BrothersFerree  Interval postoperative changes of left laminectomy and facetectomy at L2-L3 with enhancing    granulation tissue extending into the operative defect and left ventrolateral epidural space.    There is reduced disc material in the left subarticular zone at this    level.   ??   Multilevel degenerative disc disease and facet arthrosis as detailed, causing moderate central    canal stenosis at L2-L3 and L4-L5 with mild to moderate central canal stenosis at L3-L4.   ??   Multilevel foraminal stenosis, with moderate bilateral L2 and L3 foraminal stenosis, moderate    left and moderate to severe right L4 foraminal stenosis, and moderate left, severe right L5    foraminal stenosis.   ??     His recent lumbar MRI report reviewed showing significant lumbar stenosis L2-3 and L4-5  ??  08/02/16??MRI cervical spine: ??  IMPRESSION]   ????   ????    Broad-based disc bulging/posterior osteophytic ridging with midline disc protrusions at the    C3-4 and C4-5 levels with moderate to severe narrowing of the central canal.   ????   Facet arthropathy at C5-6 and to a lesser degree C6-7 with neural foraminal narrowings as  described.   ????   ??  ??UDS June 2016 positive for OxyIR as expected      ??  Impression:  1) Chronic lbp,  2) Severe lumbar stenosis L2-3,  s/p MLL w/Dr. Sande Brothers  3) Chronic neck/trap pain w/underlying spondylosis  4) H/o prostate cancer  5) Opioid maintenance, low risk ORT=0       Plan:      Orders Placed This Encounter   Medications   ??? fentaNYL (DURAGESIC) 25 MCG/HR     Sig: Place 1 patch onto the skin every 3 days for 30 days. Intended supply: 30 days     Dispense:  10 patch     Refill:  0     Reduce doses taken as pain becomes manageable   ??? fentaNYL (DURAGESIC) 25 MCG/HR     Sig: Place 1 patch onto the skin every 3 days for 30 days. Intended supply: 30 days     Dispense:  10 patch     Refill:  0     Reduce doses taken as pain becomes manageable   ??? fentaNYL (DURAGESIC) 25 MCG/HR     Sig: Place 1 patch onto the skin every 3 days for 30 days. Intended supply: 30 days     Dispense:  10 patch     Refill:  0     Reduce doses taken as pain becomes manageable   ??? HYDROcodone-acetaminophen (NORCO) 7.5-325 MG per tablet     Sig: Take 1 tablet by mouth 3 times daily for 30 days. Intended supply: 30 days     Dispense:  90 tablet     Refill:  0     Reduce doses taken as pain becomes manageable   ??? HYDROcodone-acetaminophen (NORCO) 7.5-325 MG per tablet     Sig: Take 1 tablet by mouth 3 times daily for 30 days. Intended supply: 30 days     Dispense:  90 tablet     Refill:  0     Reduce doses taken as pain becomes manageable   ??? HYDROcodone-acetaminophen (NORCO) 7.5-325 MG per tablet     Sig: Take 1 tablet by mouth 3 times daily for 30 days. Intended supply: 30 days     Dispense:  90 tablet     Refill:  0     Reduce doses taken as pain becomes manageable        Objective Goals: Physically and mentally function, carry on ADLs and achieve quality of life     Rationale for medication choice and dosage: To improve physical functionality, perform ADLS and achieve quality of life.    The risks and use of maintenance opiate medication were reviewed.  These include the risk of tolerance, addition, and abuse.  Potential side effects were also discussed. Informed verbal consent was obtained.   Medications are to be taken as prescribed and not escalated without prior agreement.  OARRS/KASPER reviewed & appropriate.   Opiate medications will only be prescribed through the office of Dr Carmon Ginsberg. Donato Schultz, M. D. unless notified otherwise         Goals of the current treatment regimen include: improvement in pain, improvement in overall in physical performance, ability to perform daily activities, work or disability, emotional distress, health care utilization and decreased medication consumption.     Progress will be monitored towards achieving/maintaining therapeutic goals:    1. Improving perceived interference of pain with ADL's, ability to perform HEP, continue to improve in overall flexibility, ROM, strength and endurance,  ability to do household activities indoor and/or outdoor, work and Research officer, trade union activities. Improve psychosocial and physical functioning.    2. Improving sleep to 6-7 hours a night. Improve mood/ anxiety and depression symptoms    3. Reduction of reliance on opioid analgesia/more appropriate opioid use. Utilization of adjuvant medications as appropriate.     Risks and benefits of the medications and alternative treatments have been discussed with the patient. The patient was advised against drinking alcohol with the opioid pain medicines, advised against driving or handling machinery when starting or adjusting the dose of medicines, feeling groggy or drowsy, or if having any cognitive issues related to the current medications. The patient is fully  aware of the risk of overdose and death, if medicines are misused and not taken as prescribed.  Discussed patient's responsibility to safely store and appropriately dispose up medication.  Prescription for Narcan offered. If the patient develops new symptoms or if the symptoms worsen, the patient was told to call the office.           Dr. Donato Schultz

## 2018-11-19 ENCOUNTER — Ambulatory Visit
Admit: 2018-11-19 | Discharge: 2018-11-19 | Payer: MEDICARE | Attending: Physical Medicine & Rehabilitation | Primary: Internal Medicine

## 2018-11-19 DIAGNOSIS — G894 Chronic pain syndrome: Secondary | ICD-10-CM

## 2018-11-19 MED ORDER — FENTANYL 25 MCG/HR TD PT72
25 MCG/HR | MEDICATED_PATCH | TRANSDERMAL | 0 refills | Status: AC
Start: 2018-11-19 — End: 2019-03-03

## 2018-11-19 MED ORDER — TIZANIDINE HCL 2 MG PO TABS
2 MG | ORAL_TABLET | Freq: Two times a day (BID) | ORAL | 2 refills | Status: AC
Start: 2018-11-19 — End: ?

## 2018-11-19 MED ORDER — FENTANYL 25 MCG/HR TD PT72
25 MCG/HR | MEDICATED_PATCH | TRANSDERMAL | 0 refills | Status: AC
Start: 2018-11-19 — End: 2019-01-04

## 2018-11-19 MED ORDER — HYDROCODONE-ACETAMINOPHEN 7.5-325 MG PO TABS
7.5-325 MG | ORAL_TABLET | Freq: Three times a day (TID) | ORAL | 0 refills | Status: AC | PRN
Start: 2018-11-19 — End: 2019-01-04

## 2018-11-19 MED ORDER — FENTANYL 25 MCG/HR TD PT72
25 MCG/HR | MEDICATED_PATCH | TRANSDERMAL | 0 refills | Status: AC
Start: 2018-11-19 — End: 2019-02-02

## 2018-11-19 MED ORDER — HYDROCODONE-ACETAMINOPHEN 7.5-325 MG PO TABS
7.5-325 MG | ORAL_TABLET | Freq: Three times a day (TID) | ORAL | 0 refills | Status: AC | PRN
Start: 2018-11-19 — End: 2019-03-03

## 2018-11-19 MED ORDER — HYDROCODONE-ACETAMINOPHEN 7.5-325 MG PO TABS
7.5-325 MG | ORAL_TABLET | Freq: Three times a day (TID) | ORAL | 0 refills | Status: AC | PRN
Start: 2018-11-19 — End: 2019-02-02

## 2018-11-19 NOTE — Progress Notes (Signed)
 Follow up: SPINE    CHIEF COMPLAINT:    Chief Complaint   Patient presents with   ??? Back Pain       HISTORY OF PRESENT ILLNESS:                The patient is a 84 y.o. male s/p MLL L2-3 with Dr. Sande BrothersFerree from 11-2016, he has cervical spondylosis and chronic neck and back pain and back pain is most bothersome.  Functionally he still lives alone and functions independently can take all his medicines appropriately.  He is here with his daughter-in-law who lives on his property and separate residents but assures his safety well-being and independence.  He states he is having some episodes of pain with community walking such as going grocery he sleeps up to 6 to 7 hours per night    :  Pain Assessment  Location of Pain: Neck  Severity of Pain: 4  Quality of Pain: Sharp, Dull, Aching  Duration of Pain: Persistent  Frequency of Pain: Constant  Aggravating Factors: Stairs, Walking, Standing, Squatting, Kneeling, Exercise, Straightening, Stretching, Bending  Limiting Behavior: Yes  Relieving Factors: Rest  Result of Injury: No  Work-Related Injury: No  Are there other pain locations you wish to document?: No]      Past/Current Treatment:   PT: Yes  Meds: Current Duragesic 25 ??g q.72 hours, hydrocodone 7.5 mg TID PRN    Prior: Oral steroids, Gabapentin   Injection:   01/03/15: Lt C7-T1 CESI  12/04/15: Lt C7-T1 CESI   08/16/16: Lt C7-T1 CESI--60%  -B L3 TX ESI--Dr. Arlington CalixSingla  09/10/16 Bil L4-5 TX ESI--50%  02/17/17 Lt C7/T1 CESI   07/15/17 Bilateral L4 5 TX ESI #3 --no significant improvement   Sx: Shoulder, Dr. Paulina FusiHess; s/p MLL Dr. Sande BrothersFerree December 2017 with recent consult recommending ESI prior to further surgery    Function-Does the pain medication improve your ability to do:   ?? Personal care: Yes  ?? Housework: Yes   ?? Physical activity: Yes, short community distances   ?? Social activity: Yes    Pain Scale: 1-10  With Meds:4/10  Pain Scale: 1-10 Without Meds: 7-8/10    Potential aberrant drug-related behavior:  ?? Aberrant behavior  identified? NO  ?? Potential aberrant behavior identified? NO  ?? Reports loss are stolen prescriptions? NO  ?? Insist on certain medications by name? NO  ?? Purposeful oversedation? NO  ?? Increased dose without authorization? NO  ?? MED: weaned to 82.5  ?? Last UDS: 03/27/17 + MOR c/w hydrocodone-  ?? ORT/pain contract: 04-02-18  ORT=0    Side Effects: Denies    Past Medical History: Medical history form was reviewd today & scanned into the Media tab  Past Medical History:   Diagnosis Date   ??? Acid reflux    ??? CAD (coronary artery disease)    ??? Cancer (HCC)     PROSTATE CANCER   ??? Chronic pain     neck, shoulder, and back   ??? Diabetes mellitus (HCC)    ??? HOH (hard of hearing)     hearing imparied   ??? Hypertension    ??? Pancreatitis 10/15/11        REVIEW OF SYSTEMS:   CONSTITUTIONAL: Denies unexplained weight loss, fevers, chills   NEUROLOGIC: Denies tremors or seizures         PHYSICAL EXAM:    Vitals: Blood pressure (!) 144/79, pulse 94, height 5' 11.58" (1.818 m), weight 190 lb 0.6 oz (86.2  kg).    GENERAL EXAM:  ?? General Apparence: Patient is adequately groomed with no evidence of malnutrition.  ?? Orientation: The patient is oriented to time, place and person.   ?? Mood & Affect:The patient's mood and affect are appropriate  ?? Lymphatic: The lymphatic examination bilaterally reveals all areas to be without enlargement or induration  ?? Sensation: Sensation is intact without deficit  CERVICAL EXAMINATION:  ?? Inspection: Local inspection shows no step-off or bruising.  Cervical alignment is normal.     ?? Palpation: No evidence of tenderness at the midline, and trapezius.  Paraspinal tenderness is present. There is no step-off or paraspinal spasm.   ?? Range of Motion: He has at least 50% loss of flexion extension also rotation left and right but no pain  ?? Strength: 5/5 bilateral upper extremities   ?? Special Tests:    ??   Spurling's, L'Hermitte's & Hoffman's negative bilaterally.   ??   Hawkins and Impingement tests are  positive bilaterally left greater than right  ??  .      ?? Skin:There are no rashes, ulcerations or lesions in right & left upper extremities.  ?? Reflexes: Bilaterally triceps, biceps and brachioradialis are +1.  Clonus absent bilaterally at the feet.    ?? Additional Examinations:     He has 50% loss of both flexion extension of lumbar spine with stiffness and no pain.  No percussive tenderness.  Inspection notes some listing forward and lumbar spine, 5-5 lower extremity strength, trace reflexes, negative clonus, intact sensation  ?? RIGHT UPPER EXTREMITY:  Inspection/examination of the right upper extremity does not show any tenderness, deformity or injury. Range of motion is diminished by 30% of the shoulder there is no gross instability.  There are no rashes, ulcerations or lesions. Strength and tone are normal.  ?? LEFT UPPER EXTREMITY: Inspection/examination of the left upper extremity does not show any tenderness, deformity or injury. Range of motion is minutes 20 to 30% at the there is no gross instability.  There are no rashes, ulcerations or lesions. Strength and tone are normal.  ?? He has intact 5 out of 5 lower extremity strength, he has at least 50% loss of lumbar range of motion    Diagnostic Testing:   UDS 03/27/17 + MOR c/w hydrocodone  OARRs: he did have a short RX for norco from a dentist following dental work     Updated lumbar MRI 06/23/2017 ordered by Dr. Sande Brothers  Interval postoperative changes of left laminectomy and facetectomy at L2-L3 with enhancing    granulation tissue extending into the operative defect and left ventrolateral epidural space.    There is reduced disc material in the left subarticular zone at this    level.   ??   Multilevel degenerative disc disease and facet arthrosis as detailed, causing moderate central    canal stenosis at L2-L3 and L4-L5 with mild to moderate central canal stenosis at L3-L4.   ??   Multilevel foraminal stenosis, with moderate bilateral L2 and L3 foraminal  stenosis, moderate    left and moderate to severe right L4 foraminal stenosis, and moderate left, severe right L5    foraminal stenosis.   ??     His recent lumbar MRI report reviewed showing significant lumbar stenosis L2-3 and L4-5  ??  08/02/16??MRI cervical spine: ??  IMPRESSION]   ????   ????   Broad-based disc bulging/posterior osteophytic ridging with midline disc protrusions at the    C3-4 and  C4-5 levels with moderate to severe narrowing of the central canal.   ????   Facet arthropathy at C5-6 and to a lesser degree C6-7 with neural foraminal narrowings as    described.   ????   ??  ??UDS June 2016 positive for OxyIR as expected      ??  Impression:  1) Chronic lbp, chronic neck pain  2) Severe lumbar stenosis L2-3,  s/p MLL w/Dr. Sande Brothers  3) Chronic neck/trap pain w/underlying spondylosis  4) H/o prostate cancer  5) Opioid maintenance, low risk ORT=0 ED 84      Plan:      As previously, he is functioning well at the current MED dosing without side effects improve function, improved sleep; is a transfer the patient and I feel comfortable keeping at these doses without escalation    Orders Placed This Encounter   Medications   ??? HYDROcodone-acetaminophen (NORCO) 7.5-325 MG per tablet     Sig: Take 1 tablet by mouth 3 times daily as needed for Pain for up to 30 days.     Dispense:  90 tablet     Refill:  0   ??? HYDROcodone-acetaminophen (NORCO) 7.5-325 MG per tablet     Sig: Take 1 tablet by mouth 3 times daily as needed for Pain for up to 30 days.     Dispense:  90 tablet     Refill:  0   ??? HYDROcodone-acetaminophen (NORCO) 7.5-325 MG per tablet     Sig: Take 1 tablet by mouth 3 times daily as needed for Pain for up to 30 days.     Dispense:  90 tablet     Refill:  0   ??? fentaNYL (DURAGESIC) 25 MCG/HR     Sig: Place 1 patch onto the skin every 3 days for 30 days.     Dispense:  10 patch     Refill:  0   ??? fentaNYL (DURAGESIC) 25 MCG/HR     Sig: Place 1 patch onto the skin every 3 days for 30 days.     Dispense:  10 patch      Refill:  0   ??? fentaNYL (DURAGESIC) 25 MCG/HR     Sig: Place 1 patch onto the skin every 3 days for 30 days.     Dispense:  10 patch     Refill:  0   ??? tiZANidine (ZANAFLEX) 2 MG tablet     Sig: Take 1 tablet by mouth 2 times daily     Dispense:  60 tablet     Refill:  2       Objective Goals: Physically and mentally function, carry on ADLs and achieve quality of life     Rationale for medication choice and dosage: To improve physical functionality, perform ADLS and achieve quality of life.    The risks and use of maintenance opiate medication were reviewed.  These include the risk of tolerance, addition, and abuse.  Potential side effects were also discussed. Informed verbal consent was obtained.   Medications are to be taken as prescribed and not escalated without prior agreement.  OARRS/KASPER reviewed & appropriate.   Opiate medications will only be prescribed through the office of Dr Carmon Ginsberg. Donato Schultz, M. D. unless notified otherwise         Goals of the current treatment regimen include: improvement in pain, improvement in overall in physical performance, ability to perform daily activities, work or disability, emotional distress, health care utilization and decreased medication  consumption.     Progress will be monitored towards achieving/maintaining therapeutic goals:    1. Improving perceived interference of pain with ADL's, ability to perform HEP, continue to improve in overall flexibility, ROM, strength and endurance, ability to do household activities indoor and/or outdoor, work and social/leisure activities. Improve psychosocial and physical functioning.    2. Improving sleep to 6-7 hours a night. Improve mood/ anxiety and depression symptoms    3. Reduction of reliance on opioid analgesia/more appropriate opioid use. Utilization of adjuvant medications as appropriate.     Risks and benefits of the medications and alternative treatments have been discussed with the patient. The patient was advised  against drinking alcohol with the opioid pain medicines, advised against driving or handling machinery when starting or adjusting the dose of medicines, feeling groggy or drowsy, or if having any cognitive issues related to the current medications. The patient is fully aware of the risk of overdose and death, if medicines are misused and not taken as prescribed.  Discussed patient's responsibility to safely store and appropriately dispose up medication.  Prescription for Narcan offered. If the patient develops new symptoms or if the symptoms worsen, the patient was told to call the office.           Dr. Donato Schultz

## 2019-02-11 ENCOUNTER — Encounter: Attending: Physical Medicine & Rehabilitation | Primary: Internal Medicine

## 2019-02-25 ENCOUNTER — Encounter: Attending: Physical Medicine & Rehabilitation | Primary: Internal Medicine

## 2019-02-26 ENCOUNTER — Encounter: Attending: Physical Medicine & Rehabilitation | Primary: Internal Medicine

## 2019-03-01 NOTE — Telephone Encounter (Signed)
MAILED THE MEDICAL RECORDS TO [facility] TO THE ADDRESS ON REQUEST.  FAX WOULD NOT GO THROUGH.

## 2019-03-01 NOTE — Telephone Encounter (Signed)
FAXED [facility] COMPLETE MEDICAL RECORDS TO [facility] @ [phone]

## 2019-03-03 ENCOUNTER — Ambulatory Visit
Admit: 2019-03-03 | Discharge: 2019-03-03 | Payer: MEDICARE | Attending: Orthopaedic Surgery | Primary: Internal Medicine

## 2019-03-03 ENCOUNTER — Ambulatory Visit: Admit: 2019-03-03 | Discharge: 2019-03-03 | Payer: MEDICARE | Primary: Internal Medicine

## 2019-03-03 DIAGNOSIS — M25562 Pain in left knee: Secondary | ICD-10-CM

## 2019-03-03 NOTE — Progress Notes (Signed)
03/03/19  History of Present Illness:                             Jordan Gutierrez is a 85 y.o. male being seen today for the first time he fell and injured his knee 10 days ago left knee      Chief complaint presented in the office today: Left knee    Location left knee  Severity moderate  Duration 10 days  Associated sign/symptoms pain, swelling, trouble getting full extension    I have reviewed and discussed the below Pain assessment findings with the patient.       Medical History  Patient's medications, allergies, past medical, surgical, social and family histories were reviewed and updated as appropriate.    Past Medical History:   Diagnosis Date   ??? Acid reflux    ??? CAD (coronary artery disease)    ??? Cancer (HCC)     PROSTATE CANCER   ??? Chronic pain     neck, shoulder, and back   ??? Diabetes mellitus (HCC)    ??? HOH (hard of hearing)     hearing imparied   ??? Hypertension    ??? Pancreatitis 10/15/11     Family History   Problem Relation Age of Onset   ??? Cancer Mother    ??? Cancer Father      Social History     Socioeconomic History   ??? Marital status: Widowed     Spouse name: None   ??? Number of children: None   ??? Years of education: None   ??? Highest education level: None   Occupational History   ??? None   Social Needs   ??? Financial resource strain: None   ??? Food insecurity:     Worry: None     Inability: None   ??? Transportation needs:     Medical: None     Non-medical: None   Tobacco Use   ??? Smoking status: Former Smoker     Types: Cigarettes     Last attempt to quit: 12/30/1958     Years since quitting: 60.2   ??? Smokeless tobacco: Never Used   Substance and Sexual Activity   ??? Alcohol use: No   ??? Drug use: No   ??? Sexual activity: None   Lifestyle   ??? Physical activity:     Days per week: None     Minutes per session: None   ??? Stress: None   Relationships   ??? Social connections:     Talks on phone: None     Gets together: None     Attends religious service: None     Active member of club or organization: None      Attends meetings of clubs or organizations: None     Relationship status: None   ??? Intimate partner violence:     Fear of current or ex partner: None     Emotionally abused: None     Physically abused: None     Forced sexual activity: None   Other Topics Concern   ??? None   Social History Narrative   ??? None     Current Outpatient Medications   Medication Sig Dispense Refill   ??? HYDROcodone-acetaminophen (NORCO) 7.5-325 MG per tablet Take 1 tablet by mouth 3 times daily as needed for Pain for up to 30 days. 90 tablet 0   ??? fentaNYL (DURAGESIC) 25 MCG/HR  Place 1 patch onto the skin every 3 days for 30 days. 10 patch 0   ??? tiZANidine (ZANAFLEX) 2 MG tablet Take 1 tablet by mouth 2 times daily 60 tablet 2   ??? docusate sodium (COLACE) 100 MG capsule Take 1 capsule by mouth 2 times daily as needed for Constipation 30 capsule 0   ??? escitalopram (LEXAPRO) 20 MG tablet 20 mg daily      ??? NEXIUM 40 MG delayed release capsule every morning (before breakfast)      ??? lisinopril (PRINIVIL;ZESTRIL) 5 MG tablet Take 5 mg by mouth daily      ??? TOPROL XL 25 MG extended release tablet Take 25 mg by mouth daily      ??? tamsulosin (FLOMAX) 0.4 MG capsule Take 0.4 mg by mouth daily      ??? aspirin 81 MG tablet Take 81 mg by mouth daily     ??? pregabalin (LYRICA) 50 MG capsule Take 50 mg by mouth 2 times daily.     ??? finasteride (PROSCAR) 5 MG tablet Take 5 mg by mouth daily.     ??? METFORMIN HCL PO Take  by mouth. TAKE 2 TABLETS TWICE A DAY      ??? ZOLPIDEM TARTRATE PO Take  by mouth nightly.         No current facility-administered medications for this visit.      No Known Allergies    REVIEW OF SYSTEMS:   No acute rash  No numbness  No tingling  No fevers  No depression    Pertinent items are noted in HPI  Review of systems reviewed from Patient History Form dated on 03/03/2019 and available in the patient's chart under the Media tab.                                            Examination:  General Exam:    Vitals: Height 5\' 10"  (1.778 m),  weight 184 lb 15.5 oz (83.9 kg).  Constitutional: Patient is adequately groomed with no evidence of malnutrition  Mental Status: The patient is oriented to time, place and person.  The patient's mood and affect are appropriate.  Lymphatic: The lymphatic examination bilaterally reveals all areas to be without enlargement or induration.  Vascular: Examination reveals no swelling or calf tenderness.  Peripheral pulses are palpable and 2+.  Neurological: The patient has good coordination.  There is no weakness or sensory deficit.    Skin:    Head/Neck: inspection reveals no rashes, ulcerations or lesions.  Trunk:  inspection reveals no rashes, ulcerations or lesions.  Right Lower Extremity: inspection reveals no rashes, ulcerations or lesions.  Left Lower Extremity: inspection reveals no rashes, ulcerations or lesions.          PHYSICAL EXAM:      Knee Examination  Inspection:  No abrasions no lacerations no signs of infection or obvious deformity moderate obvious  swelling and joint effusion     Palpation:   Palpation reveals mild pain along the medial joint line,   Mild lateral pain along the joint line , mild joint effusion noted today.    Range of Motion: 0-100 degrees flexion/ extension   Hip extension to 20 hip flexion to 70+  Lumbar ROM -20 extension flexion to 6 inches from the floor      Strength: Quadriceps testing 5/5 , hamstring muscle testing  5/5, EHL against resistance is 5/5, hip flexor strength is intact 5/5, internal and external rotation of the hip against resistance is also intact 5/5    Special Tests: stable Lachman, negative anterior drawer, negative pivot shift, no posterior sag no posterior drawer does not open to valgus or varus stress at 0 or 30??, Steinmann's negative, McMurray's negative, Homans negative Moses negative, pedal pulses are +2/4 capillary refill is brisk sensation is intact ankle exam and hip exam are shows no pain with full range of motion provocative testing of the hip is  nonpainful muscle testing around the hip is 5 over 5.  Pain with range of motion, moderate pain trying to get full extension, patellofemoral crepitus     lumbar flexion to 6 inches from floor with out pain    Gait: antalgic     Reflex:    Lower extremity Deep tendon reflexes +2/4 and equal bilaterally for patella and Achilles  Upper extremity reflexes:  of the biceps, triceps, brachioradialis +2/4 equal bilaterally    Contralateral  Knee: Negative Lachman negative anterior drawer negative pivot shift no posterior sag no posterior drawer does not open to valgus or varus stress at 0 or 30?? negative Steinmann's, negative McMurray's, negative Homans, negative Moses, pedal pulses are +2/4 capillary refill is brisk sensation is intact ankle exam and hip exam are shows no pain with full range of motion provocative testing of the hip is nonpainful muscle testing around the hip is 5 over 5.      Lumbar exam:    L1: good strength of the iliopsoas muscle upper lateral thigh sensation is intact  L2: good strength of the iliopsoas muscle and medial epicondyle sensation is intact Lateral thigh sensation is intact  L3: good strength with out pain of obturator externus muscle sensation is intact to medial epicondyle of the femur   L4:Good quadratus strength and gluteus medius and minimus strength, sensation is intact to medial malleolus  L5:Intact Extensor hallucis and tibialis posterior strength, sensation is intact to dorsum of the foot.   S1:  Plantar foot sensation is intact  S2:  Posterior thigh and calf sensation is intact      Hip and lumbar testing does not reproduce pain provocative testing does not reproduce the symptomatology.        Additional Examinations:  Right Upper Extremity:  Examination of the right upper extremity does not show any tenderness, deformity or injury.  Range of motion is unremarkable.  There is no gross instability.  There are no rashes, ulcerations or lesions.  Strength and tone are normal.  Left  Upper Extremity: Examination of the left upper extremity does not show any tenderness, deformity or injury.  Range of motion is unremarkable.  There is no gross instability.  There are no rashes, ulcerations or lesions.  Strength and tone are normal.          IMPRESSION:    Diagnostic testing:  X-rays reviewed in office, I independently reviewed the films in the office today:  I reviewed multiple X-rays of the left knee today, anterior posterior, lateral, notch view, skyline view:  Show nondisplaced transverse patella fracture no dislocation no signs of any significant arthritic wear, good joint space maintenance, no masses or tumors, no signs of any significant osteopenia, no obvious OCD lesions, no loose bodies seen.       Impression nondisplaced transverse patella fracture  MRI: Ordered today  Labs: None ordered      Past Surgical History:  Procedure Laterality Date   ??? CARDIAC SURGERY  2012    CABG   ??? CORONARY ANGIOPLASTY WITH STENT PLACEMENT  2012   ??? EPIDURAL STEROID INJECTION Left 04/20/2018    LEFT CERVICAL SEVEN THORACIC ONE EPIDURAL STEROID INJECTION SITE CONFIRMED BY FLUOROSCOPY performed by Pierre Bali, MD at [facility] [place]   ??? MANDIBLE FRACTURE SURGERY  10/15/11   ??? OTHER SURGICAL HISTORY  12/06/2016    Bilateral L2-3 hemilaminotomy, partial facetectomy and foraminotomy   ??? PATELLA FRACTURE SURGERY  10/15/11   ??? PR NJX DX/THER SBST INTRLMNR CRV/THRC W/IMG GDN Bilateral 07/15/2017    BILATERAL LUMBAR FOUR FIVE EPIDURAL STEROID INJECTION SITE CONFIRMED BY FLUOROSCOPY performed by Pierre Bali, MD at [facility] [place]   ??? PROSTATE SURGERY      cancer   ??? ROTATOR CUFF REPAIR Bilateral    .    Office Procedures:  Orders Placed This Encounter   Procedures   ??? XR KNEE LEFT (MIN 4 VIEWS)     Standing Status:   Future     Number of Occurrences:   1     Standing Expiration Date:   03/02/2020       Previous Treatments: Ice, rest, X-ray, anti-inflammatories,    Differential diagnosis: Medial meniscal  tear, Lateral meniscal tear, Synovitis,  Loose body, stress fracture, patella femoral syndrome, osteoarthritis, chondral lesion, ACL tear, PCL injury, MCL or LCL injury, Capsular injury, infection, contusion, hip pathology, lumbar radiculopathy, Muscle injury, bone tumor, fracture, femoral acetabular osteoarthritis,    Diagnosis: Patella fracture        Plan: (Medical Decision Making)    1. Medications -none  2. PT -not at this time  3. Further imaging -MRI  4. Follow up -brace in full extension and follow-up after MRI    Macky Lower. Burnell Matlin, D.O.  [facility]  Occupational hygienist  [employer] Team Physician          Disclaimer:    "This note was dictated with Chemical engineer. Though review and correction are routine, we apologize for any errors."

## 2019-03-03 NOTE — Addendum Note (Signed)
Addended by: Pearla Dubonnet on: 03/03/2019 04:52 PM     Modules accepted: Orders

## 2019-03-04 NOTE — Telephone Encounter (Signed)
03/04/19 DME H4742   NO PRECERT REQUIRED - PATIENT HAS MEDICARE AND E-VERIFIED  -  PER Epic -  MP

## 2019-03-08 NOTE — Telephone Encounter (Signed)
RETURNED CALL FROM PATIENTS SONE REGARDING HIS PAIN MED NEEDING REFILLS.  LET HIM KNOW HE HAS TO BE SEEN IN OFFICE FOR NEW SCRIPTS TO BE DISPENSED.Marland Kitchen        THEY HAVE ACTUALLY REQUESTED ALL OF TE RECORFD TO BE SENT TO PAIN [facility]GM[facility] MD IN IN [place], WHICH I SEE HAS BEEN DONE,    I EXPLAINED THAT SINCE HE WILL BE TREATING AND POSSIBLY GETTING MEDS FROM THAT PHYSICIAN WE WOULD NOT BE ABLE TO GIVE PAIN MEDS AS WELL,.    VOICED UNDERSTANDING

## 2019-03-12 ENCOUNTER — Encounter: Attending: Physical Medicine & Rehabilitation | Primary: Internal Medicine

## 2019-03-12 NOTE — Telephone Encounter (Signed)
FAXED [facility] (EVERYTHING IN Epic) TO [facility] @ [phone]

## 2019-05-21 NOTE — Progress Notes (Signed)
Results for the following test have been finalized and entered into the patients chart.

## 2019-10-18 ENCOUNTER — Other Ambulatory Visit: Payer: Self-pay | Admitting: *Deleted

## 2019-10-18 ENCOUNTER — Other Ambulatory Visit: Payer: Self-pay | Admitting: Urology

## 2019-10-18 ENCOUNTER — Other Ambulatory Visit (HOSPITAL_COMMUNITY): Payer: Self-pay | Admitting: Urology

## 2019-10-18 DIAGNOSIS — C61 Malignant neoplasm of prostate: Secondary | ICD-10-CM

## 2019-10-18 DIAGNOSIS — M8580 Other specified disorders of bone density and structure, unspecified site: Secondary | ICD-10-CM

## 2019-11-11 ENCOUNTER — Encounter (HOSPITAL_COMMUNITY): Payer: Medicare Other

## 2019-11-11 ENCOUNTER — Encounter (HOSPITAL_COMMUNITY): Payer: Self-pay

## 2019-11-16 ENCOUNTER — Other Ambulatory Visit: Payer: Self-pay | Admitting: Urology

## 2019-11-16 ENCOUNTER — Other Ambulatory Visit (HOSPITAL_COMMUNITY): Payer: Self-pay | Admitting: Urology

## 2019-11-16 DIAGNOSIS — C61 Malignant neoplasm of prostate: Secondary | ICD-10-CM

## 2019-12-03 ENCOUNTER — Other Ambulatory Visit (HOSPITAL_COMMUNITY): Payer: Medicare Other

## 2019-12-03 ENCOUNTER — Encounter (HOSPITAL_COMMUNITY): Payer: Medicare Other

## 2019-12-03 ENCOUNTER — Ambulatory Visit (HOSPITAL_COMMUNITY): Admission: RE | Admit: 2019-12-03 | Payer: Medicare Other | Source: Ambulatory Visit

## 2019-12-07 ENCOUNTER — Other Ambulatory Visit: Payer: Self-pay | Admitting: Urology

## 2019-12-16 ENCOUNTER — Encounter (HOSPITAL_COMMUNITY): Payer: Self-pay

## 2019-12-16 ENCOUNTER — Encounter (HOSPITAL_COMMUNITY): Payer: Medicare Other

## 2019-12-16 ENCOUNTER — Other Ambulatory Visit (HOSPITAL_COMMUNITY): Payer: Medicare Other

## 2019-12-21 ENCOUNTER — Other Ambulatory Visit: Payer: Self-pay | Admitting: Urology

## 2019-12-21 DIAGNOSIS — C61 Malignant neoplasm of prostate: Secondary | ICD-10-CM

## 2019-12-21 DIAGNOSIS — M8580 Other specified disorders of bone density and structure, unspecified site: Secondary | ICD-10-CM

## 2019-12-27 ENCOUNTER — Ambulatory Visit
Admission: RE | Admit: 2019-12-27 | Discharge: 2019-12-27 | Disposition: A | Discharge status: Home / Self Care | Payer: Medicare Other | Source: Ambulatory Visit | Attending: Urology | Admitting: Urology

## 2019-12-27 ENCOUNTER — Other Ambulatory Visit: Payer: Self-pay

## 2019-12-27 DIAGNOSIS — C61 Malignant neoplasm of prostate: Secondary | ICD-10-CM

## 2019-12-27 DIAGNOSIS — M8580 Other specified disorders of bone density and structure, unspecified site: Secondary | ICD-10-CM

## 2020-08-14 ENCOUNTER — Emergency Department: Payer: Medicare Other

## 2020-08-14 ENCOUNTER — Emergency Department
Admission: EM | Admit: 2020-08-14 | Discharge: 2020-08-14 | Disposition: A | Discharge status: Home / Self Care | Payer: Medicare Other | Attending: Emergency Medicine | Admitting: Emergency Medicine

## 2020-08-14 ENCOUNTER — Other Ambulatory Visit: Payer: Self-pay

## 2020-08-14 DIAGNOSIS — K5900 Constipation, unspecified: Secondary | ICD-10-CM

## 2020-08-14 DIAGNOSIS — E119 Type 2 diabetes mellitus without complications: Secondary | ICD-10-CM | POA: Diagnosis not present

## 2020-08-14 DIAGNOSIS — I2510 Atherosclerotic heart disease of native coronary artery without angina pectoris: Secondary | ICD-10-CM | POA: Insufficient documentation

## 2020-08-14 DIAGNOSIS — I10 Essential (primary) hypertension: Secondary | ICD-10-CM | POA: Insufficient documentation

## 2020-08-14 DIAGNOSIS — Z79899 Other long term (current) drug therapy: Secondary | ICD-10-CM | POA: Diagnosis not present

## 2020-08-14 DIAGNOSIS — R109 Unspecified abdominal pain: Secondary | ICD-10-CM | POA: Insufficient documentation

## 2020-08-14 LAB — COMPREHENSIVE METABOLIC PANEL
ALT: 13 U/L (ref 0–44)
AST: 12 U/L — ABNORMAL LOW (ref 15–41)
Albumin: 4 g/dL (ref 3.5–5.0)
Alkaline Phosphatase: 43 U/L (ref 38–126)
Anion gap: 12 (ref 5–15)
BUN: 26 mg/dL — ABNORMAL HIGH (ref 8–23)
CO2: 28 mmol/L (ref 22–32)
Calcium: 9 mg/dL (ref 8.9–10.3)
Chloride: 99 mmol/L (ref 98–111)
Creatinine, Ser: 0.99 mg/dL (ref 0.61–1.24)
GFR calc Af Amer: >60 mL/min (ref >60)
GFR calc non Af Amer: >60 mL/min (ref >60)
Glucose, Bld: 185 mg/dL — ABNORMAL HIGH (ref 70–99)
Potassium: 4.4 mmol/L (ref 3.5–5.1)
Sodium: 139 mmol/L (ref 135–145)
Total Bilirubin: 1 mg/dL (ref 0.3–1.2)
Total Protein: 7 g/dL (ref 6.5–8.1)

## 2020-08-14 LAB — CBC
HCT: 36.7 % — ABNORMAL LOW (ref 39.0–52.0)
Hemoglobin: 11.9 g/dL — ABNORMAL LOW (ref 13.0–17.0)
MCH: 30.9 pg (ref 26.0–34.0)
MCHC: 32.4 g/dL (ref 30.0–36.0)
MCV: 95.3 fL (ref 80.0–100.0)
Platelets: 286 10*3/uL (ref 150–400)
RBC: 3.85 MIL/uL — ABNORMAL LOW (ref 4.22–5.81)
RDW: 13.5 % (ref 11.5–15.5)
WBC: 9.5 10*3/uL (ref 4.0–10.5)
nRBC: 0 % (ref 0.0–0.2)

## 2020-08-14 LAB — LIPASE, BLOOD: Lipase: 26 U/L (ref 11–51)

## 2020-08-14 MED ORDER — LINACLOTIDE 290 MCG PO CAPS: 290.0000 ug | ORAL_CAPSULE | Freq: Every day | ORAL | 0 refills | Status: DC

## 2020-08-14 MED ORDER — ONDANSETRON 8 MG PO TBDP
8.0000 mg | ORAL_TABLET | Freq: Once | ORAL | Status: AC
  Administered 2020-08-14: 8 mg via ORAL
  Filled 2020-08-14: qty 1

## 2020-08-14 MED ORDER — FLEET ENEMA 7-19 GM/118ML RE ENEM
1.0000 | ENEMA | Freq: Once | RECTAL | Status: AC
  Administered 2020-08-14: 1 via RECTAL

## 2020-08-14 NOTE — ED Provider Notes (Signed)
[facility] Emergency Department Provider Note ____________________________________________   First MD Initiated Contact with Patient 08/14/20 1218     (approximate)  I have reviewed the triage vital signs and the nursing notes.   HISTORY  Chief Complaint Abdominal Pain and Constipation    HPI Allen [name] is a 86 y.o. male with PMH of hypertension, hyperlipidemia, diabetes, CAD, and GERD, as well as remote history of pancreatitis status post abdominal surgeries, who presents with constipation over the last 3 to 4 days, persistent course, and associated with lower abdominal pain as well as nausea but no vomiting.  The patient states that he had a similar episode a few years ago and was seen at an outside hospital.  He was given medicine for the constipation which resolved it.  He denies any fever or chills, or any urinary symptoms.  He has not taken anything for it at home.  History reviewed. No pertinent past medical history.  There are no problems to display for this patient.   History reviewed. No pertinent surgical history.  Prior to Admission medications   Medication Sig Start Date End Date Taking? Authorizing Provider  linaclotide (LINZESS) 290 MCG CAPS capsule Take 1 capsule (290 mcg total) by mouth daily before breakfast for 15 days. 08/14/20 08/29/20  Arta Silence, MD    Allergies Patient has no allergy information on record.  No family history on file.  Social History Social History   Tobacco Use  . Smoking status: Not on file  Substance Use Topics  . Alcohol use: Not on file  . Drug use: Not on file    Review of Systems  Constitutional: No fever. Eyes: No redness. ENT: No sore throat. Cardiovascular: Denies chest pain. Respiratory: Denies shortness of breath. Gastrointestinal: Positive for nausea.  No vomiting or diarrhea. Genitourinary: Negative for dysuria.  Musculoskeletal: Negative for back pain. Skin: Negative  for rash. Neurological: Negative for headache.   ____________________________________________   PHYSICAL EXAM:  VITAL SIGNS: ED Triage Vitals  Enc Vitals Group     BP 08/14/20 0938 (!) 113/92     Pulse Rate 08/14/20 0938 85     Resp 08/14/20 0938 18     Temp 08/14/20 0938 98 F (36.7 C)     Temp src --      SpO2 08/14/20 0938 95 %     Weight 08/14/20 0939 180 lb (81.6 kg)     Height 08/14/20 0939 5\' 9"  (1.753 m)     Head Circumference --      Peak Flow --      Pain Score 08/14/20 0939 8     Pain Loc --      Pain Edu? --      Excl. in [place]? --     Constitutional: Alert and oriented.  Slightly uncomfortable appearing but in no acute distress. Eyes: Conjunctivae are normal.  No scleral icterus. Head: Atraumatic. Nose: No congestion/rhinnorhea. Mouth/Throat: Mucous membranes are moist.   Neck: Normal range of motion.  Cardiovascular: Normal rate, regular rhythm.  Good peripheral circulation. Respiratory: Normal respiratory effort.  No retractions.  Gastrointestinal: Soft with mild bilateral lower quadrant discomfort to palpation.  No focal tenderness or peritoneal signs.  No distention.  No palpable impacted stool on DRE. Genitourinary: No flank tenderness. Musculoskeletal: Extremities warm and well perfused.  Neurologic:  Normal speech and language. No gross focal neurologic deficits are appreciated.  Skin:  Skin is warm and dry. No rash noted. Psychiatric: Mood and affect  are normal. Speech and behavior are normal.  ____________________________________________   LABS (all labs ordered are listed, but only abnormal results are displayed)  Labs Reviewed  COMPREHENSIVE METABOLIC PANEL - Abnormal; Notable for the following components:      Result Value   Glucose, Bld 185 (*)    BUN 26 (*)    AST 12 (*)    All other components within normal limits  CBC - Abnormal; Notable for the following components:   RBC 3.85 (*)    Hemoglobin 11.9 (*)    HCT 36.7 (*)    All  other components within normal limits  LIPASE, BLOOD  URINALYSIS, COMPLETE (UACMP) WITH MICROSCOPIC   ____________________________________________  EKG   ____________________________________________  RADIOLOGY  XR abdomen: Nonobstructive bowel gas pattern.  ____________________________________________   PROCEDURES  Procedure(s) performed: No  Procedures  Critical Care performed: No ____________________________________________   INITIAL IMPRESSION / ASSESSMENT AND PLAN / ED COURSE  Pertinent labs & imaging results that were available during my care of the patient were reviewed by me and considered in my medical decision making (see chart for details).  86 year old male with PMH as noted above presents with constipation for the last 3 to 4 days associated with lower abdominal pain and nausea.  The patient reports he has had similar episodes in the past that resolved with medication.  I reviewed the past medical records in epic.  The patient has no prior ED visits or admissions here, and I am unable to see any record from a self-reported outside hospital visit a few years ago for a similar presentation.  On exam the patient is slightly uncomfortable but overall well-appearing.  His vital signs are normal.  He has mild discomfort to palpation of the bilateral lower quadrants, but no significant abdominal tenderness and no impacted stool on DRE.  Overall presentation is most consistent with constipation.  I have a low suspicion for SBO given the lack of abdominal distention, nausea or vomiting, as well as a similar episode in the past that was related to constipation.  Episode do not suspect colitis or diverticulitis given the lack of diarrhea, and vascular etiology is highly unlikely given the patient's overall well appearance, and reassuring vital signs and exam.  The initial lab work-up is unremarkable for acute findings.  We will give Zofran, a Fleet enema, obtain an x-ray of  the abdomen, and reassess.  ----------------------------------------- 2:25 PM on 08/14/2020 -----------------------------------------  X-ray showed stool with a nonobstructive bowel gas pattern.  The patient had a large bowel movement after the Fleet enema.  He states that the pain is completely resolved, and he appears very comfortable.  Overall presentation is thus consistent with constipation.  At this time, the patient is stable for discharge home.  He reports previously having been on Linzess and states that it worked well for him so I prescribed a 2-week course of his previous dose.  I gave him thorough return precautions and he expressed understanding.  ____________________________________________   FINAL CLINICAL IMPRESSION(S) / ED DIAGNOSES  Final diagnoses:  Constipation, unspecified constipation type      NEW MEDICATIONS STARTED DURING THIS VISIT:  New Prescriptions   LINACLOTIDE (LINZESS) 290 MCG CAPS CAPSULE    Take 1 capsule (290 mcg total) by mouth daily before breakfast for 15 days.     Note:  This document was prepared using Dragon voice recognition software and may include unintentional dictation errors.    Arta Silence, MD 08/14/20 1426

## 2020-08-14 NOTE — ED Triage Notes (Signed)
Pt comes via POV from home with c/o nausea, abdominal pain and constipation for 5 days.

## 2020-08-14 NOTE — ED Notes (Signed)
Pt had large BM. Pt providing self with peri care now.

## 2020-08-14 NOTE — ED Notes (Signed)
This RN to bedside to give meds; pt off unit at imaging.

## 2020-08-14 NOTE — Discharge Instructions (Signed)
Take the Linzess as prescribed for the next few weeks.  Follow-up with your primary care doctor within the next 1 to 2 weeks.  Return to the ER for new, worsening, or persistent severe constipation, abdominal pain, vomiting, weakness, or any other new or worsening symptoms that concern you.

## 2020-08-14 NOTE — ED Notes (Signed)
Pt up to bedside toilet. Educated not to strain; pt continues to strain.

## 2020-08-14 NOTE — ED Notes (Signed)
See triage note. This RN to complete enema soon.

## 2020-11-18 ENCOUNTER — Emergency Department: Payer: Medicare Other

## 2020-11-18 ENCOUNTER — Emergency Department
Admission: EM | Admit: 2020-11-18 | Discharge: 2020-11-18 | Disposition: A | Discharge status: Home / Self Care | Payer: Medicare Other | Attending: Emergency Medicine | Admitting: Emergency Medicine

## 2020-11-18 ENCOUNTER — Other Ambulatory Visit: Payer: Self-pay

## 2020-11-18 ENCOUNTER — Encounter: Payer: Self-pay | Admitting: Emergency Medicine

## 2020-11-18 DIAGNOSIS — Z20822 Contact with and (suspected) exposure to covid-19: Secondary | ICD-10-CM | POA: Diagnosis not present

## 2020-11-18 DIAGNOSIS — I5030 Unspecified diastolic (congestive) heart failure: Secondary | ICD-10-CM | POA: Insufficient documentation

## 2020-11-18 DIAGNOSIS — I2510 Atherosclerotic heart disease of native coronary artery without angina pectoris: Secondary | ICD-10-CM | POA: Insufficient documentation

## 2020-11-18 DIAGNOSIS — Z87891 Personal history of nicotine dependence: Secondary | ICD-10-CM | POA: Insufficient documentation

## 2020-11-18 DIAGNOSIS — R531 Weakness: Secondary | ICD-10-CM | POA: Insufficient documentation

## 2020-11-18 DIAGNOSIS — I110 Hypertensive heart disease with heart failure: Secondary | ICD-10-CM | POA: Diagnosis not present

## 2020-11-18 DIAGNOSIS — J9611 Chronic respiratory failure with hypoxia: Secondary | ICD-10-CM | POA: Insufficient documentation

## 2020-11-18 DIAGNOSIS — E119 Type 2 diabetes mellitus without complications: Secondary | ICD-10-CM | POA: Insufficient documentation

## 2020-11-18 HISTORY — DX: Gastro-esophageal reflux disease without esophagitis: K21.9

## 2020-11-18 HISTORY — DX: Hyperlipidemia, unspecified: E78.5

## 2020-11-18 HISTORY — DX: Essential (primary) hypertension: I10

## 2020-11-18 HISTORY — DX: Type 2 diabetes mellitus without complications: E11.9

## 2020-11-18 HISTORY — DX: Atherosclerotic heart disease of native coronary artery without angina pectoris: I25.10

## 2020-11-18 LAB — RESP PANEL BY RT-PCR (FLU A&B, COVID) ARPGX2
Influenza A by PCR: NEGATIVE
Influenza B by PCR: NEGATIVE
SARS Coronavirus 2 by RT PCR: NEGATIVE

## 2020-11-18 LAB — CBC
HCT: 33.7 % — ABNORMAL LOW (ref 39.0–52.0)
Hemoglobin: 10.9 g/dL — ABNORMAL LOW (ref 13.0–17.0)
MCH: 31.7 pg (ref 26.0–34.0)
MCHC: 32.3 g/dL (ref 30.0–36.0)
MCV: 98 fL (ref 80.0–100.0)
Platelets: 273 10*3/uL (ref 150–400)
RBC: 3.44 MIL/uL — ABNORMAL LOW (ref 4.22–5.81)
RDW: 13 % (ref 11.5–15.5)
WBC: 9.6 10*3/uL (ref 4.0–10.5)
nRBC: 0 % (ref 0.0–0.2)

## 2020-11-18 LAB — URINALYSIS, COMPLETE (UACMP) WITH MICROSCOPIC
Bacteria, UA: NONE SEEN
Bilirubin Urine: NEGATIVE
Glucose, UA: NEGATIVE mg/dL
Hgb urine dipstick: NEGATIVE
Ketones, ur: NEGATIVE mg/dL
Leukocytes,Ua: NEGATIVE
Nitrite: NEGATIVE
Protein, ur: NEGATIVE mg/dL
Specific Gravity, Urine: 1.014 (ref 1.005–1.030)
pH: 5 (ref 5.0–8.0)

## 2020-11-18 LAB — BASIC METABOLIC PANEL
Anion gap: 11 (ref 5–15)
BUN: 29 mg/dL — ABNORMAL HIGH (ref 8–23)
CO2: 32 mmol/L (ref 22–32)
Calcium: 9.3 mg/dL (ref 8.9–10.3)
Chloride: 96 mmol/L — ABNORMAL LOW (ref 98–111)
Creatinine, Ser: 0.94 mg/dL (ref 0.61–1.24)
GFR, Estimated: >60 mL/min (ref >60)
Glucose, Bld: 149 mg/dL — ABNORMAL HIGH (ref 70–99)
Potassium: 4.3 mmol/L (ref 3.5–5.1)
Sodium: 139 mmol/L (ref 135–145)

## 2020-11-18 LAB — CBG MONITORING, ED: Glucose-Capillary: 148 mg/dL — ABNORMAL HIGH (ref 70–99)

## 2020-11-18 LAB — BRAIN NATRIURETIC PEPTIDE: B Natriuretic Peptide: 135.1 pg/mL — ABNORMAL HIGH (ref 0.0–100.0)

## 2020-11-18 LAB — TROPONIN I (HIGH SENSITIVITY): Troponin I (High Sensitivity): 8 ng/L (ref <18)

## 2020-11-18 NOTE — ED Triage Notes (Signed)
Pt arrived via POV with son who states the patient has had a decline in health over the past week.  Son states today the patient told him that he was "ready to go"  Pt has had loss of appetite and increased weakness.  Pt wears 2L [address] normally at home, pt placed on 3 L [place] on arrival.

## 2020-11-18 NOTE — ED Provider Notes (Signed)
[facility] Emergency Department Provider Note   ____________________________________________   First MD Initiated Contact with Patient 11/18/20 1132     (approximate)  I have reviewed the triage vital signs and the nursing notes.   HISTORY  Chief Complaint Weakness    HPI Allen [name] is a 86 y.o. male with possible history of hypertension, hyperlipidemia, CAD status post CABG, diabetes, and diastolic CHF who presents to the ED complaining of generalized weakness and shortness of breath.  Patient son states that the patient has had a decline in his general condition over about the past week.  He has been spending more time in bed with decreased appetite and increased generalized weakness.  Patient also reports he feels like his breathing is worse than usual.  He describes feeling short of breath at rest, which is not abnormal for him but it is slightly worse than usual.  He has not had any fevers, cough, chest pain, leg swelling or pain.  He denies any dysuria or hematuria.  He typically wears 2 L nasal cannula chronically, which was started about 1 month ago.        Past Medical History:  Diagnosis Date  . Coronary artery disease   . Diabetes mellitus without complication ([place])   . GERD (gastroesophageal reflux disease)   . Hyperlipidemia   . Hypertension     There are no problems to display for this patient.   History reviewed. No pertinent surgical history.  Prior to Admission medications   Medication Sig Start Date End Date Taking? Authorizing Provider  linaclotide (LINZESS) 290 MCG CAPS capsule Take 1 capsule (290 mcg total) by mouth daily before breakfast for 15 days. 08/14/20 08/29/20  Arta Silence, MD    Allergies Patient has no known allergies.  No family history on file.  Social History Social History   Tobacco Use  . Smoking status: Former Research scientist (life sciences)  . Smokeless tobacco: Never Used  Vaping Use  . Vaping Use: Never used   Substance Use Topics  . Alcohol use: Not on file  . Drug use: Not on file    Review of Systems  Constitutional: No fever/chills.  Positive for generalized weakness. Eyes: No visual changes. ENT: No sore throat. Cardiovascular: Denies chest pain. Respiratory: Positive for shortness of breath. Gastrointestinal: No abdominal pain.  No nausea, no vomiting.  No diarrhea.  No constipation. Genitourinary: Negative for dysuria. Musculoskeletal: Negative for back pain. Skin: Negative for rash. Neurological: Negative for headaches, focal weakness or numbness.  ____________________________________________   PHYSICAL EXAM:  VITAL SIGNS: ED Triage Vitals  Enc Vitals Group     BP 11/18/20 1027 (!) 113/51     Pulse Rate 11/18/20 1027 80     Resp 11/18/20 1027 18     Temp 11/18/20 1027 97.7 F (36.5 C)     Temp Source 11/18/20 1027 Oral     SpO2 11/18/20 1027 100 %     Weight 11/18/20 1032 179 lb 14.3 oz (81.6 kg)     Height 11/18/20 1032 5\' 9"  (1.753 m)     Head Circumference --      Peak Flow --      Pain Score --      Pain Loc --      Pain Edu? --      Excl. in [place]? --     Constitutional: Alert and oriented. Eyes: Conjunctivae are normal. Head: Atraumatic. Nose: No congestion/rhinnorhea. Mouth/Throat: Mucous membranes are moist. Neck: Normal ROM Cardiovascular:  Normal rate, regular rhythm. Grossly normal heart sounds. Respiratory: Normal respiratory effort.  No retractions. Lungs CTAB. Gastrointestinal: Soft and nontender. No distention. Genitourinary: deferred Musculoskeletal: No lower extremity tenderness nor edema. Neurologic:  Normal speech and language. No gross focal neurologic deficits are appreciated. Skin:  Skin is warm, dry and intact. No rash noted. Psychiatric: Mood and affect are normal. Speech and behavior are normal.  ____________________________________________   LABS (all labs ordered are listed, but only abnormal results are displayed)  Labs  Reviewed  BASIC METABOLIC PANEL - Abnormal; Notable for the following components:      Result Value   Chloride 96 (*)    Glucose, Bld 149 (*)    BUN 29 (*)    All other components within normal limits  CBC - Abnormal; Notable for the following components:   RBC 3.44 (*)    Hemoglobin 10.9 (*)    HCT 33.7 (*)    All other components within normal limits  URINALYSIS, COMPLETE (UACMP) WITH MICROSCOPIC - Abnormal; Notable for the following components:   Color, Urine YELLOW (*)    APPearance CLEAR (*)    All other components within normal limits  BRAIN NATRIURETIC PEPTIDE - Abnormal; Notable for the following components:   B Natriuretic Peptide 135.1 (*)    All other components within normal limits  CBG MONITORING, ED - Abnormal; Notable for the following components:   Glucose-Capillary 148 (*)    All other components within normal limits  RESP PANEL BY RT-PCR (FLU A&B, COVID) [id number]  TROPONIN I (HIGH SENSITIVITY)   ____________________________________________  EKG  ED ECG REPORT I, Blake Divine, the attending physician, personally viewed and interpreted this ECG.   Date: 11/18/2020  EKG Time: 10:48  Rate: 86  Rhythm: normal sinus rhythm  Axis: LAD  Intervals:none  ST&T Change: None   PROCEDURES  Procedure(s) performed (including Critical Care):  Procedures   ____________________________________________   INITIAL IMPRESSION / ASSESSMENT AND PLAN / ED COURSE       86 year old male with past medical history of hypertension, hyperlipidemia, diabetes, CAD status post CABG, and diastolic CHF who presents to the ED complaining of increasing generalized weakness as well as difficulty breathing over the past week.  He denies any associated chest pain and EKG shows no evidence of arrhythmia or ischemia.  His lower extremities do not appear swollen, but we will further assess for CHF exacerbation with BNP and chest x-ray.  We will also add on troponin.  Lab work otherwise  reassuring at this time, UA not consistent with infection.  Troponin within normal limits and BNP very mildly elevated.  Chest x-ray reviewed by me and shows no edema consistent with CHF exacerbation.  There are mild bibasilar infiltrates per radiology, however these likely represent atelectasis as he has no leukocytosis, fever, or cough.  Much of patient's shortness of breath appears chronic and his O2 saturations are stable on his usual 2 L nasal cannula.  We will perform testing for COVID-19, but patient is requesting to be discharged home at this time.  His son will take him to follow-up with his PCP later this week and son was advised to have patient return to the ED for any worsening symptoms.  Patient and son agree with plan.      ____________________________________________   FINAL CLINICAL IMPRESSION(S) / ED DIAGNOSES  Final diagnoses:  Generalized weakness  Chronic respiratory failure with hypoxia [facility])     ED Discharge Orders    None  Note:  This document was prepared using Dragon voice recognition software and may include unintentional dictation errors.   Blake Divine, MD 11/18/20 1331

## 2020-12-11 ENCOUNTER — Ambulatory Visit (INDEPENDENT_AMBULATORY_CARE_PROVIDER_SITE_OTHER): Payer: Medicare Other | Admitting: Internal Medicine

## 2020-12-11 ENCOUNTER — Other Ambulatory Visit: Payer: Self-pay

## 2020-12-11 ENCOUNTER — Encounter: Payer: Self-pay | Admitting: Internal Medicine

## 2020-12-11 VITALS — BP 118/64 | HR 100 | Ht 69.0 in | Wt 169.4 lb

## 2020-12-11 DIAGNOSIS — J9611 Chronic respiratory failure with hypoxia: Secondary | ICD-10-CM

## 2020-12-11 DIAGNOSIS — R0602 Shortness of breath: Secondary | ICD-10-CM

## 2020-12-11 NOTE — Progress Notes (Signed)
Allen [name]    [id number]    1929-06-19  Primary Care Physician:Russo, Jenny Reichmann, MD  Referring Physician: Shon Baton, MD Reason for Consultation: shortness of breath Date of Consultation: 12/11/2020  Chief complaint:   Chief Complaint  Patient presents with  . Consult    Pt has been having problems with SOB which happens all the time. Pt does wear O2 at 3-4 liters which was first only at night but now pt is wearing O2 24/7. DME: [employer]     HPI: Allen [name] is a 86 y.o. man here with his son for referral of shortness of breath. Has CAD s/p CABG in 2012, spinal stenosis with chronic daily pain on fentanyl patch followed by pain management.    In May 2021 was put on oxygen nocturnally by former PCP Dr. Lindon Romp.   Was seen by Pulmonary Dr. Kathi Ludwig at [facility] and PFTs showed moderate restriction to ventilation without obstruction felt to be secondary to kyphosis.   Sob with minimal exertion. He takes lasix 20 mg once a day. Cannot increase this dose because he has prostate enlargement. Mobility is an issue. He doesn't like taking more lasix because of the frequency in urination.  Denies cough, chest tightness, wheezing. Has some aspiration when he eats. Appetite is decreasing.  Lives at home with his son and daughter in law.   Has been prescribed nebulizer treatments but had no benefit with breathing and had jitteriness.   Son explains overall that he has been going down hill the past two years, but definitely in the past month. The patient's biggest concern is his comfort and wants pain medication.   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  Smoking history: smoked form age 64 to 30, quit in 34s.   Social History   Occupational History  . Not on file  Tobacco Use  . Smoking status: Former Smoker    Packs/day: 1.00    Years: 12.00     Pack years: 12.00    Types: Cigarettes    Quit date: 1964    Years since quitting: 57.9  . Smokeless tobacco: Never Used  Vaping Use  . Vaping Use: Never used  Substance and Sexual Activity  . Alcohol use: Not on file  . Drug use: Not on file  . Sexual activity: Not on file    Relevant family history:  History reviewed. No pertinent family history.  Past Medical History:  Diagnosis Date  . Coronary artery disease   . Diabetes mellitus without complication ([place])   . GERD (gastroesophageal reflux disease)   . Hyperlipidemia   . Hypertension     History reviewed. No pertinent surgical history.   Physical Exam: Blood pressure 118/64, pulse 100, height 5\' 9"  (1.753 m), weight 169 lb 6.4 oz (76.8 kg), SpO2 92 %. Gen:      No acute distress, elderly in [place] ENT:  no nasal polyps, mucus membranes moist Lungs:    Kyphosis,  No increased respiratory effort, symmetric chest wall excursion, clear to auscultation bilaterally, no wheezes or crackles CV:         Regular rate and rhythm; no murmurs, rubs, or gallops.  Trace pedal edema Abd:      + bowel sounds; soft, non-tender; no distension MSK:  Osteoarthritis changes noted Skin:      Warm and  dry; no rashes Neuro: normal speech, HOH Psych: alert and oriented x3, normal mood and affect   Data Reviewed/Medical Decision Making:  Independent interpretation of tests: Imaging: . Review of patient's chest xray Nov 2021 images revealed cardiomegaly, low lung volumes. The patient's images have been independently reviewed by me.    PFTs: I have personally reviewed the patient's PFTs and they show moderately severe restriction to ventilation. Performed October 2020 at [place]:  Lab Results  Component Value Date   WBC 9.6 11/18/2020   HGB 10.9 (L) 11/18/2020   HCT 33.7 (L) 11/18/2020   MCV 98.0 11/18/2020   PLT 273 11/18/2020   Lab Results  Component Value Date   NA 139 11/18/2020   K 4.3 11/18/2020   CL 96  (L) 11/18/2020   CO2 32 11/18/2020     Immunization status:  Immunization History  Administered Date(s) Administered  . Influenza, High Dose Seasonal PF 08/30/2020  . PFIZER SARS-COV-2 Vaccination 01/12/2020, 02/02/2020, 09/25/2020    . I reviewed prior external note(s) from PCP Dr. Virgina Jock, Dr. Kathi Ludwig, Dr. Lamonte Sakai . I reviewed the result(s) of the labs and imaging as noted above.   Assessment:  Shortness of breath CAD s/p CABG Moderate restriction to ventilation  Spinal stenosis with chronic pain BPH Chronic hypoxemic respiratory failure  Plan/Recommendations: Mr. Miskell has multiple medical conditions that appear to be gradually progressing. I see no evidence of obstructive lung disease and he has not responded to inhaler therapies. His lung exam is reassuring for an etiology other than musculoskeletal chest wall restriction. His biggest concern is his chronic pain and his comfort. I discussed with him and his son that goals of care are important especially in the aging body and that if comfort is paramount it may be time to start thinking about outpatient hospice and palliative care. Recommend against aggressive measures and would encourage them to think about DNR.   We discussed disease management and progression at length today. I don't think any additional testing or mediations from a pulmonary perspective would serve his goals.   Return to Care: Return if symptoms worsen or fail to improve.  Lenice Llamas, MD Pulmonary and [place]  CC: No ref. provider found

## 2020-12-13 ENCOUNTER — Telehealth: Payer: Self-pay

## 2020-12-13 NOTE — Telephone Encounter (Signed)
Spoke with patient's son Rush Landmark and scheduled an in-person Palliative Consult for 01/21/2021 9 AM.  COVID screening was negative. 2 dogs in home, son will put away befor NP arrives. Patient lives with son and DIL.  Consent obtained; updated Outlook/Netsmart/Team List and Epic.

## 2020-12-27 ENCOUNTER — Other Ambulatory Visit: Payer: Self-pay

## 2020-12-27 ENCOUNTER — Emergency Department
Admission: EM | Admit: 2020-12-27 | Discharge: 2020-12-27 | Disposition: A | Discharge status: Home / Self Care | Payer: Medicare Other | Attending: Emergency Medicine | Admitting: Emergency Medicine

## 2020-12-27 ENCOUNTER — Encounter: Payer: Self-pay | Admitting: Emergency Medicine

## 2020-12-27 DIAGNOSIS — Z5321 Procedure and treatment not carried out due to patient leaving prior to being seen by health care provider: Secondary | ICD-10-CM | POA: Insufficient documentation

## 2020-12-27 DIAGNOSIS — R451 Restlessness and agitation: Secondary | ICD-10-CM | POA: Insufficient documentation

## 2020-12-27 DIAGNOSIS — F419 Anxiety disorder, unspecified: Secondary | ICD-10-CM | POA: Insufficient documentation

## 2020-12-27 DIAGNOSIS — R531 Weakness: Secondary | ICD-10-CM | POA: Diagnosis not present

## 2020-12-27 DIAGNOSIS — R630 Anorexia: Secondary | ICD-10-CM | POA: Diagnosis not present

## 2020-12-27 LAB — BASIC METABOLIC PANEL
Anion gap: 11 (ref 5–15)
BUN: 28 mg/dL — ABNORMAL HIGH (ref 8–23)
CO2: 35 mmol/L — ABNORMAL HIGH (ref 22–32)
Calcium: 9.1 mg/dL (ref 8.9–10.3)
Chloride: 94 mmol/L — ABNORMAL LOW (ref 98–111)
Creatinine, Ser: 1.17 mg/dL (ref 0.61–1.24)
GFR, Estimated: 60 mL/min — ABNORMAL LOW (ref >60)
Glucose, Bld: 185 mg/dL — ABNORMAL HIGH (ref 70–99)
Potassium: 4.4 mmol/L (ref 3.5–5.1)
Sodium: 140 mmol/L (ref 135–145)

## 2020-12-27 LAB — CBC
HCT: 33.8 % — ABNORMAL LOW (ref 39.0–52.0)
Hemoglobin: 10.9 g/dL — ABNORMAL LOW (ref 13.0–17.0)
MCH: 32.3 pg (ref 26.0–34.0)
MCHC: 32.2 g/dL (ref 30.0–36.0)
MCV: 100.3 fL — ABNORMAL HIGH (ref 80.0–100.0)
Platelets: 300 10*3/uL (ref 150–400)
RBC: 3.37 MIL/uL — ABNORMAL LOW (ref 4.22–5.81)
RDW: 13.2 % (ref 11.5–15.5)
WBC: 11.4 10*3/uL — ABNORMAL HIGH (ref 4.0–10.5)
nRBC: 0 % (ref 0.0–0.2)

## 2020-12-27 LAB — URINALYSIS, COMPLETE (UACMP) WITH MICROSCOPIC
Bacteria, UA: NONE SEEN
Bilirubin Urine: NEGATIVE
Glucose, UA: NEGATIVE mg/dL
Ketones, ur: NEGATIVE mg/dL
Leukocytes,Ua: NEGATIVE
Nitrite: NEGATIVE
Protein, ur: NEGATIVE mg/dL
RBC / HPF: >50 RBC/hpf — ABNORMAL HIGH (ref 0–5)
Specific Gravity, Urine: 1.02 (ref 1.005–1.030)
pH: 5 (ref 5.0–8.0)

## 2020-12-27 NOTE — ED Triage Notes (Addendum)
Pt comes into the ED via POV with his son c/o weakness.  Pt's family states that since the first week of December he has had increased weakness, confusion, tremors, balance is off,etc.  Pt has fallen 3 times this month since then.  Pt is currently in NAD with even and unlabored respirations. Pt's PCP told the family to come to the hospital to be evaluated.  Pt is oriented to self, place, and situation.  Pt denies any new pains outside of his normal chronic pain. Pt also has recently been put on anxiety medication (buspirone) and they have been increasing his dose.  Pt now has decreased appetite, increased anxieties, and restlessness.

## 2020-12-27 NOTE — ED Notes (Signed)
Pt family member reported that they were leaving and would call MD and let them know that they came to the ED and waited so long they left.

## 2021-01-05 ENCOUNTER — Emergency Department (HOSPITAL_BASED_OUTPATIENT_CLINIC_OR_DEPARTMENT_OTHER): Payer: Medicare Other

## 2021-01-05 ENCOUNTER — Encounter (HOSPITAL_BASED_OUTPATIENT_CLINIC_OR_DEPARTMENT_OTHER): Payer: Self-pay | Admitting: Emergency Medicine

## 2021-01-05 ENCOUNTER — Inpatient Hospital Stay (HOSPITAL_BASED_OUTPATIENT_CLINIC_OR_DEPARTMENT_OTHER)
Admission: EM | Admit: 2021-01-05 | Discharge: 2021-01-07 | DRG: 091 | Disposition: A | Discharge status: Home Health | Payer: Medicare Other | Attending: Internal Medicine | Admitting: Internal Medicine

## 2021-01-05 ENCOUNTER — Other Ambulatory Visit: Payer: Self-pay

## 2021-01-05 DIAGNOSIS — G8929 Other chronic pain: Secondary | ICD-10-CM

## 2021-01-05 DIAGNOSIS — R4182 Altered mental status, unspecified: Secondary | ICD-10-CM

## 2021-01-05 DIAGNOSIS — R400 Somnolence: Secondary | ICD-10-CM

## 2021-01-05 DIAGNOSIS — R7981 Abnormal blood-gas level: Secondary | ICD-10-CM

## 2021-01-05 DIAGNOSIS — M6281 Muscle weakness (generalized): Secondary | ICD-10-CM

## 2021-01-05 DIAGNOSIS — G928 Other toxic encephalopathy: Secondary | ICD-10-CM | POA: Diagnosis not present

## 2021-01-05 DIAGNOSIS — Z7984 Long term (current) use of oral hypoglycemic drugs: Secondary | ICD-10-CM

## 2021-01-05 DIAGNOSIS — Z20822 Contact with and (suspected) exposure to covid-19: Secondary | ICD-10-CM | POA: Diagnosis present

## 2021-01-05 DIAGNOSIS — K219 Gastro-esophageal reflux disease without esophagitis: Secondary | ICD-10-CM | POA: Diagnosis present

## 2021-01-05 DIAGNOSIS — I2510 Atherosclerotic heart disease of native coronary artery without angina pectoris: Secondary | ICD-10-CM | POA: Diagnosis present

## 2021-01-05 DIAGNOSIS — Z79899 Other long term (current) drug therapy: Secondary | ICD-10-CM

## 2021-01-05 DIAGNOSIS — G9341 Metabolic encephalopathy: Secondary | ICD-10-CM | POA: Diagnosis present

## 2021-01-05 DIAGNOSIS — J9621 Acute and chronic respiratory failure with hypoxia: Secondary | ICD-10-CM | POA: Insufficient documentation

## 2021-01-05 DIAGNOSIS — Z87891 Personal history of nicotine dependence: Secondary | ICD-10-CM

## 2021-01-05 DIAGNOSIS — M1990 Unspecified osteoarthritis, unspecified site: Secondary | ICD-10-CM | POA: Diagnosis present

## 2021-01-05 DIAGNOSIS — Z7982 Long term (current) use of aspirin: Secondary | ICD-10-CM

## 2021-01-05 DIAGNOSIS — J9622 Acute and chronic respiratory failure with hypercapnia: Secondary | ICD-10-CM | POA: Diagnosis present

## 2021-01-05 DIAGNOSIS — Z951 Presence of aortocoronary bypass graft: Secondary | ICD-10-CM

## 2021-01-05 DIAGNOSIS — E785 Hyperlipidemia, unspecified: Secondary | ICD-10-CM | POA: Diagnosis present

## 2021-01-05 DIAGNOSIS — T40415A Adverse effect of fentanyl or fentanyl analogs, initial encounter: Secondary | ICD-10-CM | POA: Diagnosis present

## 2021-01-05 DIAGNOSIS — T391X5A Adverse effect of 4-Aminophenol derivatives, initial encounter: Secondary | ICD-10-CM | POA: Diagnosis present

## 2021-01-05 DIAGNOSIS — E119 Type 2 diabetes mellitus without complications: Secondary | ICD-10-CM

## 2021-01-05 DIAGNOSIS — I10 Essential (primary) hypertension: Secondary | ICD-10-CM

## 2021-01-05 DIAGNOSIS — W19XXXA Unspecified fall, initial encounter: Secondary | ICD-10-CM

## 2021-01-05 DIAGNOSIS — T426X5A Adverse effect of other antiepileptic and sedative-hypnotic drugs, initial encounter: Secondary | ICD-10-CM | POA: Diagnosis present

## 2021-01-05 DIAGNOSIS — R0601 Orthopnea: Secondary | ICD-10-CM

## 2021-01-05 DIAGNOSIS — Z79891 Long term (current) use of opiate analgesic: Secondary | ICD-10-CM

## 2021-01-05 DIAGNOSIS — N400 Enlarged prostate without lower urinary tract symptoms: Secondary | ICD-10-CM | POA: Diagnosis present

## 2021-01-05 HISTORY — DX: Unspecified osteoarthritis, unspecified site: M19.90

## 2021-01-05 LAB — CBC WITH DIFFERENTIAL/PLATELET
Abs Immature Granulocytes: 0.06 10*3/uL (ref 0.00–0.07)
Basophils Absolute: 0 10*3/uL (ref 0.0–0.1)
Basophils Relative: 0 %
Eosinophils Absolute: 0 10*3/uL (ref 0.0–0.5)
Eosinophils Relative: 0 %
HCT: 35.2 % — ABNORMAL LOW (ref 39.0–52.0)
Hemoglobin: 11.7 g/dL — ABNORMAL LOW (ref 13.0–17.0)
Immature Granulocytes: 1 %
Lymphocytes Relative: 9 %
Lymphs Abs: 0.9 10*3/uL (ref 0.7–4.0)
MCH: 32.6 pg (ref 26.0–34.0)
MCHC: 33.2 g/dL (ref 30.0–36.0)
MCV: 98.1 fL (ref 80.0–100.0)
Monocytes Absolute: 0.6 10*3/uL (ref 0.1–1.0)
Monocytes Relative: 6 %
Neutro Abs: 8.9 10*3/uL — ABNORMAL HIGH (ref 1.7–7.7)
Neutrophils Relative %: 84 %
Platelets: 340 10*3/uL (ref 150–400)
RBC: 3.59 MIL/uL — ABNORMAL LOW (ref 4.22–5.81)
RDW: 13.2 % (ref 11.5–15.5)
WBC: 10.5 10*3/uL (ref 4.0–10.5)
nRBC: 0 % (ref 0.0–0.2)

## 2021-01-05 LAB — TSH: TSH: 0.706 u[IU]/mL (ref 0.350–4.500)

## 2021-01-05 LAB — URINALYSIS, ROUTINE W REFLEX MICROSCOPIC
Bilirubin Urine: NEGATIVE
Glucose, UA: NEGATIVE mg/dL
Ketones, ur: NEGATIVE mg/dL
Leukocytes,Ua: NEGATIVE
Nitrite: NEGATIVE
Protein, ur: NEGATIVE mg/dL
Specific Gravity, Urine: 1.025 (ref 1.005–1.030)
pH: 5 (ref 5.0–8.0)

## 2021-01-05 LAB — I-STAT ARTERIAL BLOOD GAS, ED
Acid-Base Excess: 13 mmol/L — ABNORMAL HIGH (ref 0.0–2.0)
Acid-Base Excess: 16 mmol/L — ABNORMAL HIGH (ref 0.0–2.0)
Bicarbonate: 41.8 mmol/L — ABNORMAL HIGH (ref 20.0–28.0)
Bicarbonate: 43.3 mmol/L — ABNORMAL HIGH (ref 20.0–28.0)
Calcium, Ion: 1.21 mmol/L (ref 1.15–1.40)
Calcium, Ion: 1.17 mmol/L (ref 1.15–1.40)
HCT: 33 % — ABNORMAL LOW (ref 39.0–52.0)
HCT: 32 % — ABNORMAL LOW (ref 39.0–52.0)
Hemoglobin: 11.2 g/dL — ABNORMAL LOW (ref 13.0–17.0)
Hemoglobin: 10.9 g/dL — ABNORMAL LOW (ref 13.0–17.0)
O2 Saturation: 97 %
O2 Saturation: 97 %
Patient temperature: 97.9
Patient temperature: 97.9
Potassium: 3.6 mmol/L (ref 3.5–5.1)
Potassium: 3.5 mmol/L (ref 3.5–5.1)
Sodium: 139 mmol/L (ref 135–145)
Sodium: 139 mmol/L (ref 135–145)
TCO2: 44 mmol/L — ABNORMAL HIGH (ref 22–32)
TCO2: 45 mmol/L — ABNORMAL HIGH (ref 22–32)
pCO2 arterial: 74.9 mmHg (ref 32.0–48.0)
pCO2 arterial: 66.2 mmHg (ref 32.0–48.0)
pH, Arterial: 7.353 (ref 7.350–7.450)
pH, Arterial: 7.422 (ref 7.350–7.450)
pO2, Arterial: 104 mmHg (ref 83.0–108.0)
pO2, Arterial: 92 mmHg (ref 83.0–108.0)

## 2021-01-05 LAB — COMPREHENSIVE METABOLIC PANEL
ALT: 16 U/L (ref 0–44)
AST: 17 U/L (ref 15–41)
Albumin: 4.2 g/dL (ref 3.5–5.0)
Alkaline Phosphatase: 40 U/L (ref 38–126)
Anion gap: 13 (ref 5–15)
BUN: 22 mg/dL (ref 8–23)
CO2: 37 mmol/L — ABNORMAL HIGH (ref 22–32)
Calcium: 9.2 mg/dL (ref 8.9–10.3)
Chloride: 91 mmol/L — ABNORMAL LOW (ref 98–111)
Creatinine, Ser: 0.87 mg/dL (ref 0.61–1.24)
GFR, Estimated: >60 mL/min (ref >60)
Glucose, Bld: 185 mg/dL — ABNORMAL HIGH (ref 70–99)
Potassium: 3.5 mmol/L (ref 3.5–5.1)
Sodium: 141 mmol/L (ref 135–145)
Total Bilirubin: 0.5 mg/dL (ref 0.3–1.2)
Total Protein: 7.1 g/dL (ref 6.5–8.1)

## 2021-01-05 LAB — RESP PANEL BY RT-PCR (FLU A&B, COVID) ARPGX2
Influenza A by PCR: NEGATIVE
Influenza B by PCR: NEGATIVE
SARS Coronavirus 2 by RT PCR: NEGATIVE

## 2021-01-05 LAB — AMMONIA: Ammonia: 12 umol/L (ref 9–35)

## 2021-01-05 LAB — LACTIC ACID, PLASMA
Lactic Acid, Venous: 2.4 mmol/L (ref 0.5–1.9)
Lactic Acid, Venous: 1.1 mmol/L (ref 0.5–1.9)

## 2021-01-05 LAB — URINALYSIS, MICROSCOPIC (REFLEX)

## 2021-01-05 LAB — LIPASE, BLOOD: Lipase: 27 U/L (ref 11–51)

## 2021-01-05 LAB — BRAIN NATRIURETIC PEPTIDE: B Natriuretic Peptide: 139 pg/mL — ABNORMAL HIGH (ref 0.0–100.0)

## 2021-01-05 NOTE — ED Notes (Signed)
Patient is becoming more lethargic. MD has ordered covid swab, and may order BiPAP pending results. RT to monitor.

## 2021-01-05 NOTE — ED Triage Notes (Signed)
Per family has had confusion, weakness since after thanksgiving.  Per son he fell during the night hitting the back of his head.  Per son was seen by ems and told there was an 8 hour wait in all of the Ed's so he should wait until today.

## 2021-01-05 NOTE — ED Provider Notes (Signed)
[facility] Provider Note   CSN: [id number] Arrival date & time: 01/05/21  1114     History Chief Complaint  Patient presents with  . [place] [name] is a 87 y.o. male.  The history is provided by the patient, a relative and medical records. No language interpreter was used.  Altered Mental Status Presenting symptoms: behavior changes, confusion and partial responsiveness   Severity:  Severe Most recent episode:  2 days ago Episode history:  Continuous Timing:  Constant Progression:  Waxing and waning Chronicity:  New Context: not dementia   Associated symptoms: no abdominal pain, no agitation, no fever, no headaches, no light-headedness, no nausea, no palpitations, no rash, no seizures, no vomiting and no weakness        Past Medical History:  Diagnosis Date  . Coronary artery disease   . Diabetes mellitus without complication ([place])   . GERD (gastroesophageal reflux disease)   . Hyperlipidemia   . Hypertension     There are no problems to display for this patient.   History reviewed. No pertinent surgical history.     No family history on file.  Social History   Tobacco Use  . Smoking status: Former Smoker    Packs/day: 1.00    Years: 12.00    Pack years: 12.00    Types: Cigarettes    Quit date: 1964    Years since quitting: 58.0  . Smokeless tobacco: Never Used  Vaping Use  . Vaping Use: Never used  Substance Use Topics  . Alcohol use: Never  . Drug use: Never    Home Medications Prior to Admission medications   Medication Sig Start Date End Date Taking? Authorizing Provider  aspirin 81 MG EC tablet Take by mouth. 01/04/19   [provider]  busPIRone (BUSPAR) 5 MG tablet Take 5 mg by mouth 2 (two) times daily. 12/10/20   [provider]  Calcium Polycarbophil (FIBER) 625 MG TABS TAKE 625 MILLIGRAMS ORAL DAILY 01/04/19   [provider]  clopidogrel (PLAVIX) 75 MG tablet Take 1  tablet by mouth daily. 07/21/20   [provider]  cyanocobalamin 1000 MCG tablet Take by mouth. 01/04/19   [provider]  escitalopram (LEXAPRO) 20 MG tablet Take 1 tablet by mouth daily. 01/12/19   [provider]  esomeprazole (NEXIUM) 40 MG capsule Take by mouth. 01/04/19   [provider]  famotidine (PEPCID) 20 MG tablet Take by mouth. 01/04/19   [provider]  fentaNYL ([place]) 25 MCG/HR SMARTSIG:Topical 11/13/20   [provider]  furosemide (LASIX) 20 MG tablet Take 1 tablet by mouth daily. 08/25/20   [provider]  HYDROcodone-acetaminophen (NORCO) 7.5-325 MG tablet Take by mouth. 02/03/19   [provider]  linaclotide (LINZESS) 290 MCG CAPS capsule Take 1 capsule (290 mcg total) by mouth daily before breakfast for 15 days. 08/14/20 08/29/20  Arta Silence, MD  linaclotide (LINZESS) 290 MCG CAPS capsule Take 290 mcg by mouth daily before breakfast.    [provider]  lisinopril (ZESTRIL) 5 MG tablet Take by mouth. 03/21/20   [provider]  loratadine (CLARITIN) 10 MG tablet TAKE 10 MILLIGRAMS ORAL DAILY 01/04/19   [provider]  magnesium oxide (MAG-OX) 400 MG tablet Take by mouth. 08/30/19   [provider]  metFORMIN (GLUCOPHAGE) 500 MG tablet 1000 mcg PO BID 01/14/19   [provider]  metoprolol succinate (TOPROL-XL) 25 MG 24 hr tablet Take  1 tablet by mouth daily. 09/29/20   [provider]  naloxone (NARCAN) 4 MG/0.1ML LIQD nasal spray kit CALL 911. ADMINISTER A SINGLE SPRAY OF NARCAN IN ONE NOSTRIL. REPEAT EVERY 3 MINUTES AS NEEDED IF NO OR MINIMAL RESPONSE. 06/13/20   [provider]  pioglitazone (ACTOS) 15 MG tablet Take by mouth. 01/06/20   [provider]  pregabalin (LYRICA) 50 MG capsule TAKE 50 MILLIGRAMS ORAL TWICE A DAY 01/04/19   [provider]  sitaGLIPtin (JANUVIA) 50 MG tablet Take by mouth. 07/08/19   [provider]  tamsulosin (FLOMAX) 0.4 MG CAPS capsule TAKE 0.4 MILLIGRAMS ORAL TWICE A DAY 01/04/19   [provider]  tiZANidine (ZANAFLEX) 2 MG tablet Take by mouth. 12/18/18   [provider]    Allergies    Patient has no known allergies.  Review of Systems   Review of Systems  Constitutional: Positive for fatigue. Negative for chills, diaphoresis and fever.  HENT: Negative for congestion.   Eyes: Negative for visual disturbance.  Respiratory: Negative for cough, chest tightness, shortness of breath and wheezing.   Cardiovascular: Negative for chest pain, palpitations and leg swelling.  Gastrointestinal: Negative for abdominal pain, constipation, diarrhea, nausea and vomiting.  Genitourinary: Positive for frequency. Negative for decreased urine volume and flank pain.  Musculoskeletal: Negative for back pain, neck pain and neck stiffness.  Skin: Negative for rash and wound.  Neurological: Negative for dizziness, seizures, speech difficulty, weakness, light-headedness, numbness and headaches.  Psychiatric/Behavioral: Positive for confusion. Negative for agitation.  All other systems reviewed and are negative.   Physical Exam Updated Vital Signs BP (!) 168/95   Pulse 86   Temp 97.9 F (36.6 C) (Oral)   Resp (!) 30   Ht '5\' 9"'  (1.753 m)   Wt 77.1 kg   SpO2 100%   BMI 25.10 kg/m   Physical Exam Vitals and nursing note reviewed.  Constitutional:      General: He is not in acute distress.    Appearance: He is well-developed and well-nourished. He is ill-appearing. He is not toxic-appearing or diaphoretic.  HENT:     Head: Normocephalic and atraumatic.     Nose: Nose normal. No congestion or rhinorrhea.     Mouth/Throat:     Mouth: Mucous membranes are moist.     Pharynx: No oropharyngeal exudate or posterior oropharyngeal erythema.  Eyes:     Extraocular Movements: Extraocular movements intact.     Conjunctiva/sclera: Conjunctivae normal.     Pupils:  Pupils are equal, round, and reactive to light.  Cardiovascular:     Rate and Rhythm: Normal rate and regular rhythm.     Pulses: Normal pulses.     Heart sounds: No murmur heard.   Pulmonary:     Effort: No respiratory distress.     Breath sounds: Decreased air movement present. Rhonchi and rales present. No wheezing.  Chest:     Chest wall: No tenderness.  Abdominal:     General: Abdomen is flat.     Palpations: Abdomen is soft.     Tenderness: There is no abdominal tenderness. There is no right CVA tenderness, left CVA tenderness, guarding or rebound.  Musculoskeletal:        General: No tenderness.     Cervical back: Neck supple. No tenderness.     Right lower leg: Edema present.     Left lower leg: Edema present.  Skin:    General: Skin is warm and dry.  Capillary Refill: Capillary refill takes less than 2 seconds.     Findings: No erythema.  Neurological:     General: No focal deficit present.     Mental Status: He is confused.     Cranial Nerves: No dysarthria or facial asymmetry.     Sensory: No sensory deficit.     Motor: No weakness, abnormal muscle tone or seizure activity.     Coordination: Coordination normal. Finger-Nose-Finger Test normal.     Comments: Patient is sleepy and somnolent arousable to voice.  Psychiatric:        Mood and Affect: Mood and affect normal.     ED Results / Procedures / Treatments   Labs (all labs ordered are listed, but only abnormal results are displayed) Labs Reviewed  CBC WITH DIFFERENTIAL/PLATELET - Abnormal; Notable for the following components:      Result Value   RBC 3.59 (*)    Hemoglobin 11.7 (*)    HCT 35.2 (*)    Neutro Abs 8.9 (*)    All other components within normal limits  COMPREHENSIVE METABOLIC PANEL - Abnormal; Notable for the following components:   Chloride 91 (*)    CO2 37 (*)    Glucose, Bld 185 (*)    All other components within normal limits  BRAIN NATRIURETIC PEPTIDE - Abnormal; Notable for the  following components:   B Natriuretic Peptide 139.0 (*)    All other components within normal limits  LACTIC ACID, PLASMA - Abnormal; Notable for the following components:   Lactic Acid, Venous 2.4 (*)    All other components within normal limits  URINALYSIS, ROUTINE W REFLEX MICROSCOPIC - Abnormal; Notable for the following components:   Hgb urine dipstick SMALL (*)    All other components within normal limits  URINALYSIS, MICROSCOPIC (REFLEX) - Abnormal; Notable for the following components:   Bacteria, UA MANY (*)    All other components within normal limits  I-STAT ARTERIAL BLOOD GAS, ED - Abnormal; Notable for the following components:   pCO2 arterial 74.9 (*)    Bicarbonate 41.8 (*)    TCO2 44 (*)    Acid-Base Excess 13.0 (*)    HCT 33.0 (*)    Hemoglobin 11.2 (*)    All other components within normal limits  URINE CULTURE  CULTURE, BLOOD (ROUTINE X 2)  CULTURE, BLOOD (ROUTINE X 2)  RESP PANEL BY RT-PCR (FLU A&B, COVID) ARPGX2  AMMONIA  LACTIC ACID, PLASMA  LIPASE, BLOOD  BLOOD GAS, ARTERIAL  TSH    EKG None  Radiology CT Head Wo Contrast  Result Date: 01/05/2021 CLINICAL DATA:  Head trauma. Confusion. Head trauma November time point EXAM: CT HEAD WITHOUT CONTRAST TECHNIQUE: Contiguous axial images were obtained from the base of the skull through the vertex without intravenous contrast. COMPARISON:  None. FINDINGS: Brain: No acute intracranial hemorrhage. No focal mass lesion. No CT evidence of acute infarction. No midline shift or mass effect. No hydrocephalus. Basilar cisterns are patent. There are periventricular and subcortical white matter hypodensities. Generalized cortical atrophy. Vascular: No hyperdense vessel or unexpected calcification. Skull: Normal. Negative for fracture or focal lesion. Sinuses/Orbits: Chronic opacification of the RIGHT maxillary sinus with sinus wall thickening. Orbits are normal. Other: None. IMPRESSION: 1. No acute intracranial findings. 2.  Atrophy and white matter microvascular disease. 3. Chronic RIGHT maxillary sinusitis. Electronically Signed   By: Suzy Bouchard M.D.   On: 01/05/2021 12:14   DG Chest Portable 1 View  Result Date: 01/05/2021 CLINICAL DATA:  Per family has had confusion, weakness since after thanksgiving. Per son he fell during the night hitting the back of his head. Per son was seen by ems and told there was an 8 hour wait in all of the Ed's so he should wait until today. Head CT today HX of htn and diabetes EXAM: PORTABLE CHEST 1 VIEW COMPARISON:  11/18/2020 FINDINGS: Stable changes from prior cardiac surgery. Cardiac silhouette top-normal in size. No mediastinal or hilar masses. Mild lung base opacities consistent with scarring or atelectasis, accentuated by low lung volumes, similar to the prior study. No convincing pneumonia and no evidence of pulmonary edema. No pleural effusion or pneumothorax. Skeletal structures are grossly intact. IMPRESSION: No acute cardiopulmonary disease. Electronically Signed   By: Lajean Manes M.D.   On: 01/05/2021 13:43    Procedures Procedures (including critical care time)  Medications Ordered in ED Medications - No data to display  ED Course  I have reviewed the triage vital signs and the nursing notes.  Pertinent labs & imaging results that were available during my care of the patient were reviewed by me and considered in my medical decision making (see chart for details).    MDM Rules/Calculators/A&P                          [name] is a 87 y.o. male with a past medical history significant for hypertension, hyperlipidemia, CAD status post CABG, diabetes, GERD who presents with a fall.  According to family, patient has been having gradually worsening general weakness and confusion since Thanksgiving and they are concerned he may have had a stroke at some point.  They say that over the last few days, patient has acute worsening and has been more confused.  They  report that he had a fall overnight.  They called EMS who came to see him but due to reported weights and emergency departments, they did not get him evaluated.  Therefore he does take Plavix and was having some headache.  According to family, patient has been acutely worsening with his mental status over the last week and more so in the last 36 hours.  Patient is normally talkative and with it but now is somnolent when not being stimulated and spoken to.  Patient has been having more shallow breathing as well.  Patient is not complaining of chest pain or shortness of breath and he normally is on 3 to 4 L nasal cannula.  He is not complaining of abdominal pain, leg pain, leg swelling worse than baseline, nausea, vomiting, or bowel changes.  He does have chronic frequency as he takes Flomax with his BPH.  Family is concerned about his falls today, acute mental status changes over the last few days, and persistent somnolence.  On exam, patient is somnolent.  With loud voice he will wake up and answer questions but he is feeling confused.  His lungs had some coarseness and crackles bilaterally.  Chest and abdomen were nontender.  Bowel sounds were appreciated.  Legs were slightly edematous but nontender.  Good pulses in extremities.  No focal neurologic deficits on initial exam.  He has a contusion and abrasion to the back of his head but no laceration.  Also has bruising on his left hand and his right elbow.  There is no bleeding seen.  He did not have tenderness on his snuffbox on the left side.  Intact grip strength and sensation and cap refill distally.  Clinically I am concerned about 2 separate problems.  1 is the fall only reported Plavix and a head injury.  We will get a CT head to rule out bleeding or fracture.  Also concerned about his mental status change worsening over the last few days and the somnolence today which family says is completely new compared to several days ago.  Patient's work-up  returned and I am concerned that although it is compensated, patient is having hypercarbic altered mental status.  His CO2 was elevated at 74.9 although his pH is only 7.35.  As they report he has had some vague mental status changes since November at Thanksgiving, I wonder if he has been having increasing CO2 since then and it reached a critical point over the last few days.  His ammonia is not elevated and his BNP is similar to prior.  His chest x-ray does not show pneumonia.  CT head did not show bleed or fracture.  Urinalysis does not show UTI with no nitrites or leukocytes but culture was sent.  Lactic acid slightly elevated, will trend.  Creatinine similar to prior and no elevation of liver function.  CBC similar to prior and reassuring.  I went and reassessed the patient and he was completely somnolent during my conversation with the family.  He is having very shallow breathing, I am concerned he is retaining CO2 causing his altered mental status.  As the report last week he was able to safely ambulate by himself, now he is falling and is getting agitated and confused.  I do not feel he is safe for discharge home.  I do feel needs admission for acute worsening somnolence and altered mental status as well as for trending his lactic, PT/OT, and to see if his elevated CO2 is contributing to altered mental status.  Family reports that they use CPAP at night but patient has never needed this.  We will do a COVID test and if negative, will start him on BiPAP to try to help this.  Medicine team will be called for admission and they would prefer he goes to [facility].  Final Clinical Impression(s) / ED Diagnoses Final diagnoses:  Fall, initial encounter  Somnolence  Altered mental status, unspecified altered mental status type  Elevated CO2 level     Clinical Impression: 1. Fall, initial encounter   2. Somnolence   3. Altered mental status, unspecified altered mental status type   4. Elevated CO2  level     Disposition: Admit  This note was prepared with assistance of Dragon voice recognition software. Occasional wrong-word or sound-a-like substitutions may have occurred due to the inherent limitations of voice recognition software.      Faatimah Spielberg, Gwenyth Allegra, MD 01/05/21 [phone]

## 2021-01-05 NOTE — ED Notes (Signed)
Patient placed on BiPAP at this time. 16/8 and 30%. Seems to be comfortable. Will check ABG at 1815.

## 2021-01-05 NOTE — ED Notes (Signed)
Patient transported to CT 

## 2021-01-05 NOTE — ED Notes (Signed)
ABG results given to MD. CO2 is elevated, but pH is normal. Patient does not appear to be in any distress.

## 2021-01-06 ENCOUNTER — Encounter (HOSPITAL_COMMUNITY): Payer: Self-pay | Admitting: Family Medicine

## 2021-01-06 DIAGNOSIS — R400 Somnolence: Secondary | ICD-10-CM

## 2021-01-06 DIAGNOSIS — G928 Other toxic encephalopathy: Secondary | ICD-10-CM | POA: Diagnosis present

## 2021-01-06 DIAGNOSIS — I10 Essential (primary) hypertension: Secondary | ICD-10-CM | POA: Diagnosis present

## 2021-01-06 DIAGNOSIS — Z7984 Long term (current) use of oral hypoglycemic drugs: Secondary | ICD-10-CM | POA: Diagnosis not present

## 2021-01-06 DIAGNOSIS — G9341 Metabolic encephalopathy: Secondary | ICD-10-CM

## 2021-01-06 DIAGNOSIS — Z20822 Contact with and (suspected) exposure to covid-19: Secondary | ICD-10-CM | POA: Diagnosis present

## 2021-01-06 DIAGNOSIS — T391X5A Adverse effect of 4-Aminophenol derivatives, initial encounter: Secondary | ICD-10-CM | POA: Diagnosis present

## 2021-01-06 DIAGNOSIS — I2510 Atherosclerotic heart disease of native coronary artery without angina pectoris: Secondary | ICD-10-CM | POA: Diagnosis present

## 2021-01-06 DIAGNOSIS — T40415A Adverse effect of fentanyl or fentanyl analogs, initial encounter: Secondary | ICD-10-CM | POA: Diagnosis present

## 2021-01-06 DIAGNOSIS — N400 Enlarged prostate without lower urinary tract symptoms: Secondary | ICD-10-CM | POA: Diagnosis present

## 2021-01-06 DIAGNOSIS — K219 Gastro-esophageal reflux disease without esophagitis: Secondary | ICD-10-CM | POA: Diagnosis present

## 2021-01-06 DIAGNOSIS — J9622 Acute and chronic respiratory failure with hypercapnia: Secondary | ICD-10-CM

## 2021-01-06 DIAGNOSIS — G8929 Other chronic pain: Secondary | ICD-10-CM

## 2021-01-06 DIAGNOSIS — E785 Hyperlipidemia, unspecified: Secondary | ICD-10-CM | POA: Diagnosis present

## 2021-01-06 DIAGNOSIS — W19XXXA Unspecified fall, initial encounter: Secondary | ICD-10-CM

## 2021-01-06 DIAGNOSIS — Z87891 Personal history of nicotine dependence: Secondary | ICD-10-CM | POA: Diagnosis not present

## 2021-01-06 DIAGNOSIS — J9621 Acute and chronic respiratory failure with hypoxia: Secondary | ICD-10-CM | POA: Insufficient documentation

## 2021-01-06 DIAGNOSIS — E119 Type 2 diabetes mellitus without complications: Secondary | ICD-10-CM

## 2021-01-06 DIAGNOSIS — Z79899 Other long term (current) drug therapy: Secondary | ICD-10-CM | POA: Diagnosis not present

## 2021-01-06 DIAGNOSIS — Z79891 Long term (current) use of opiate analgesic: Secondary | ICD-10-CM | POA: Diagnosis not present

## 2021-01-06 DIAGNOSIS — R4182 Altered mental status, unspecified: Secondary | ICD-10-CM | POA: Diagnosis present

## 2021-01-06 DIAGNOSIS — T426X5A Adverse effect of other antiepileptic and sedative-hypnotic drugs, initial encounter: Secondary | ICD-10-CM | POA: Diagnosis present

## 2021-01-06 DIAGNOSIS — Z951 Presence of aortocoronary bypass graft: Secondary | ICD-10-CM | POA: Diagnosis not present

## 2021-01-06 DIAGNOSIS — M1990 Unspecified osteoarthritis, unspecified site: Secondary | ICD-10-CM | POA: Diagnosis present

## 2021-01-06 DIAGNOSIS — Z7982 Long term (current) use of aspirin: Secondary | ICD-10-CM | POA: Diagnosis not present

## 2021-01-06 LAB — BASIC METABOLIC PANEL
Anion gap: 14 (ref 5–15)
BUN: 23 mg/dL (ref 8–23)
CO2: 37 mmol/L — ABNORMAL HIGH (ref 22–32)
Calcium: 8.8 mg/dL — ABNORMAL LOW (ref 8.9–10.3)
Chloride: 92 mmol/L — ABNORMAL LOW (ref 98–111)
Creatinine, Ser: 0.83 mg/dL (ref 0.61–1.24)
GFR, Estimated: >60 mL/min (ref >60)
Glucose, Bld: 166 mg/dL — ABNORMAL HIGH (ref 70–99)
Potassium: 3.3 mmol/L — ABNORMAL LOW (ref 3.5–5.1)
Sodium: 143 mmol/L (ref 135–145)

## 2021-01-06 LAB — CBC
HCT: 33.9 % — ABNORMAL LOW (ref 39.0–52.0)
Hemoglobin: 11.1 g/dL — ABNORMAL LOW (ref 13.0–17.0)
MCH: 32.6 pg (ref 26.0–34.0)
MCHC: 32.7 g/dL (ref 30.0–36.0)
MCV: 99.7 fL (ref 80.0–100.0)
Platelets: 324 10*3/uL (ref 150–400)
RBC: 3.4 MIL/uL — ABNORMAL LOW (ref 4.22–5.81)
RDW: 13.4 % (ref 11.5–15.5)
WBC: 10 10*3/uL (ref 4.0–10.5)
nRBC: 0 % (ref 0.0–0.2)

## 2021-01-06 LAB — GLUCOSE, CAPILLARY
Glucose-Capillary: 149 mg/dL — ABNORMAL HIGH (ref 70–99)
Glucose-Capillary: 176 mg/dL — ABNORMAL HIGH (ref 70–99)
Glucose-Capillary: 287 mg/dL — ABNORMAL HIGH (ref 70–99)
Glucose-Capillary: 192 mg/dL — ABNORMAL HIGH (ref 70–99)
Glucose-Capillary: 169 mg/dL — ABNORMAL HIGH (ref 70–99)

## 2021-01-06 LAB — HEMOGLOBIN A1C
Hgb A1c MFr Bld: 6.8 % — ABNORMAL HIGH (ref 4.8–5.6)
Mean Plasma Glucose: 148.46 mg/dL

## 2021-01-06 LAB — MRSA PCR SCREENING: MRSA by PCR: NEGATIVE

## 2021-01-06 LAB — URINE CULTURE

## 2021-01-06 MED ORDER — SENNOSIDES-DOCUSATE SODIUM 8.6-50 MG PO TABS: 1.0000 | ORAL_TABLET | Freq: Every evening | ORAL | Status: DC | PRN

## 2021-01-06 MED ORDER — INSULIN ASPART 100 UNIT/ML ~~LOC~~ SOLN
0.0000 [IU] | Freq: Three times a day (TID) | SUBCUTANEOUS | Status: DC
  Administered 2021-01-06: 5 [IU] via SUBCUTANEOUS
  Administered 2021-01-06 – 2021-01-07 (×4): 2 [IU] via SUBCUTANEOUS

## 2021-01-06 MED ORDER — ENALAPRILAT 1.25 MG/ML IV SOLN
0.6250 mg | Freq: Once | INTRAVENOUS | Status: AC
  Administered 2021-01-06: 0.625 mg via INTRAVENOUS
  Filled 2021-01-06: qty 1

## 2021-01-06 MED ORDER — ACETAMINOPHEN 325 MG PO TABS: 650.0000 mg | ORAL_TABLET | Freq: Four times a day (QID) | ORAL | Status: DC | PRN

## 2021-01-06 MED ORDER — ORAL CARE MOUTH RINSE
15.0000 mL | Freq: Two times a day (BID) | OROMUCOSAL | Status: DC
  Administered 2021-01-06 – 2021-01-07 (×4): 15 mL via OROMUCOSAL

## 2021-01-06 MED ORDER — FUROSEMIDE 20 MG PO TABS
20.0000 mg | ORAL_TABLET | Freq: Every day | ORAL | Status: DC
  Administered 2021-01-07: 20 mg via ORAL
  Filled 2021-01-06 (×2): qty 1

## 2021-01-06 MED ORDER — TAMSULOSIN HCL 0.4 MG PO CAPS
0.4000 mg | ORAL_CAPSULE | Freq: Two times a day (BID) | ORAL | Status: DC
  Administered 2021-01-06 – 2021-01-07 (×2): 0.4 mg via ORAL
  Filled 2021-01-06 (×2): qty 1

## 2021-01-06 MED ORDER — METFORMIN HCL 500 MG PO TABS
1000.0000 mg | ORAL_TABLET | Freq: Two times a day (BID) | ORAL | Status: DC
  Administered 2021-01-06 – 2021-01-07 (×2): 1000 mg via ORAL
  Filled 2021-01-06 (×3): qty 2

## 2021-01-06 MED ORDER — HYDROCODONE-ACETAMINOPHEN 7.5-325 MG PO TABS
1.0000 | ORAL_TABLET | Freq: Four times a day (QID) | ORAL | Status: DC | PRN
  Administered 2021-01-06 (×2): 1 via ORAL
  Filled 2021-01-06 (×2): qty 1

## 2021-01-06 MED ORDER — LISINOPRIL 2.5 MG PO TABS
5.0000 mg | ORAL_TABLET | Freq: Every day | ORAL | Status: DC
  Administered 2021-01-06 – 2021-01-07 (×2): 5 mg via ORAL
  Filled 2021-01-06 (×2): qty 2

## 2021-01-06 MED ORDER — METOPROLOL SUCCINATE ER 25 MG PO TB24
25.0000 mg | ORAL_TABLET | Freq: Every day | ORAL | Status: DC
  Administered 2021-01-06 – 2021-01-07 (×2): 25 mg via ORAL
  Filled 2021-01-06 (×2): qty 1

## 2021-01-06 MED ORDER — ENOXAPARIN SODIUM 40 MG/0.4ML ~~LOC~~ SOLN
40.0000 mg | SUBCUTANEOUS | Status: DC
  Administered 2021-01-07: 40 mg via SUBCUTANEOUS
  Filled 2021-01-06 (×2): qty 0.4

## 2021-01-06 MED ORDER — LABETALOL HCL 5 MG/ML IV SOLN
10.0000 mg | INTRAVENOUS | Status: DC | PRN
  Administered 2021-01-06 (×3): 10 mg via INTRAVENOUS
  Filled 2021-01-06 (×3): qty 4

## 2021-01-06 MED ORDER — LACTATED RINGERS IV SOLN: INTRAVENOUS | Status: DC

## 2021-01-06 MED ORDER — CLOPIDOGREL BISULFATE 75 MG PO TABS
75.0000 mg | ORAL_TABLET | Freq: Every day | ORAL | Status: DC
  Administered 2021-01-06 – 2021-01-07 (×2): 75 mg via ORAL
  Filled 2021-01-06 (×2): qty 1

## 2021-01-06 MED ORDER — LINAGLIPTIN 5 MG PO TABS
5.0000 mg | ORAL_TABLET | Freq: Every day | ORAL | Status: DC
  Administered 2021-01-07: 5 mg via ORAL
  Filled 2021-01-06: qty 1

## 2021-01-06 MED ORDER — CHLORHEXIDINE GLUCONATE CLOTH 2 % EX PADS
6.0000 | MEDICATED_PAD | Freq: Every day | CUTANEOUS | Status: DC
  Administered 2021-01-06 – 2021-01-07 (×3): 6 via TOPICAL

## 2021-01-06 MED ORDER — ASPIRIN EC 81 MG PO TBEC
81.0000 mg | DELAYED_RELEASE_TABLET | Freq: Every day | ORAL | Status: DC
  Administered 2021-01-06 – 2021-01-07 (×2): 81 mg via ORAL
  Filled 2021-01-06 (×2): qty 1

## 2021-01-06 MED ORDER — ESCITALOPRAM OXALATE 20 MG PO TABS
20.0000 mg | ORAL_TABLET | Freq: Every day | ORAL | Status: DC
  Administered 2021-01-06 – 2021-01-07 (×2): 20 mg via ORAL
  Filled 2021-01-06 (×2): qty 1

## 2021-01-06 MED ORDER — INSULIN ASPART 100 UNIT/ML ~~LOC~~ SOLN: 0.0000 [IU] | Freq: Every day | SUBCUTANEOUS | Status: DC

## 2021-01-06 MED ORDER — TAMSULOSIN HCL 0.4 MG PO CAPS: 0.4000 mg | ORAL_CAPSULE | Freq: Every day | ORAL | Status: DC

## 2021-01-06 MED ORDER — PNEUMOCOCCAL VAC POLYVALENT 25 MCG/0.5ML IJ INJ
0.5000 mL | INJECTION | INTRAMUSCULAR | Status: DC
  Filled 2021-01-06: qty 0.5

## 2021-01-06 MED ORDER — ACETAMINOPHEN 650 MG RE SUPP: 650.0000 mg | Freq: Four times a day (QID) | RECTAL | Status: DC | PRN

## 2021-01-06 NOTE — Progress Notes (Signed)
PROGRESS NOTE    Allen [name]   [id number]  DOB: [birth year]  DOA: 01/05/2021     0  PCP: Allen Baton, MD  CC: decreased mentation   Hospital Course: Allen [name] is an 87 yo male with PMH chronic pain (on fentanyl patch, hydrocodone, and lyrica), HTN, HLD, DMII, CAD s/p CABG, arthritis, GERD, newly diagnosed restrictive lung disease with chronic hypoxia and started on O2 (3-4L at home) in Dec 2021. He has had an overall slow but progressive decline from his multiple underlying medical conditions since approximately November 2021.  He was also evaluated by pulmonology in December 2021 with recommendations for family to consider palliative care evaluation as the patient appears to be globally declining slowly.  Family is also aware of this and understands that the patient has progressively started eating less, become less active, and seems to be overall declining.  They have arranged for a palliative care evaluation this month.  His family manages his chronic pain medications and he was brought in for worsening confusion/mentation as well as increased somnolence. Work-up was significant for hypercarbic respiratory failure and he was placed on BiPAP with good response.  His hypercarbia corrected and his mentation improved.  He became more alert and oriented and returned back to normal state of health.   Interval History:  Seen this am, much more awake and alert; BiPAP was off.  He was able to carry on conversation, answer questions, and follow commands.  Mentation had improved back to normal baseline.  Daughter-in-law was also present later in the morning and was updated with questions answered.  Old records reviewed in assessment of this patient  ROS: Constitutional: negative for chills and fevers, Respiratory: negative for cough, Cardiovascular: negative for chest pain and Gastrointestinal: negative for abdominal pain  Assessment & Plan: * Acute metabolic  encephalopathy -Multifactorial but most likely contributed by accumulation of multiple sedating agents notably his fentanyl, Lyrica, Norco.  He is also 87 years old and has been progressively declining at home.  Furthermore, oxygen may also be too much at home, SPO2 goal greater than 88% only which has been achieved with only 2 L oxygen currently inpatient (will likely reduce his home O2 requirement prior to discharge) -Continue monitoring mentation, if remains stable, probable discharge home on Sunday  Acute on chronic respiratory failure with hypoxia and hypercapnia (HCC) - see encephalopathy - s/p BIPAP, now use PRN only - continue O2, goal SpO2>88% only, no need for higher unless with exertion short term then can turn back down  Chronic pain - multiple sedating pain meds: fentanyl patch, hydrocodone, lyrica - discussed at length bedside with his daughter in law regarding the need for decreasing  - hold all for now inpatient - at discharge: decrease fent patch to 12.5 and keep weaning off all together in the coming weeks; decrease lyrica; continue hydrocodone   Diabetes mellitus type 2 in nonobese (HCC) -Continue Metformin and Tradjenta - SSI and CBG monitoring   Essential hypertension -Continue lisinopril    Antimicrobials: N/A  DVT prophylaxis: Lovenox Code Status: Full Family Communication: Daughter-in-law Disposition Plan: Status is: Inpatient  Remains inpatient appropriate because:Altered mental status, IV treatments appropriate due to intensity of illness or inability to take PO and Inpatient level of care appropriate due to severity of illness   Dispo: The patient is from: Home              Anticipated d/c is to: Home  Anticipated d/c date is: 1 day              Patient currently is not medically stable to d/c.       Objective: Blood pressure 136/68, pulse 94, temperature 97.6 F (36.4 C), temperature source Oral, resp. rate (!) 27, height 5\' 9"   (1.753 m), weight 77.1 kg, SpO2 93 %.  Examination: General appearance: alert, cooperative and no distress Head: Normocephalic, without obvious abnormality, atraumatic Eyes: EOMI Lungs: Tight but clear and distant breath sounds, no wheezing Heart: regular rate and rhythm and S1, S2 normal Abdomen: Soft, nontender, slightly hypoactive bowel sounds Extremities: No edema Skin: mobility and turgor normal Neurologic: Grossly normal  Consultants:   N/A  Procedures:   N/A  Data Reviewed: I have personally reviewed following labs and imaging studies Results for orders placed or performed during the hospital encounter of 01/05/21 (from the past 24 hour(s))  Lactic acid, plasma     Status: None   Collection Time: 01/05/21  3:12 PM  Result Value Ref Range   Lactic Acid, Venous 1.1 0.5 - 1.9 mmol/L  Blood culture (routine x 2)     Status: None (Preliminary result)   Collection Time: 01/05/21  3:25 PM   Specimen: BLOOD LEFT FOREARM  Result Value Ref Range   Specimen Description      BLOOD LEFT FOREARM Performed at [facility], [address], [place], [place] [address]    Special Requests      BOTTLES DRAWN AEROBIC AND ANAEROBIC Blood Culture adequate volume Performed at [facility], [address] [place], [place] [address]    Culture      NO GROWTH < 24 HOURS Performed at [facility], [place] [address] [place], [place] [address]    Report Status PENDING   TSH     Status: None   Collection Time: 01/05/21  3:42 PM  Result Value Ref Range   TSH 0.706 0.350 - 4.500 uIU/mL  Resp Panel by RT-PCR (Flu A&B, Covid) Nasopharyngeal Swab     Status: None   Collection Time: 01/05/21  3:55 PM   Specimen: Nasopharyngeal Swab; Nasopharyngeal(NP) swabs in vial transport medium  Result Value Ref Range   SARS Coronavirus 2 by RT PCR NEGATIVE NEGATIVE   Influenza A by PCR NEGATIVE NEGATIVE   Influenza B by PCR NEGATIVE NEGATIVE  I-Stat arterial blood gas, ED      Status: Abnormal   Collection Time: 01/05/21  6:15 PM  Result Value Ref Range   pH, Arterial 7.422 7.350 - 7.450   pCO2 arterial 66.2 (HH) 32.0 - 48.0 mmHg   pO2, Arterial 92 83.0 - 108.0 mmHg   Bicarbonate 43.3 (H) 20.0 - 28.0 mmol/L   TCO2 45 (H) 22 - 32 mmol/L   O2 Saturation 97.0 %   Acid-Base Excess 16.0 (H) 0.0 - 2.0 mmol/L   Sodium 139 135 - 145 mmol/L   Potassium 3.5 3.5 - 5.1 mmol/L   Calcium, Ion 1.17 1.15 - 1.40 mmol/L   HCT 32.0 (L) 39.0 - 52.0 %   Hemoglobin 10.9 (L) 13.0 - 17.0 g/dL   Patient temperature 97.9 F    Collection site Radial    Drawn by Operator    Sample type ARTERIAL    Comment NOTIFIED PHYSICIAN   Glucose, capillary     Status: Abnormal   Collection Time: 01/06/21  1:37 AM  Result Value Ref Range   Glucose-Capillary 149 (H) 70 - 99  mg/dL   Comment 1 Notify RN    Comment 2 Document in Chart   MRSA PCR Screening     Status: None   Collection Time: 01/06/21  1:39 AM   Specimen: Nasopharyngeal  Result Value Ref Range   MRSA by PCR NEGATIVE NEGATIVE  CBC     Status: Abnormal   Collection Time: 01/06/21  7:37 AM  Result Value Ref Range   WBC 10.0 4.0 - 10.5 K/uL   RBC 3.40 (L) 4.22 - 5.81 MIL/uL   Hemoglobin 11.1 (L) 13.0 - 17.0 g/dL   HCT 33.9 (L) 39.0 - 52.0 %   MCV 99.7 80.0 - 100.0 fL   MCH 32.6 26.0 - 34.0 pg   MCHC 32.7 30.0 - 36.0 g/dL   RDW 13.4 11.5 - 15.5 %   Platelets 324 150 - 400 K/uL   nRBC 0.0 0.0 - 0.2 %  Basic metabolic panel     Status: Abnormal   Collection Time: 01/06/21  7:37 AM  Result Value Ref Range   Sodium 143 135 - 145 mmol/L   Potassium 3.3 (L) 3.5 - 5.1 mmol/L   Chloride 92 (L) 98 - 111 mmol/L   CO2 37 (H) 22 - 32 mmol/L   Glucose, Bld 166 (H) 70 - 99 mg/dL   BUN 23 8 - 23 mg/dL   Creatinine, Ser 0.83 0.61 - 1.24 mg/dL   Calcium 8.8 (L) 8.9 - 10.3 mg/dL   GFR, Estimated >60 >60 mL/min   Anion gap 14 5 - 15  Hemoglobin A1c     Status: Abnormal   Collection Time: 01/06/21  7:37 AM  Result Value Ref Range    Hgb A1c MFr Bld 6.8 (H) 4.8 - 5.6 %   Mean Plasma Glucose 148.46 mg/dL  Glucose, capillary     Status: Abnormal   Collection Time: 01/06/21  7:48 AM  Result Value Ref Range   Glucose-Capillary 176 (H) 70 - 99 mg/dL   Comment 1 Notify RN    Comment 2 Document in Chart   Glucose, capillary     Status: Abnormal   Collection Time: 01/06/21 11:58 AM  Result Value Ref Range   Glucose-Capillary 287 (H) 70 - 99 mg/dL    Recent Results (from the past 240 hour(s))  Urine culture     Status: Abnormal   Collection Time: 01/05/21 12:53 PM   Specimen: Urine, Clean Catch  Result Value Ref Range Status   Specimen Description   Final    URINE, CLEAN CATCH Performed at [facility], [address] [place], [place] [address]    Special Requests   Final    NONE Performed at [facility], [address] [place], [place] [address]    Culture MULTIPLE SPECIES PRESENT, SUGGEST RECOLLECTION (A)  Final   Report Status 01/06/2021 FINAL  Final  Blood culture (routine x 2)     Status: None (Preliminary result)   Collection Time: 01/05/21 12:53 PM   Specimen: BLOOD  Result Value Ref Range Status   Specimen Description   Final    BLOOD LEFT ANTECUBITAL Performed at [facility], [address] [place], [place] [address]    Special Requests   Final    BOTTLES DRAWN AEROBIC AND ANAEROBIC Blood Culture adequate volume Performed at [facility], [address] [place], [place] [address]    Culture   Final    NO GROWTH < 24 HOURS Performed  at [facility], [place] [address], [place], [place] [address]    Report Status PENDING  Incomplete  Blood culture (routine x 2)     Status: None (Preliminary result)   Collection Time: 01/05/21  3:25 PM   Specimen: BLOOD LEFT FOREARM  Result Value Ref Range Status   Specimen Description   Final    BLOOD LEFT FOREARM Performed at [facility], [place] [address], [place], [place] [address]    Special Requests    Final    BOTTLES DRAWN AEROBIC AND ANAEROBIC Blood Culture adequate volume Performed at [facility], [address] [place], [place] [address]    Culture   Final    NO GROWTH < 24 HOURS Performed at [facility], [place] [address] [place], [place] [address]    Report Status PENDING  Incomplete  Resp Panel by RT-PCR (Flu A&B, Covid) Nasopharyngeal Swab     Status: None   Collection Time: 01/05/21  3:55 PM   Specimen: Nasopharyngeal Swab; Nasopharyngeal(NP) swabs in vial transport medium  Result Value Ref Range Status   SARS Coronavirus 2 by RT PCR NEGATIVE NEGATIVE Final    Comment: (NOTE) SARS-CoV-2 target nucleic acids are NOT DETECTED.  The SARS-CoV-2 RNA is generally detectable in upper respiratory specimens during the acute phase of infection. The lowest concentration of SARS-CoV-2 viral copies this assay can detect is 138 copies/mL. A negative result does not preclude SARS-Cov-2 infection and should not be used as the sole basis for treatment or other patient management decisions. A negative result may occur with  improper specimen collection/handling, submission of specimen other than nasopharyngeal swab, presence of viral mutation(s) within the areas targeted by this assay, and inadequate number of viral copies(<138 copies/mL). A negative result must be combined with clinical observations, patient history, and epidemiological information. The expected result is Negative.  Fact Sheet for Patients:  EntrepreneurPulse.com.au  Fact Sheet for Healthcare Providers:  IncredibleEmployment.be  This test is no t yet approved or cleared by the [place] FDA and  has been authorized for detection and/or diagnosis of SARS-CoV-2 by FDA under an Emergency Use Authorization (EUA). This EUA will remain  in effect (meaning this test can be used) for the duration of the COVID-19 declaration under Section 564(b)(1) of the Act,  21 U.S.C.section 360bbb-3(b)(1), unless the authorization is terminated  or revoked sooner.       Influenza A by PCR NEGATIVE NEGATIVE Final   Influenza B by PCR NEGATIVE NEGATIVE Final    Comment: (NOTE) The Xpert Xpress SARS-CoV-2/FLU/RSV plus assay is intended as an aid in the diagnosis of influenza from Nasopharyngeal swab specimens and should not be used as a sole basis for treatment. Nasal washings and aspirates are unacceptable for Xpert Xpress SARS-CoV-2/FLU/RSV testing.  Fact Sheet for Patients: EntrepreneurPulse.com.au  Fact Sheet for Healthcare Providers: IncredibleEmployment.be  This test is not yet approved or cleared by the [place] FDA and has been authorized for detection and/or diagnosis of SARS-CoV-2 by FDA under an Emergency Use Authorization (EUA). This EUA will remain in effect (meaning this test can be used) for the duration of the COVID-19 declaration under Section 564(b)(1) of the Act, 21 U.S.C. section 360bbb-3(b)(1), unless the authorization is terminated or revoked.  Performed at [facility], [address] [place], [place] [address]   MRSA PCR Screening     Status: None   Collection Time: 01/06/21  1:39 AM   Specimen: Nasopharyngeal  Result Value Ref Range Status   MRSA by PCR NEGATIVE NEGATIVE Final    Comment:        The GeneXpert MRSA Assay (FDA approved for NASAL specimens only), is one component of a comprehensive MRSA colonization surveillance program. It is not intended to diagnose MRSA infection nor to guide or monitor treatment for MRSA infections. Performed at [facility], [place] [address] [place], [place] [address]      Radiology Studies: CT Head Wo Contrast  Result Date: 01/05/2021 CLINICAL DATA:  Head trauma. Confusion. Head trauma November time point EXAM: CT HEAD WITHOUT CONTRAST TECHNIQUE: Contiguous axial images were obtained from the base of the  skull through the vertex without intravenous contrast. COMPARISON:  None. FINDINGS: Brain: No acute intracranial hemorrhage. No focal mass lesion. No CT evidence of acute infarction. No midline shift or mass effect. No hydrocephalus. Basilar cisterns are patent. There are periventricular and subcortical white matter hypodensities. Generalized cortical atrophy. Vascular: No hyperdense vessel or unexpected calcification. Skull: Normal. Negative for fracture or focal lesion. Sinuses/Orbits: Chronic opacification of the RIGHT maxillary sinus with sinus wall thickening. Orbits are normal. Other: None. IMPRESSION: 1. No acute intracranial findings. 2. Atrophy and white matter microvascular disease. 3. Chronic RIGHT maxillary sinusitis. Electronically Signed   By: Suzy Bouchard M.D.   On: 01/05/2021 12:14   DG Chest Portable 1 View  Result Date: 01/05/2021 CLINICAL DATA:  Per family has had confusion, weakness since after thanksgiving. Per son he fell during the night hitting the back of his head. Per son was seen by ems and told there was an 8 hour wait in all of the Ed's so he should wait until today. Head CT today HX of htn and diabetes EXAM: PORTABLE CHEST 1 VIEW COMPARISON:  11/18/2020 FINDINGS: Stable changes from prior cardiac surgery. Cardiac silhouette top-normal in size. No mediastinal or hilar masses. Mild lung base opacities consistent with scarring or atelectasis, accentuated by low lung volumes, similar to the prior study. No convincing pneumonia and no evidence of pulmonary edema. No pleural effusion or pneumothorax. Skeletal structures are grossly intact. IMPRESSION: No acute cardiopulmonary disease. Electronically Signed   By: Lajean Manes M.D.   On: 01/05/2021 13:43   DG Chest Portable 1 View  Final Result    CT Head Wo Contrast  Final Result      Scheduled Meds: . aspirin EC  81 mg Oral Daily  . Chlorhexidine Gluconate Cloth  6 each Topical Daily  . clopidogrel  75 mg Oral Daily  .  enoxaparin (LOVENOX) injection  40 mg Subcutaneous Q24H  . escitalopram  20 mg Oral Daily  . furosemide  20 mg Oral Daily  . insulin aspart  0-5 Units Subcutaneous QHS  . insulin aspart  0-9 Units Subcutaneous TID WC  . linagliptin  5 mg Oral Daily  . lisinopril  5 mg Oral Daily  . mouth rinse  15 mL Mouth Rinse BID  . metFORMIN  1,000 mg Oral BID WC  . metoprolol succinate  25 mg Oral Daily  . [START ON 01/07/2021] pneumococcal 23 valent vaccine  0.5 mL Intramuscular Tomorrow-1000  . tamsulosin  0.4 mg Oral BID   PRN Meds: acetaminophen **OR** acetaminophen, HYDROcodone-acetaminophen, senna-docusate Continuous Infusions:   LOS: 0 days  Time spent: Greater than 50% of the 35 minute visit was spent in counseling/coordination of care for the patient as laid out in the A&P.   Dwyane Dee, MD [facility] 01/06/2021, 2:25 PM

## 2021-01-06 NOTE — Assessment & Plan Note (Addendum)
-  Continue Metformin and Tradjenta - SSI and CBG monitoring

## 2021-01-06 NOTE — H&P (Signed)
History and Physical    Allen [name] IZT:[id number] DOB: 09-05-1929 DOA: 01/05/2021  PCP: Shon Baton, MD   Patient coming from: Home via [facility] ER  Chief Complaint: confusion  HPI: Allen [name] is a 87 y.o. male with medical history significant for hypertension, hyperlipidemia, CAD status post CABG, diabetes, GERD who presents with confusion and had a fall at home last night. No family present at this time. Details of events from ER documentation.  According to family report Allen [name] has been having gradually worsening of general weakness and confusion since Thanksgiving 2021. The famiily reported that he has been getting progressively worse and has been more confused over the past few days. They report that he had a fall on night of 01/03/21 and EMS came to the house and evaluated but was not brought for assessment due to long wait times. He does take Plavix and was having some headaches and was more confused on 01/04/21 so family brought him to ER for evaluation. Patient is normally talkative and alert but was somnolent when not being stimulated and spoken to for past 2-3 days.  Patient has been having more shallow breathing as well.  Patient is not complaining of chest pain or shortness of breath and he normally is on 3 to 4 L nasal cannula.  He is not complaining of abdominal pain, leg pain, leg swelling worse than baseline, nausea, vomiting, or bowel changes.  He does have chronic frequency as he takes Flomax with his BPH.  He is on chronic pain medications including fentanyl patch.   ED Course: Found to have hypercapnia on ABG in the emergency room.  No sign of infection on urinalysis and chest x-ray was negative for pneumonia.  Patient did not have any fever.  Head CT was negative for acute pathology.  Hospitalist service was asked to admit for further management with his hypercapnia and encephalopathy  Review of Systems:  Unable to obtain accurate review of systems secondary to confusion.  Patient denies any pain, fever, cough  Past Medical History:  Diagnosis Date  . Arthritis   . Coronary artery disease   . Diabetes mellitus without complication ([place])   . GERD (gastroesophageal reflux disease)   . Hyperlipidemia   . Hypertension     History reviewed. No pertinent surgical history.  Social History  reports that he quit smoking about 58 years ago. His smoking use included cigarettes. He has a 12.00 pack-year smoking history. He has never used smokeless tobacco. He reports that he does not drink alcohol and does not use drugs.  No Known Allergies  History reviewed. No pertinent family history.   Prior to Admission medications   Medication Sig Start Date End Date Taking? Authorizing Provider  aspirin 81 MG EC tablet Take by mouth. 01/04/19   [provider]  busPIRone (BUSPAR) 5 MG tablet Take 5 mg by mouth 2 (two) times daily. 12/10/20   [provider]  Calcium Polycarbophil (FIBER) 625 MG TABS TAKE 625 MILLIGRAMS ORAL DAILY 01/04/19   [provider]  clopidogrel (PLAVIX) 75 MG tablet Take 1 tablet by mouth daily. 07/21/20   [provider]  cyanocobalamin 1000 MCG tablet Take by mouth. 01/04/19   [provider]  escitalopram (LEXAPRO) 20 MG tablet Take 1 tablet by mouth daily. 01/12/19   [provider]  esomeprazole (NEXIUM) 40 MG capsule Take by mouth. 01/04/19   [provider]  famotidine (PEPCID) 20 MG tablet Take by mouth. 01/04/19  [provider]  fentaNYL (DURAGESIC) 25 MCG/HR SMARTSIG:Topical 11/13/20   [provider]  furosemide (LASIX) 20 MG tablet Take 1 tablet by mouth daily. 08/25/20   [provider]  HYDROcodone-acetaminophen (NORCO) 7.5-325 MG tablet Take by mouth. 02/03/19   [provider]  linaclotide (LINZESS) 290 MCG CAPS capsule Take 1 capsule (290 mcg total) by mouth daily before breakfast for 15 days. 08/14/20 08/29/20  Arta Silence, MD   linaclotide (LINZESS) 290 MCG CAPS capsule Take 290 mcg by mouth daily before breakfast.    [provider]  lisinopril (ZESTRIL) 5 MG tablet Take by mouth. 03/21/20   [provider]  loratadine (CLARITIN) 10 MG tablet TAKE 10 MILLIGRAMS ORAL DAILY 01/04/19   [provider]  magnesium oxide (MAG-OX) 400 MG tablet Take by mouth. 08/30/19   [provider]  metFORMIN (GLUCOPHAGE) 500 MG tablet 1000 mcg PO BID 01/14/19   [provider]  metoprolol succinate (TOPROL-XL) 25 MG 24 hr tablet Take 1 tablet by mouth daily. 09/29/20   [provider]  naloxone (NARCAN) 4 MG/0.1ML LIQD nasal spray kit CALL 911. ADMINISTER A SINGLE SPRAY OF NARCAN IN ONE NOSTRIL. REPEAT EVERY 3 MINUTES AS NEEDED IF NO OR MINIMAL RESPONSE. 06/13/20   [provider]  pioglitazone (ACTOS) 15 MG tablet Take by mouth. 01/06/20   [provider]  pregabalin (LYRICA) 50 MG capsule TAKE 50 MILLIGRAMS ORAL TWICE A DAY 01/04/19   [provider]  sitaGLIPtin (JANUVIA) 50 MG tablet Take by mouth. 07/08/19   [provider]  tamsulosin (FLOMAX) 0.4 MG CAPS capsule TAKE 0.4 MILLIGRAMS ORAL TWICE A DAY 01/04/19   [provider]  tiZANidine (ZANAFLEX) 2 MG tablet Take by mouth. 12/18/18   [provider]    Physical Exam: Vitals:   01/05/21 2300 01/06/21 0000 01/06/21 0135 01/06/21 0200  BP: 124/80 131/85 (!) 168/80   Pulse: 93 91 96 97  Resp: 14 13 (!) 22 (!) 22  Temp: 98 F (36.7 C)  99.1 F (37.3 C)   TempSrc:   Axillary   SpO2: 100% 100% 96% 92%  Weight:      Height:        Constitutional: NAD, calm, comfortable Vitals:   01/05/21 2300 01/06/21 0000 01/06/21 0135 01/06/21 0200  BP: 124/80 131/85 (!) 168/80   Pulse: 93 91 96 97  Resp: 14 13 (!) 22 (!) 22  Temp: 98 F (36.7 C)  99.1 F (37.3 C)   TempSrc:   Axillary   SpO2: 100% 100% 96% 92%  Weight:      Height:       General: WDWN, Alert and oriented to self.   Eyes: EOMI, PERRL, conjunctivae normal.  Sclera nonicteric HENT:  [place]/AT, external ears normal.  Nares patent without epistasis.  Mucous membranes are moist.  Neck: Soft, normal range of motion, supple, no masses, no thyromegaly. Trachea midline Respiratory: Equal but diminished breath sounds. Diffuse scattered rales. No wheezing, no crackles. Normal respiratory effort. No accessory muscle use.  Cardiovascular: Regular rate and rhythm, no murmurs / rubs / gallops. No extremity edema.   Abdomen: Soft, no tenderness, nondistended, no rebound or guarding.  No masses palpated. Bowel sounds normoactive Musculoskeletal: Moves all four extremities spontaneously. no clubbing / cyanosis. Normal muscle tone.  Skin: Warm, dry, intact no rashes, lesions, ulcers. No induration Neurologic: CN 2-12 grossly intact. Normal speech.  Sensation intact. Strength 5/5 in all extremities.   Psychiatric: Confused. Flat affect  Labs on Admission: I have personally reviewed following labs and imaging studies  CBC: Recent Labs  Lab 01/05/21 1253 01/05/21 1358 01/05/21 1815  WBC 10.5  --   --   NEUTROABS 8.9*  --   --   HGB 11.7* 11.2* 10.9*  HCT 35.2* 33.0* 32.0*  MCV 98.1  --   --   PLT 340  --   --     Basic Metabolic Panel: Recent Labs  Lab 01/05/21 1253 01/05/21 1358 01/05/21 1815  NA 141 139 139  K 3.5 3.6 3.5  CL 91*  --   --   CO2 37*  --   --   GLUCOSE 185*  --   --   BUN 22  --   --   CREATININE 0.87  --   --   CALCIUM 9.2  --   --     GFR: Estimated Creatinine Clearance: 57.6 mL/min (by C-G formula based on SCr of 0.87 mg/dL).  Liver Function Tests: Recent Labs  Lab 01/05/21 1253  AST 17  ALT 16  ALKPHOS 40  BILITOT 0.5  PROT 7.1  ALBUMIN 4.2    Urine analysis:    Component Value Date/Time   COLORURINE YELLOW 01/05/2021 1253   APPEARANCEUR CLEAR 01/05/2021 1253   LABSPEC 1.025 01/05/2021 1253   PHURINE 5.0 01/05/2021 1253   GLUCOSEU NEGATIVE 01/05/2021 1253   HGBUR  SMALL (A) 01/05/2021 1253   BILIRUBINUR NEGATIVE 01/05/2021 1253   Allen [name] 01/05/2021 1253   PROTEINUR NEGATIVE 01/05/2021 1253   NITRITE NEGATIVE 01/05/2021 1253   Allen [name] 01/05/2021 1253    Radiological Exams on Admission: CT Head Wo Contrast  Result Date: 01/05/2021 CLINICAL DATA:  Head trauma. Confusion. Head trauma November time point EXAM: CT HEAD WITHOUT CONTRAST TECHNIQUE: Contiguous axial images were obtained from the base of the skull through the vertex without intravenous contrast. COMPARISON:  None. FINDINGS: Brain: No acute intracranial hemorrhage. No focal mass lesion. No CT evidence of acute infarction. No midline shift or mass effect. No hydrocephalus. Basilar cisterns are patent. There are periventricular and subcortical white matter hypodensities. Generalized cortical atrophy. Vascular: No hyperdense vessel or unexpected calcification. Skull: Normal. Negative for fracture or focal lesion. Sinuses/Orbits: Chronic opacification of the RIGHT maxillary sinus with sinus wall thickening. Orbits are normal. Other: None. IMPRESSION: 1. No acute intracranial findings. 2. Atrophy and white matter microvascular disease. 3. Chronic RIGHT maxillary sinusitis. Electronically Signed   By: Suzy Bouchard M.D.   On: 01/05/2021 12:14   DG Chest Portable 1 View  Result Date: 01/05/2021 CLINICAL DATA:  Per family has had confusion, weakness since after thanksgiving. Per son he fell during the night hitting the back of his head. Per son was seen by ems and told there was an 8 hour wait in all of the Ed's so he should wait until today. Head CT today HX of htn and diabetes EXAM: PORTABLE CHEST 1 VIEW COMPARISON:  11/18/2020 FINDINGS: Stable changes from prior cardiac surgery. Cardiac silhouette top-normal in size. No mediastinal or hilar masses. Mild lung base opacities consistent with scarring or atelectasis, accentuated by low lung volumes, similar to the prior study. No  convincing pneumonia and no evidence of pulmonary edema. No pleural effusion or pneumothorax. Skeletal structures are grossly intact. IMPRESSION: No acute cardiopulmonary disease. Electronically Signed   By: Lajean Manes M.D.   On: 01/05/2021 13:43     Assessment/Plan Principal Problem:   Acute metabolic encephalopathy Allen [name] presented with confusion and  somnolence. Has been on Bipap for a few hours and more alert than previous description. Negative CT scan of head. Negative workup for infection.  Will limit narcotic pain medication and use Norco every 6 hours as needed.  Fentanyl patch will be held with his encephalopathy and somnolence  Active Problems:   Hypercapnia Pt with elevated pCO2 on ABG. Placed on Bipap in Er. Is slowing improving.  Will recheck ABG in am.     Somnolence Secondary to hypercapnia. Continue treatment with Bipap. Pt is more alert now after being on Bipap for a few hours.     Essential hypertension Monitor BP. Continue lisinopril and metoprolol.     Diabetes mellitus type 2 in nonobese  Continue metformin and Trajenda used in place of Januvia per formulary  Check HgbA1c.  Monitor blood sugar with meals and bedtime, SSI provided as needed for glycemic control.    Fall Allen [name] had fall at home yesterday. Negative CT head in ER.  Placed on fall precautions.  Consult PT    DVT prophylaxis: Lovenox for DVT prophylaxis.   Code Status:   Full code  Family Communication:  No family at bedside. No answer on the phone when attempted to call to update family members Disposition Plan:   Patient is from:  Home  Anticipated DC to:  Will be determined during hospitalization  Anticipated DC date:  Anticipate greater than 2 midnights in hospital to treat acute condition  Anticipated DC barriers: Barriers to discharge would be if a rehab bed is required and would      need to be arranged  Admission status:  Inpatient   Yevonne Aline Lennon Richins MD [facility]  How to contact the [facility] Attending or Consulting provider [facility] or covering provider during after hours [place], for this patient?   1. Check the care team in [facility] and look for a) attending/consulting [facility] provider listed and b) the [facility] team listed 2. Log into [email or web address] and use [place]'s universal password to access. If you do not have the password, please contact the hospital operator. 3. Locate the [facility] provider you are looking for under [facility] and page to a number that you can be directly reached. 4. If you still have difficulty reaching the provider, please page the [facility] (Director on Call) for the Hospitalists listed on amion for assistance.  01/06/2021, 2:15 AM

## 2021-01-06 NOTE — Assessment & Plan Note (Addendum)
-   see encephalopathy - s/p BIPAP, now use PRN only - continue O2, goal SpO2>88% only, no need for higher unless with exertion short term then can turn back down -Weaned down to 2 L nasal cannula and tolerated well with no desaturations even with ambulating in the hallway.  Continue 2 L at discharge.  Only needs to be turned up for exertion at home if desats but then turned back down after rest

## 2021-01-06 NOTE — Assessment & Plan Note (Signed)
Continue lisinopril

## 2021-01-06 NOTE — Assessment & Plan Note (Addendum)
-   multiple sedating pain meds: fentanyl patch, hydrocodone, lyrica - discussed at length bedside with his daughter in law regarding the need for decreasing  - hold all for now inpatient - at discharge: decrease fent patch to 12 mcg; decrease norco to 5/325 BIDPRN, d/c lyrica. Decrease tizanidine to qhs from BID -Short-term prescriptions were sent to his pharmacy but he will need to follow-up with providers for refills to continue lower regimen

## 2021-01-06 NOTE — Assessment & Plan Note (Addendum)
-  Multifactorial but most likely contributed by accumulation of multiple sedating agents notably his fentanyl, Lyrica, Norco.  He is also 87 years old and has been progressively declining at home.  Furthermore, oxygen may also be too much at home, SPO2 goal greater than 88% only which has been achieved with only 2 L oxygen currently inpatient (will likely reduce his home O2 requirement prior to discharge) -Mentation cleared and patient returned to his normal state prior to discharge.  Etiology was considered polypharmacy especially multiple sedating agents/pain regimen

## 2021-01-06 NOTE — Hospital Course (Addendum)
Allen [name] is an 87 yo male with PMH chronic pain (on fentanyl patch, hydrocodone, and lyrica), HTN, HLD, DMII, CAD s/p CABG, arthritis, GERD, newly diagnosed restrictive lung disease with chronic hypoxia and started on O2 (3-4L at home) in Dec 2021. He has had an overall slow but progressive decline from his multiple underlying medical conditions since approximately November 2021.  He was also evaluated by pulmonology in December 2021 with recommendations for family to consider palliative care evaluation as the patient appears to be globally declining slowly.  Family is also aware of this and understands that the patient has progressively started eating less, become less active, and seems to be overall declining.  They have arranged for a palliative care evaluation this month.  His family manages his chronic pain medications and he was brought in for worsening confusion/mentation as well as increased somnolence. Work-up was significant for hypercarbic respiratory failure and he was placed on BiPAP with good response.  His hypercarbia corrected and his mentation improved.  He became more alert and oriented and returned back to normal state of health.  His oxygen was weaned slowly down to 2 L.  He also ambulated and did not desaturate while on 2 L.  He was recommended to continue this regimen at home instead of the typical 3 to 4 L he was on.  He will also continue following with home health PT.  Due to etiology of his presentation considered from multiple underlying sedating agents, his pain regimen was modified at discharge and he will need to follow-up closely with his prescribing providers for further refills and reevaluation.  This was discussed at length with his daughter-in-law and the patient bedside regarding the need for decreasing his pain regimen. Fentanyl patch was decreased from 25 mcg every 48 hours down to 12 mcg every 72 hours with goal of hopefully further weaning and ultimate  discontinuation in the future however he does state that the patch gives him the most relief when compared to his other pain medications. Hydrocodone was also decreased down to Norco 5-325  BID PRN(previously was 7.5-325 TIDPRN). Lyrica was discontinued all together (if he is able to tolerate this).  Tizanidine was also decreased down to nightly only from twice daily.

## 2021-01-06 NOTE — Evaluation (Signed)
Physical Therapy Evaluation Patient Details Name: Allen [name] MRN: [id number] DOB: [birth year] Today's Date: 01/06/2021   History of Present Illness  87 y.o. male with medical history significant for hypertension, hyperlipidemia, CAD status post CABG, diabetes, GERD who presents with confusion and had a fall at home last night. admitted for further management with his hypercapnia and encephalopathy  Clinical Impression  Pt admitted with above diagnosis.  Pt currently with functional limitations due to the deficits listed below (see PT Problem List). Pt will benefit from skilled PT to increase their independence and safety with mobility to allow discharge to the venue listed below.    Pt amb ~ 8' with RW and min assist. [place] in place however O2 tubing not connected on PT arrival. SpO2=92-93% on RA throughout. Pt has had functional decline since Nov 2021, they have a [place] meeting later this month per dtr-in-law. Recommend HHPT, family agreeable to [facility], supportive able to assist as needed. Will follow in acute setting    Follow Up Recommendations Home health PT;Supervision for mobility/OOB    Equipment Recommendations  None recommended by PT    Recommendations for Other Services       Precautions / Restrictions Precautions Precautions: Fall Precaution Comments: monitor sats, O2 not connected on PT arrival, pt had [place] in place      Mobility  Bed Mobility               General bed mobility comments: NT--pt in recliner    Transfers Overall transfer level: Needs assistance Equipment used: Rolling walker (2 wheeled) Transfers: Sit to/from Stand Sit to Stand: Min guard         General transfer comment: multi-modal cues to scoot fwd, for anterior  wt shift and hand placement, min/guard to rise and transition to RW  Ambulation/Gait Ambulation/Gait assistance: Min guard;Min assist Gait Distance (Feet): 8 Feet Assistive device: Rolling walker (2 wheeled) Gait  Pattern/deviations: Step-through pattern;Decreased stride length;Trunk flexed Gait velocity: decr   General Gait Details: multi-modal cues to continue amb, for posture. pt fatigues quickly asking to sit; VSS. SpO2=92% on RA  Stairs            Wheelchair Mobility    Modified Rankin (Stroke Patients Only)       Balance Overall balance assessment: Needs assistance Sitting-balance support: No upper extremity supported;Feet supported Sitting balance-Leahy Scale: Fair       Standing balance-Leahy Scale: Poor Standing balance comment: reliant on UEs                             Pertinent Vitals/Pain Pain Assessment: No/denies pain    Home Living Family/patient expects to be discharged to:: Private residence Living Arrangements: Children (lives with supportive son and dtr-in-law) Available Help at Discharge: Family;Available 24 hours/day Type of Home: House       Home Layout: One level Home Equipment: [place] - 4 wheels Additional Comments: per pt dtr-iin-law pt was very active until Nov 2021, functional decline since then. they are meeting with Palliative Care later this month    Prior Function Level of Independence: Independent with assistive device(s);Needs assistance   Gait / Transfers Assistance Needed: often needs assist with transfers per family report. amb with rollator           Hand Dominance        Extremity/Trunk Assessment   Upper Extremity Assessment Upper Extremity Assessment: Generalized weakness;Defer to OT evaluation    Lower  Extremity Assessment Lower Extremity Assessment: Generalized weakness       Communication   Communication: HOH  Cognition Arousal/Alertness: Awake/alert Behavior During Therapy: WFL for tasks assessed/performed   Area of Impairment: Following commands;Problem solving                       Following Commands: Follows one step commands with increased time;Follows multi-step commands  inconsistently     Problem Solving: Slow processing;Decreased initiation;Requires verbal cues;Requires tactile cues General Comments: pt states "no" to most requests and then asks "what do you want to do with me?" and cooperates      General Comments      Exercises     Assessment/Plan    PT Assessment Patient needs continued PT services  PT Problem List Decreased strength;Decreased activity tolerance;Decreased mobility;Decreased balance;Cardiopulmonary status limiting activity       PT Treatment Interventions DME instruction;Gait training;Functional mobility training;Therapeutic activities    PT Goals (Current goals can be found in the Care Plan section)  Acute Rehab PT Goals Patient Stated Goal: home soon PT Goal Formulation: With patient/family Time For Goal Achievement: 01/20/21 Potential to Achieve Goals: Good    Frequency Min 3X/week   Barriers to discharge        Co-evaluation               AM-PAC PT "6 Clicks" Mobility  Outcome Measure Help needed turning from your back to your side while in a flat bed without using bedrails?: A Little Help needed moving from lying on your back to sitting on the side of a flat bed without using bedrails?: A Little Help needed moving to and from a bed to a chair (including a wheelchair)?: A Little Help needed standing up from a chair using your arms (e.g., wheelchair or bedside chair)?: A Little Help needed to walk in hospital room?: A Little Help needed climbing 3-5 steps with a railing? : A Lot 6 Click Score: 17    End of Session Equipment Utilized During Treatment: Gait belt Activity Tolerance: Patient limited by fatigue Patient left: in chair;with call bell/phone within reach;with chair alarm set   PT Visit Diagnosis: Other abnormalities of gait and mobility (R26.89)    Time: 1194-1740 PT Time Calculation (min) (ACUTE ONLY): 15 min   Charges:   PT Evaluation $PT Eval Low Complexity: [place], PT  Acute Rehab Dept ([place]) [phone] Pager [phone]  01/06/2021   [facility] 01/06/2021, 2:05 PM

## 2021-01-07 DIAGNOSIS — J9622 Acute and chronic respiratory failure with hypercapnia: Secondary | ICD-10-CM | POA: Diagnosis not present

## 2021-01-07 DIAGNOSIS — G8929 Other chronic pain: Secondary | ICD-10-CM | POA: Diagnosis not present

## 2021-01-07 DIAGNOSIS — J9621 Acute and chronic respiratory failure with hypoxia: Secondary | ICD-10-CM | POA: Diagnosis not present

## 2021-01-07 DIAGNOSIS — G9341 Metabolic encephalopathy: Secondary | ICD-10-CM | POA: Diagnosis not present

## 2021-01-07 LAB — CBC WITH DIFFERENTIAL/PLATELET
Abs Immature Granulocytes: 0.04 10*3/uL (ref 0.00–0.07)
Basophils Absolute: 0 10*3/uL (ref 0.0–0.1)
Basophils Relative: 0 %
Eosinophils Absolute: 0 10*3/uL (ref 0.0–0.5)
Eosinophils Relative: 0 %
HCT: 30.9 % — ABNORMAL LOW (ref 39.0–52.0)
Hemoglobin: 10.1 g/dL — ABNORMAL LOW (ref 13.0–17.0)
Immature Granulocytes: 0 %
Lymphocytes Relative: 15 %
Lymphs Abs: 1.5 10*3/uL (ref 0.7–4.0)
MCH: 32 pg (ref 26.0–34.0)
MCHC: 32.7 g/dL (ref 30.0–36.0)
MCV: 97.8 fL (ref 80.0–100.0)
Monocytes Absolute: 0.8 10*3/uL (ref 0.1–1.0)
Monocytes Relative: 8 %
Neutro Abs: 7.7 10*3/uL (ref 1.7–7.7)
Neutrophils Relative %: 77 %
Platelets: 278 10*3/uL (ref 150–400)
RBC: 3.16 MIL/uL — ABNORMAL LOW (ref 4.22–5.81)
RDW: 13.2 % (ref 11.5–15.5)
WBC: 10.1 10*3/uL (ref 4.0–10.5)
nRBC: 0 % (ref 0.0–0.2)

## 2021-01-07 LAB — MAGNESIUM: Magnesium: 1.6 mg/dL — ABNORMAL LOW (ref 1.7–2.4)

## 2021-01-07 LAB — BASIC METABOLIC PANEL
Anion gap: 12 (ref 5–15)
BUN: 20 mg/dL (ref 8–23)
CO2: 36 mmol/L — ABNORMAL HIGH (ref 22–32)
Calcium: 8.4 mg/dL — ABNORMAL LOW (ref 8.9–10.3)
Chloride: 91 mmol/L — ABNORMAL LOW (ref 98–111)
Creatinine, Ser: 0.7 mg/dL (ref 0.61–1.24)
GFR, Estimated: >60 mL/min (ref >60)
Glucose, Bld: 192 mg/dL — ABNORMAL HIGH (ref 70–99)
Potassium: 3 mmol/L — ABNORMAL LOW (ref 3.5–5.1)
Sodium: 139 mmol/L (ref 135–145)

## 2021-01-07 LAB — GLUCOSE, CAPILLARY
Glucose-Capillary: 191 mg/dL — ABNORMAL HIGH (ref 70–99)
Glucose-Capillary: 165 mg/dL — ABNORMAL HIGH (ref 70–99)

## 2021-01-07 MED ORDER — POTASSIUM CHLORIDE CRYS ER 20 MEQ PO TBCR
40.0000 meq | EXTENDED_RELEASE_TABLET | Freq: Once | ORAL | Status: AC
  Administered 2021-01-07: 40 meq via ORAL
  Filled 2021-01-07: qty 2

## 2021-01-07 MED ORDER — TIZANIDINE HCL 2 MG PO TABS: 2.0000 mg | ORAL_TABLET | Freq: Every day | ORAL | 0 refills | Status: AC

## 2021-01-07 MED ORDER — FENTANYL 12 MCG/HR TD PT72: 1.0000 | MEDICATED_PATCH | TRANSDERMAL | 0 refills | Status: AC

## 2021-01-07 MED ORDER — HYDROXYZINE HCL 25 MG PO TABS
25.0000 mg | ORAL_TABLET | Freq: Once | ORAL | Status: AC
  Administered 2021-01-07: 25 mg via ORAL
  Filled 2021-01-07: qty 1

## 2021-01-07 MED ORDER — MAGNESIUM OXIDE 400 (241.3 MG) MG PO TABS
800.0000 mg | ORAL_TABLET | Freq: Once | ORAL | Status: AC
  Administered 2021-01-07: 800 mg via ORAL
  Filled 2021-01-07: qty 2

## 2021-01-07 MED ORDER — HYDROCODONE-ACETAMINOPHEN 5-325 MG PO TABS: 1.0000 | ORAL_TABLET | Freq: Two times a day (BID) | ORAL | 0 refills | Status: AC | PRN

## 2021-01-07 NOTE — Discharge Instructions (Signed)
Your Fentanyl patch has been lowered to 12 mcg. Also only replace every 3 days. Discuss as soon as possible with your normal prescribing physician to have this dose continued and possibly further weaned off.   Your hydrocodone dose has also been lowered, please discuss this to have further refills.  Lyrica has been discontinued.  Tizanidine has been lowered to just at night instead of twice a day.   Your oxygen has been lowered to 2 L continuously. You might need extra at times with exertion if short of breath, but otherwise you can keep it at 2 L unless your oxygen level does drop in the future more.

## 2021-01-07 NOTE — TOC Initial Note (Addendum)
Transition of Care (TOC) - Initial/Assessment Note    Patient Details  Name: Allen [name]. Al MRN: [id number] Date of Birth: Aug 28, 1929  Transition of Care (TOC) CM/SW Contact:    Joaquin Courts, RN Phone Number: 01/07/2021, 2:16 PM  Clinical Narrative:                 CM spoke with patient's son re: dc planning.  HHPT to be provided by [facility], rep Corene Cornea given referral.  Son requests hospital bed for patient.  Referral given to [facility] rep Lucrecia, hospital bed to be delivered tomorrow.  Per conversation with son, they are comfortable with taking patient home and waiting for the delivery to arrive.  Per son, patient has continuous O2 at home, plan to transport patient by car and will bring portable O2 tank for transport.  Expected Discharge Plan: [place] Barriers to Discharge: No Barriers Identified   Patient Goals and CMS Choice Patient states their goals for this hospitalization and ongoing recovery are:: to go home with therapy CMS Medicare.gov Compare Post Acute Care list provided to:: Patient Represenative (must comment) Choice offered to / list presented to : Adult Children  Expected Discharge Plan and Services Expected Discharge Plan: [place]   Discharge Planning Services: CM Consult Post Acute Care Choice: [place] arrangements for the past 2 months: Single Family Home Expected Discharge Date: 01/07/21               DME Arranged: Hospital bed DME Agency: [facility] Date DME Agency Contacted: 01/07/21 Time DME Agency Contacted: [id number] Representative spoke with at DME Agency: [facility]: PT [place]: [place] ([place]) Date [place]: 01/07/21 Time [place]: 1414 Representative spoke with at [place]: Corene Cornea  Prior Living Arrangements/Services Living arrangements for the past 2 months: [place]   Patient language and need for interpreter reviewed:: Yes Do  you feel safe going back to the place where you live?: Yes      Need for Family Participation in Patient Care: Yes (Comment) Care giver support system in place?: Yes (comment)   Criminal Activity/Legal Involvement Pertinent to Current Situation/Hospitalization: No - Comment as needed  Activities of Daily Living Home Assistive Devices/Equipment: Blood pressure cuff,Walker (specify type) (four wheeled walker) ADL Screening (condition at time of admission) Patient's cognitive ability adequate to safely complete daily activities?: No Is the patient deaf or have difficulty hearing?: Yes Does the patient have difficulty seeing, even when wearing glasses/contacts?: Yes Does the patient have difficulty concentrating, remembering, or making decisions?: Yes Patient able to express need for assistance with ADLs?: Yes Does the patient have difficulty dressing or bathing?: Yes Independently performs ADLs?: No Communication: Independent Dressing (OT): Needs assistance Is this a change from baseline?: Pre-admission baseline Grooming: Independent Feeding: Independent Bathing: Needs assistance Is this a change from baseline?: Pre-admission baseline Toileting: Needs assistance Is this a change from baseline?: Pre-admission baseline In/Out Bed: Needs assistance Is this a change from baseline?: Pre-admission baseline Walks in Home: Needs assistance Is this a change from baseline?: Pre-admission baseline Does the patient have difficulty walking or climbing stairs?: Yes Weakness of Legs: Both Weakness of Arms/Hands: Both  Permission Sought/Granted                  Emotional Assessment Appearance:: Appears stated age         Psych Involvement: No (comment)  Admission diagnosis:  Somnolence [R40.0] Elevated CO2 level [R79.81]  Fall, initial encounter [W19.XXXA] Altered mental status, unspecified altered mental status type [N00.37] Acute metabolic encephalopathy [C48.88] Patient Active  Problem List   Diagnosis Date Noted  . Acute on chronic respiratory failure with hypoxia and hypercapnia ([place]) 01/06/2021  . Essential hypertension 01/06/2021  . Diabetes mellitus type 2 in nonobese ([place]) 01/06/2021  . Chronic pain 01/06/2021  . Acute metabolic encephalopathy 91/69/4503   PCP:  Shon Baton, MD Pharmacy:   [id number] - [place], [address] [place] [address] Phone: [phone] Fax: [phone]     Social Determinants of Health (SDOH) Interventions    Readmission Risk Interventions No flowsheet data found.

## 2021-01-07 NOTE — Progress Notes (Signed)
SATURATION QUALIFICATIONS: (This note is used to comply with regulatory documentation for home oxygen)  Patient Saturations on Room Air at Rest = 100%  Patient Saturations on Room Air while Ambulating = 86%  Patient Saturations on 2 Liters of oxygen while Ambulating = 91%  Please briefly explain why patient needs home oxygen: Patient desats while ambulating.

## 2021-01-07 NOTE — Progress Notes (Signed)
Patient belongings given to son. Discharge instructions reviewed with both patient and son. Patient was placed on portable oxygen when transferred from wheelchair to car.

## 2021-01-07 NOTE — Discharge Summary (Signed)
Physician Discharge Summary   Allen [name] XBO:[id number] DOB: Oct 02, 1929 DOA: 01/05/2021  PCP: Shon Baton, MD  Admit date: 01/05/2021 Discharge date: 01/07/2021  Admitted From: home Disposition:  home Discharging physician: Dwyane Dee, MD  Recommendations for Outpatient Follow-up:  1. Pain regimen was decreased due to oversedation, see below.  Will need refills 2. Oxygen was lowered to 2 L   Patient discharged to home in Discharge Condition: stable CODE STATUS: Full Diet recommendation:  Diet Orders (From admission, onward)    Start     Ordered   01/07/21 0000  Diet - low sodium heart healthy        01/07/21 1247   01/06/21 0411  Diet heart healthy/carb modified Room service appropriate? Yes; Fluid consistency: Thin  Diet effective now       Question Answer Comment  Diet-HS Snack? Nothing   Room service appropriate? Yes   Fluid consistency: Thin      01/06/21 0410          Hospital Course: Allen [name] is an 87 yo male with PMH chronic pain (on fentanyl patch, hydrocodone, and lyrica), HTN, HLD, DMII, CAD s/p CABG, arthritis, GERD, newly diagnosed restrictive lung disease with chronic hypoxia and started on O2 (3-4L at home) in Dec 2021. He has had an overall slow but progressive decline from his multiple underlying medical conditions since approximately November 2021.  He was also evaluated by pulmonology in December 2021 with recommendations for family to consider palliative care evaluation as the patient appears to be globally declining slowly.  Family is also aware of this and understands that the patient has progressively started eating less, become less active, and seems to be overall declining.  They have arranged for a palliative care evaluation this month.  His family manages his chronic pain medications and he was brought in for worsening confusion/mentation as well as increased somnolence. Work-up was significant for hypercarbic respiratory failure and he was  placed on BiPAP with good response.  His hypercarbia corrected and his mentation improved.  He became more alert and oriented and returned back to normal state of health.  His oxygen was weaned slowly down to 2 L.  He also ambulated and did not desaturate while on 2 L.  He was recommended to continue this regimen at home instead of the typical 3 to 4 L he was on.  He will also continue following with home health PT.  Due to etiology of his presentation considered from multiple underlying sedating agents, his pain regimen was modified at discharge and he will need to follow-up closely with his prescribing providers for further refills and reevaluation.  This was discussed at length with his daughter-in-law and the patient bedside regarding the need for decreasing his pain regimen. Fentanyl patch was decreased from 25 mcg every 48 hours down to 12 mcg every 72 hours with goal of hopefully further weaning and ultimate discontinuation in the future however he does state that the patch gives him the most relief when compared to his other pain medications. Hydrocodone was also decreased down to Norco 5-325  BID PRN(previously was 7.5-325 TIDPRN). Lyrica was discontinued all together (if he is able to tolerate this).  Tizanidine was also decreased down to nightly only from twice daily.   * Acute metabolic encephalopathy-resolved as of 01/07/2021 -Multifactorial but most likely contributed by accumulation of multiple sedating agents notably his fentanyl, Lyrica, Norco.  He is also 87 years old and has been progressively declining at home.  Furthermore, oxygen may also be too much at home, SPO2 goal greater than 88% only which has been achieved with only 2 L oxygen currently inpatient (will likely reduce his home O2 requirement prior to discharge) -Mentation cleared and patient returned to his normal state prior to discharge.  Etiology was considered polypharmacy especially multiple sedating agents/pain  regimen  Acute on chronic respiratory failure with hypoxia and hypercapnia ([place]) - see encephalopathy - s/p BIPAP, now use PRN only - continue O2, goal SpO2>88% only, no need for higher unless with exertion short term then can turn back down -Weaned down to 2 L nasal cannula and tolerated well with no desaturations even with ambulating in the hallway.  Continue 2 L at discharge.  Only needs to be turned up for exertion at home if desats but then turned back down after rest  Chronic pain - multiple sedating pain meds: fentanyl patch, hydrocodone, lyrica - discussed at length bedside with his daughter in law regarding the need for decreasing  - hold all for now inpatient - at discharge: decrease fent patch to 12 mcg; decrease norco to 5/325 BIDPRN, d/c lyrica. Decrease tizanidine to qhs from BID -Short-term prescriptions were sent to his pharmacy but he will need to follow-up with providers for refills to continue lower regimen  Diabetes mellitus type 2 in nonobese (HCC) -Continue Metformin and Tradjenta - SSI and CBG monitoring   Essential hypertension -Continue lisinopril    The patient's chronic medical conditions were treated accordingly per the patient's home medication regimen except as noted.  On day of discharge, patient was felt deemed stable for discharge. Patient/family member advised to call PCP or come back to ER if needed.   Principal Diagnosis: Acute metabolic encephalopathy  Discharge Diagnoses: Active Hospital Problems   Diagnosis Date Noted  . Acute on chronic respiratory failure with hypoxia and hypercapnia (HCC) 01/06/2021    Priority: High  . Essential hypertension 01/06/2021  . Diabetes mellitus type 2 in nonobese ([place]) 01/06/2021  . Chronic pain 01/06/2021    Resolved Hospital Problems   Diagnosis Date Noted Date Resolved  . Acute metabolic encephalopathy [id number]/6433 01/07/2021    Priority: High  . Somnolence 01/06/2021 01/06/2021  . Fall 01/06/2021  01/06/2021    Discharge Instructions    Diet - low sodium heart healthy   Complete by: As directed    Face-to-face encounter (required for Medicare/Medicaid patients)   Complete by: As directed    I Dwyane Dee certify that this patient is under my care and that I, or a nurse practitioner or physician's assistant working with me, had a face-to-face encounter that meets the physician face-to-face encounter requirements with this patient on 01/07/2021. The encounter with the patient was in whole, or in part for the following medical condition(s) which is the primary reason for home health care (List medical condition): Generalized muscle weakness, restrictive lung disease, chronic hypoxic respiratory failure   The encounter with the patient was in whole, or in part, for the following medical condition, which is the primary reason for home health care: Generalized muscle weakness, dyspnea   I certify that, based on my findings, the following services are medically necessary home health services: Physical therapy   Reason for Medically Necessary Home Health Services:  Therapy- Instruction on use of Assistive Device for Ambulation on all Surfaces Therapy- Home Adaptation to Facilitate Safety Therapy- Therapeutic Exercises to Increase Strength and Endurance     My clinical findings support the need for the above services: Shortness  of breath with activity   Further, I certify that my clinical findings support that this patient is homebound due to:  Ambulates short distances less than 300 feet Shortness of Breath with activity     For home use only DME Hospital bed   Complete by: As directed    Due to progressive decline and generalized muscle weakness with DOE and orthopnea and increased length of time in bed, patient requires a hospital bed for this as well as repositioning/elevating head which cannot be accomplished in a regular bed   Length of Need: Lifetime   Patient has (list medical  condition): Progressive physical decline, Generalized muscle weakness, chronic hypoxic respiratory failure   The above medical condition requires: Patient requires the ability to reposition frequently   Bed type: Semi-electric   Home Health   Complete by: As directed    To provide the following care/treatments: PT   Increase activity slowly   Complete by: As directed      Allergies as of 01/07/2021   No Known Allergies     Medication List    STOP taking these medications   esomeprazole 40 MG capsule Commonly known as: NEXIUM   fentaNYL 25 MCG/HR Commonly known as: DURAGESIC Replaced by: fentaNYL 12 MCG/HR   HYDROcodone-acetaminophen 7.5-325 MG tablet Commonly known as: [place] Replaced by: HYDROcodone-acetaminophen 5-325 MG tablet   pregabalin 50 MG capsule Commonly known as: LYRICA     TAKE these medications   aspirin 81 MG EC tablet Take by mouth.   busPIRone 5 MG tablet Commonly known as: BUSPAR Take 5 mg by mouth 2 (two) times daily.   clopidogrel 75 MG tablet Commonly known as: PLAVIX Take 1 tablet by mouth daily.   cyanocobalamin 1000 MCG tablet Take by mouth.   escitalopram 20 MG tablet Commonly known as: LEXAPRO Take 1 tablet by mouth daily.   famotidine 20 MG tablet Commonly known as: PEPCID Take 20 mg by mouth 2 (two) times daily.   fentaNYL 12 MCG/HR Commonly known as: [place] 1 patch onto the skin every 3 (three) days. Replaces: fentaNYL 25 MCG/HR   Fiber 625 MG Tabs Take 625 mg by mouth in the morning, at noon, and at bedtime.   furosemide 20 MG tablet Commonly known as: LASIX Take 1 tablet by mouth daily.   HYDROcodone-acetaminophen 5-325 MG tablet Commonly known as: NORCO/VICODIN Take 1 tablet by mouth 2 (two) times daily as needed for moderate pain or severe pain. Replaces: HYDROcodone-acetaminophen 7.5-325 MG tablet   linaclotide 290 MCG Caps capsule Commonly known as: LINZESS Take 290 mcg by mouth daily as needed.  Constipation   lisinopril 5 MG tablet Commonly known as: ZESTRIL Take 5 mg by mouth daily.   loratadine 10 MG tablet Commonly known as: CLARITIN Take 10 mg by mouth daily.   metFORMIN 500 MG tablet Commonly known as: GLUCOPHAGE Take 500 mg by mouth 2 (two) times daily with a meal.   Narcan 4 MG/0.1ML Liqd nasal spray kit Generic drug: naloxone CALL 911. ADMINISTER A SINGLE SPRAY OF NARCAN IN ONE NOSTRIL. REPEAT EVERY 3 MINUTES AS NEEDED IF NO OR MINIMAL RESPONSE.   propranolol 20 MG tablet Commonly known as: INDERAL Take 20 mg by mouth 2 (two) times daily.   sitaGLIPtin 50 MG tablet Commonly known as: JANUVIA Take by mouth.   tamsulosin 0.4 MG Caps capsule Commonly known as: FLOMAX Take 0.4 mg by mouth in the morning and at bedtime.   tiZANidine 2 MG tablet Commonly known  as: ZANAFLEX Take 1 tablet (2 mg total) by mouth at bedtime. What changed: when to take this            Durable Medical Equipment  (From admission, onward)         Start     Ordered   01/07/21 0000  For home use only DME Hospital bed       Comments: Due to progressive decline and generalized muscle weakness with DOE and orthopnea and increased length of time in bed, patient requires a hospital bed for this as well as repositioning/elevating head which cannot be accomplished in a regular bed  Question Answer Comment  Length of Need Lifetime   Patient has (list medical condition): Progressive physical decline, Generalized muscle weakness, chronic hypoxic respiratory failure   The above medical condition requires: Patient requires the ability to reposition frequently   Bed type Semi-electric      01/07/21 1406          Follow-up Information    Shon Baton, MD. Schedule an appointment as soon as possible for a visit in 1 week(s).   Specialty: Internal Medicine Contact information: [place] [address] [place], [place]  Follow up.   Why: agency will provide home health therapy. Contact information: [place] [address] [phone]              No Known Allergies  Consultations:   Discharge Exam: BP 130/73 (BP Location: Right Arm)   Pulse 92   Temp (!) 97.5 F (36.4 C) (Oral)   Resp (!) 26   Ht '5\' 9"'  (1.753 m)   Wt 77.1 kg   SpO2 97%   BMI 25.10 kg/m  General appearance: alert, cooperative and no distress Head: Normocephalic, without obvious abnormality, atraumatic Eyes: EOMI Lungs:  Improved breath sounds with more air movement, no wheezing Heart: regular rate and rhythm and S1, S2 normal Abdomen: Soft, nontender, slightly hypoactive bowel sounds Extremities: No edema Skin: mobility and turgor normal Neurologic: Grossly normal  The results of significant diagnostics from this hospitalization (including imaging, microbiology, ancillary and laboratory) are listed below for reference.   Microbiology: Recent Results (from the past 240 hour(s))  Urine culture     Status: Abnormal   Collection Time: 01/05/21 12:53 PM   Specimen: Urine, Clean Catch  Result Value Ref Range Status   Specimen Description   Final    URINE, CLEAN CATCH Performed at [facility], [address] [place],  [address]    Special Requests   Final    NONE Performed at [facility], [address] [place], [place] [address]    Culture MULTIPLE SPECIES PRESENT, SUGGEST RECOLLECTION (A)  Final   Report Status 01/06/2021 FINAL  Final  Blood culture (routine x 2)     Status: None (Preliminary result)   Collection Time: 01/05/21 12:53 PM   Specimen: BLOOD  Result Value Ref Range Status   Specimen Description   Final    BLOOD LEFT ANTECUBITAL Performed at [facility], [address] [place], [place] [address]    Special Requests   Final    BOTTLES DRAWN AEROBIC AND ANAEROBIC Blood Culture adequate volume Performed at [facility], [address] [place], [place] [address]    Culture   Final    NO GROWTH 2 DAYS Performed at  [facility], [place] [address], [place], [place] [address]    Report Status PENDING  Incomplete  Blood culture (routine x 2)     Status: None (Preliminary result)   Collection Time: 01/05/21  3:25 PM   Specimen: BLOOD LEFT FOREARM  Result Value Ref Range Status   Specimen Description   Final    BLOOD LEFT FOREARM Performed at [facility], [place] [address], [place], [place] [address]    Special Requests   Final    BOTTLES DRAWN AEROBIC AND ANAEROBIC Blood Culture adequate volume Performed at [facility], [address] [place], [place] [address]    Culture   Final    NO GROWTH 2 DAYS Performed at [facility], [place] [address] [place], [place] [address]    Report Status PENDING  Incomplete  Resp Panel by RT-PCR (Flu A&B, Covid) Nasopharyngeal Swab     Status: None   Collection Time: 01/05/21  3:55 PM   Specimen: Nasopharyngeal Swab; Nasopharyngeal(NP) swabs in vial transport medium  Result Value Ref Range Status   SARS Coronavirus 2 by RT PCR NEGATIVE NEGATIVE Final    Comment: (NOTE) SARS-CoV-2 target nucleic acids are NOT DETECTED.  The SARS-CoV-2 RNA is generally detectable in upper respiratory specimens during the acute phase of infection. The lowest concentration of SARS-CoV-2 viral copies this assay can detect is 138 copies/mL. A negative result does not preclude SARS-Cov-2 infection and should not be used as the sole basis for treatment or other patient management decisions. A negative result may occur with  improper specimen collection/handling, submission of specimen other than nasopharyngeal swab, presence of viral mutation(s) within the areas targeted by this assay, and inadequate number of viral copies(<138 copies/mL). A negative result must be combined with clinical observations, patient history, and epidemiological information. The expected  result is Negative.  Fact Sheet for Patients:  EntrepreneurPulse.com.au  Fact Sheet for Healthcare Providers:  IncredibleEmployment.be  This test is no t yet approved or cleared by the [place] FDA and  has been authorized for detection and/or diagnosis of SARS-CoV-2 by FDA under an Emergency Use Authorization (EUA). This EUA will remain  in effect (meaning this test can be used) for the duration of the COVID-19 declaration under Section 564(b)(1) of the Act, 21 U.S.C.section 360bbb-3(b)(1), unless the authorization is terminated  or revoked sooner.       Influenza A by PCR NEGATIVE NEGATIVE Final   Influenza B by PCR NEGATIVE NEGATIVE Final    Comment: (NOTE) The Xpert Xpress SARS-CoV-2/FLU/RSV plus assay is intended as an aid in the diagnosis of influenza from Nasopharyngeal swab specimens and should not be used as a sole basis for treatment. Nasal washings and aspirates are unacceptable for Xpert Xpress SARS-CoV-2/FLU/RSV testing.  Fact Sheet for Patients: EntrepreneurPulse.com.au  Fact Sheet for Healthcare Providers: IncredibleEmployment.be  This test is not yet approved or cleared by the [place] FDA and has been authorized for detection and/or diagnosis of SARS-CoV-2 by FDA under an Emergency Use Authorization (EUA). This EUA will remain in effect (meaning this test can be used) for the duration of the COVID-19 declaration under Section 564(b)(1) of the Act, 21 U.S.C. section 360bbb-3(b)(1), unless the authorization is terminated or revoked.  Performed at [facility], [address], [place], [place] [address]   MRSA PCR Screening     Status: None   Collection Time: 01/06/21  1:39 AM   Specimen: Nasopharyngeal  Result Value  Ref Range Status   MRSA by PCR NEGATIVE NEGATIVE Final    Comment:        The GeneXpert MRSA Assay (FDA approved for NASAL specimens only), is one  component of a comprehensive MRSA colonization surveillance program. It is not intended to diagnose MRSA infection nor to guide or monitor treatment for MRSA infections. Performed at [facility], [place] [address] [place], [place] [address]      Labs: BNP (last 3 results) Recent Labs    11/18/20 1042 01/05/21 1320  BNP 135.1* 924.2*   Basic Metabolic Panel: Recent Labs  Lab 01/05/21 1253 01/05/21 1358 01/05/21 1815 01/06/21 0737 01/07/21 0222  NA 141 139 139 143 139  K 3.5 3.6 3.5 3.3* 3.0*  CL 91*  --   --  92* 91*  CO2 37*  --   --  37* 36*  GLUCOSE 185*  --   --  166* 192*  BUN 22  --   --  23 20  CREATININE 0.87  --   --  0.83 0.70  CALCIUM 9.2  --   --  8.8* 8.4*  MG  --   --   --   --  1.6*   Liver Function Tests: Recent Labs  Lab 01/05/21 1253  AST 17  ALT 16  ALKPHOS 40  BILITOT 0.5  PROT 7.1  ALBUMIN 4.2   Recent Labs  Lab 01/05/21 1253  LIPASE 27   Recent Labs  Lab 01/05/21 1320  AMMONIA 12   CBC: Recent Labs  Lab 01/05/21 1253 01/05/21 1358 01/05/21 1815 01/06/21 0737 01/07/21 0222  WBC 10.5  --   --  10.0 10.1  NEUTROABS 8.9*  --   --   --  7.7  HGB 11.7* 11.2* 10.9* 11.1* 10.1*  HCT 35.2* 33.0* 32.0* 33.9* 30.9*  MCV 98.1  --   --  99.7 97.8  PLT 340  --   --  324 278   Cardiac Enzymes: No results for input(s): CKTOTAL, CKMB, CKMBINDEX, TROPONINI in the last 168 hours. BNP: Invalid input(s): POCBNP CBG: Recent Labs  Lab 01/06/21 1158 01/06/21 1706 01/06/21 2125 01/07/21 0814 01/07/21 1206  GLUCAP 287* 192* 169* 191* 165*   D-Dimer No results for input(s): DDIMER in the last 72 hours. Hgb A1c Recent Labs    01/06/21 0737  HGBA1C 6.8*   Lipid Profile No results for input(s): CHOL, HDL, LDLCALC, TRIG, CHOLHDL, LDLDIRECT in the last 72 hours. Thyroid function studies Recent Labs    01/05/21 1542  TSH 0.706   Anemia work up No results for input(s): VITAMINB12, FOLATE, FERRITIN, TIBC,  IRON, RETICCTPCT in the last 72 hours. Urinalysis    Component Value Date/Time   COLORURINE YELLOW 01/05/2021 1253   APPEARANCEUR CLEAR 01/05/2021 1253   LABSPEC 1.025 01/05/2021 1253   PHURINE 5.0 01/05/2021 1253   GLUCOSEU NEGATIVE 01/05/2021 1253   HGBUR SMALL (A) 01/05/2021 1253   BILIRUBINUR NEGATIVE 01/05/2021 1253   KETONESUR NEGATIVE 01/05/2021 1253   PROTEINUR NEGATIVE 01/05/2021 1253   NITRITE NEGATIVE 01/05/2021 1253   LEUKOCYTESUR NEGATIVE 01/05/2021 1253   Sepsis Labs Invalid input(s): PROCALCITONIN,  WBC,  LACTICIDVEN Microbiology Recent Results (from the past 240 hour(s))  Urine culture     Status: Abnormal   Collection Time: 01/05/21 12:53 PM   Specimen: Urine, Clean Catch  Result Value Ref Range Status   Specimen Description   Final    URINE, CLEAN CATCH Performed at [facility], [address] [place], [place] [address]    Special Requests   Final    NONE Performed at [facility], [address] [place], [place] [address]    Culture MULTIPLE SPECIES PRESENT, SUGGEST RECOLLECTION (A)  Final   Report Status 01/06/2021 FINAL  Final  Blood culture (routine x 2)     Status: None (Preliminary result)   Collection Time: 01/05/21 12:53 PM   Specimen: BLOOD  Result Value Ref Range Status   Specimen Description   Final    BLOOD LEFT ANTECUBITAL Performed at [facility], [address] [place], [place] [address]    Special Requests   Final    BOTTLES DRAWN AEROBIC AND ANAEROBIC Blood Culture adequate volume Performed at [facility], [address] [place], [place] [address]    Culture   Final    NO GROWTH 2 DAYS Performed at [facility], [place] [address], [place], [place] [address]    Report Status PENDING  Incomplete  Blood culture (routine x 2)     Status: None (Preliminary result)   Collection Time: 01/05/21  3:25 PM   Specimen: BLOOD LEFT FOREARM  Result Value Ref Range Status   Specimen  Description   Final    BLOOD LEFT FOREARM Performed at [facility], [place] [address], [place], [place] [address]    Special Requests   Final    BOTTLES DRAWN AEROBIC AND ANAEROBIC Blood Culture adequate volume Performed at [facility], [address] [place], [place] [address]    Culture   Final    NO GROWTH 2 DAYS Performed at [facility], [place] [address] [place], [place] [address]    Report Status PENDING  Incomplete  Resp Panel by RT-PCR (Flu A&B, Covid) Nasopharyngeal Swab     Status: None   Collection Time: 01/05/21  3:55 PM   Specimen: Nasopharyngeal Swab; Nasopharyngeal(NP) swabs in vial transport medium  Result Value Ref Range Status   SARS Coronavirus 2 by RT PCR NEGATIVE NEGATIVE Final    Comment: (NOTE) SARS-CoV-2 target nucleic acids are NOT DETECTED.  The SARS-CoV-2 RNA is generally detectable in upper respiratory specimens during the acute phase of infection. The lowest concentration of SARS-CoV-2 viral copies this assay can detect is 138 copies/mL. A negative result does not preclude SARS-Cov-2 infection and should not be used as the sole basis for treatment or other patient management decisions. A negative result may occur with  improper specimen collection/handling, submission of specimen other than nasopharyngeal swab, presence of viral mutation(s) within the areas targeted by this assay, and inadequate number of viral copies(<138 copies/mL). A negative result must be combined with clinical observations, patient history, and epidemiological information. The expected result is Negative.  Fact Sheet for Patients:  EntrepreneurPulse.com.au  Fact Sheet for Healthcare Providers:  IncredibleEmployment.be  This test is no t yet approved or cleared by the [place] FDA and  has been authorized for detection and/or diagnosis of SARS-CoV-2 by FDA under an Emergency Use Authorization (EUA). This EUA will  remain  in effect (meaning this test can be used) for the duration of the COVID-19 declaration under Section 564(b)(1) of the Act, 21 U.S.C.section 360bbb-3(b)(1), unless the authorization is terminated  or revoked sooner.       Influenza A by PCR NEGATIVE NEGATIVE Final   Influenza B by PCR NEGATIVE NEGATIVE Final    Comment: (NOTE) The Xpert  Xpress SARS-CoV-2/FLU/RSV plus assay is intended as an aid in the diagnosis of influenza from Nasopharyngeal swab specimens and should not be used as a sole basis for treatment. Nasal washings and aspirates are unacceptable for Xpert Xpress SARS-CoV-2/FLU/RSV testing.  Fact Sheet for Patients: EntrepreneurPulse.com.au  Fact Sheet for Healthcare Providers: IncredibleEmployment.be  This test is not yet approved or cleared by the [place] FDA and has been authorized for detection and/or diagnosis of SARS-CoV-2 by FDA under an Emergency Use Authorization (EUA). This EUA will remain in effect (meaning this test can be used) for the duration of the COVID-19 declaration under Section 564(b)(1) of the Act, 21 U.S.C. section 360bbb-3(b)(1), unless the authorization is terminated or revoked.  Performed at [facility], [address] [place], [place] [id number]   MRSA PCR Screening     Status: None   Collection Time: 01/06/21  1:39 AM   Specimen: Nasopharyngeal  Result Value Ref Range Status   MRSA by PCR NEGATIVE NEGATIVE Final    Comment:        The GeneXpert MRSA Assay (FDA approved for NASAL specimens only), is one component of a comprehensive MRSA colonization surveillance program. It is not intended to diagnose MRSA infection nor to guide or monitor treatment for MRSA infections. Performed at [facility], [place] [address], [place], [place] [address]     Procedures/Studies: CT Head Wo Contrast  Result Date: 01/05/2021 CLINICAL DATA:  Head trauma. Confusion.  Head trauma November time point EXAM: CT HEAD WITHOUT CONTRAST TECHNIQUE: Contiguous axial images were obtained from the base of the skull through the vertex without intravenous contrast. COMPARISON:  None. FINDINGS: Brain: No acute intracranial hemorrhage. No focal mass lesion. No CT evidence of acute infarction. No midline shift or mass effect. No hydrocephalus. Basilar cisterns are patent. There are periventricular and subcortical white matter hypodensities. Generalized cortical atrophy. Vascular: No hyperdense vessel or unexpected calcification. Skull: Normal. Negative for fracture or focal lesion. Sinuses/Orbits: Chronic opacification of the RIGHT maxillary sinus with sinus wall thickening. Orbits are normal. Other: None. IMPRESSION: 1. No acute intracranial findings. 2. Atrophy and white matter microvascular disease. 3. Chronic RIGHT maxillary sinusitis. Electronically Signed   By: Suzy Bouchard M.D.   On: 01/05/2021 12:14   DG Chest Portable 1 View  Result Date: 01/05/2021 CLINICAL DATA:  Per family has had confusion, weakness since after thanksgiving. Per son he fell during the night hitting the back of his head. Per son was seen by ems and told there was an 8 hour wait in all of the Ed's so he should wait until today. Head CT today HX of htn and diabetes EXAM: PORTABLE CHEST 1 VIEW COMPARISON:  11/18/2020 FINDINGS: Stable changes from prior cardiac surgery. Cardiac silhouette top-normal in size. No mediastinal or hilar masses. Mild lung base opacities consistent with scarring or atelectasis, accentuated by low lung volumes, similar to the prior study. No convincing pneumonia and no evidence of pulmonary edema. No pleural effusion or pneumothorax. Skeletal structures are grossly intact. IMPRESSION: No acute cardiopulmonary disease. Electronically Signed   By: Lajean Manes M.D.   On: 01/05/2021 13:43     Time coordinating discharge: Over 30 minutes    Dwyane Dee, MD  [facility] 01/07/2021, 4:19 PM

## 2021-01-10 LAB — CULTURE, BLOOD (ROUTINE X 2)
Culture: NO GROWTH
Culture: NO GROWTH
Special Requests: ADEQUATE
Special Requests: ADEQUATE

## 2021-01-16 ENCOUNTER — Other Ambulatory Visit: Payer: Medicare Other | Admitting: Internal Medicine

## 2021-01-16 ENCOUNTER — Other Ambulatory Visit: Payer: Medicare Other | Admitting: Hospice

## 2021-01-30 DEATH — deceased

## 2022-09-29 IMAGING — DX DG CHEST 1V PORT
1 series · 1 of 1 positions shown · non-contrast
Comparison: 11/18/2020

CLINICAL DATA: Per family has had confusion, weakness since after
thanksgiving. Per son he fell during the night hitting the back of
his head. Per son was seen by ems and told there was an 8 hour wait
in all of the Ed's so he should wait until today. Head CT today HX
of htn and diabetes

EXAM:
PORTABLE CHEST 1 VIEW

[chest ap]
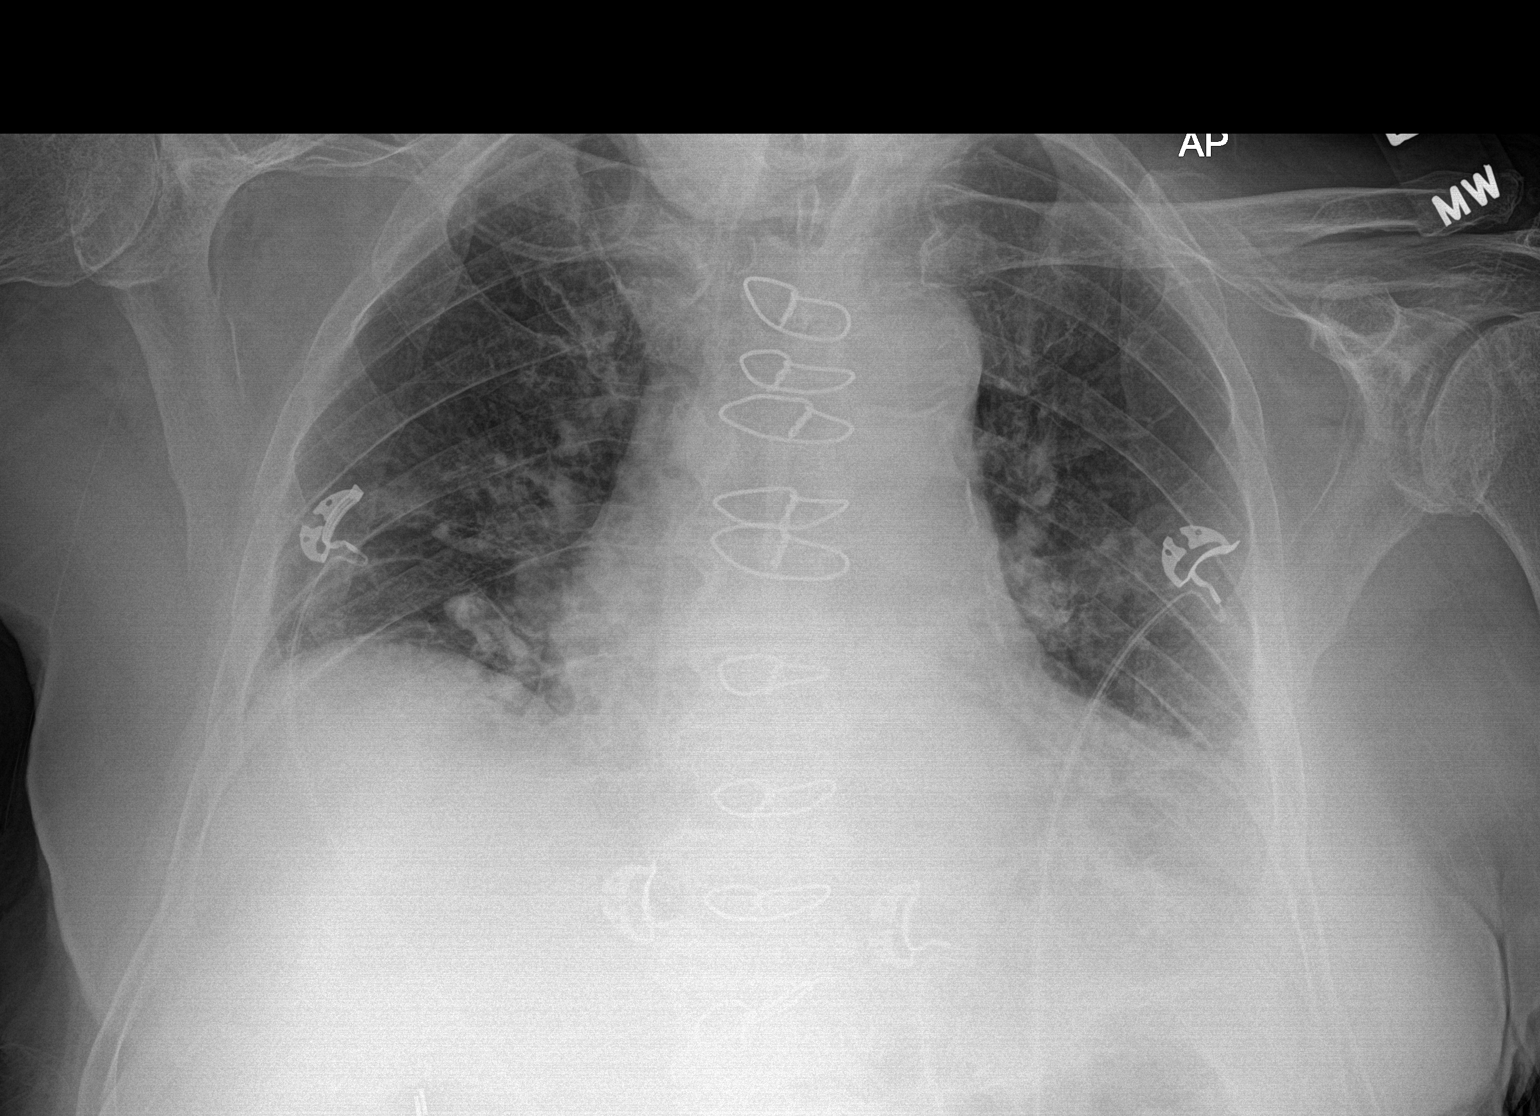

[1 of 1 positions shown; findings below may reference images not displayed]

FINDINGS: Stable changes from prior cardiac surgery. Cardiac silhouette
top-normal in size. No mediastinal or hilar masses.

Mild lung base opacities consistent with scarring or atelectasis,
accentuated by low lung volumes, similar to the prior study. No
convincing pneumonia and no evidence of pulmonary edema.

No pleural effusion or pneumothorax.

Skeletal structures are grossly intact.
IMPRESSION: No acute cardiopulmonary disease.
